# Patient Record
Sex: Female | Born: 1960
Health system: Southern US, Community
[De-identification: ages and names within clinical notes are randomized; demographics above are authoritative.]

## PROBLEM LIST (undated history)

## (undated) DIAGNOSIS — J45909 Unspecified asthma, uncomplicated: Secondary | ICD-10-CM

## (undated) DIAGNOSIS — I10 Essential (primary) hypertension: Secondary | ICD-10-CM

## (undated) DIAGNOSIS — K602 Anal fissure, unspecified: Secondary | ICD-10-CM

## (undated) DIAGNOSIS — E079 Disorder of thyroid, unspecified: Secondary | ICD-10-CM

## (undated) DIAGNOSIS — K649 Unspecified hemorrhoids: Secondary | ICD-10-CM

## (undated) DIAGNOSIS — E039 Hypothyroidism, unspecified: Secondary | ICD-10-CM

## (undated) DIAGNOSIS — Z87442 Personal history of urinary calculi: Secondary | ICD-10-CM

## (undated) DIAGNOSIS — J189 Pneumonia, unspecified organism: Secondary | ICD-10-CM

## (undated) DIAGNOSIS — Z789 Other specified health status: Secondary | ICD-10-CM

## (undated) DIAGNOSIS — R112 Nausea with vomiting, unspecified: Secondary | ICD-10-CM

## (undated) DIAGNOSIS — Z9889 Other specified postprocedural states: Secondary | ICD-10-CM

## (undated) DIAGNOSIS — M1990 Unspecified osteoarthritis, unspecified site: Secondary | ICD-10-CM

## (undated) HISTORY — DX: Unspecified osteoarthritis, unspecified site: M19.90

## (undated) HISTORY — DX: Essential (primary) hypertension: I10

## (undated) HISTORY — PX: TUBAL LIGATION: SHX77

## (undated) HISTORY — DX: Unspecified hemorrhoids: K64.9

## (undated) HISTORY — DX: Disorder of thyroid, unspecified: E07.9

## (undated) HISTORY — PX: COLONOSCOPY: SHX174

## (undated) HISTORY — DX: Anal fissure, unspecified: K60.2

---

## 1998-02-21 ENCOUNTER — Emergency Department (HOSPITAL_COMMUNITY): Admission: EM | Admit: 1998-02-21 | Discharge: 1998-02-21 | Payer: Self-pay | Admitting: Emergency Medicine

## 2001-09-10 ENCOUNTER — Other Ambulatory Visit: Admission: RE | Admit: 2001-09-10 | Discharge: 2001-09-10 | Payer: Self-pay | Admitting: Family Medicine

## 2002-06-28 ENCOUNTER — Encounter: Admission: RE | Admit: 2002-06-28 | Discharge: 2002-06-28 | Payer: Self-pay | Admitting: Family Medicine

## 2002-06-28 ENCOUNTER — Encounter: Payer: Self-pay | Admitting: Family Medicine

## 2002-08-30 ENCOUNTER — Encounter (INDEPENDENT_AMBULATORY_CARE_PROVIDER_SITE_OTHER): Payer: Self-pay

## 2002-08-30 ENCOUNTER — Inpatient Hospital Stay (HOSPITAL_COMMUNITY): Admission: RE | Admit: 2002-08-30 | Discharge: 2002-09-01 | Payer: Self-pay | Admitting: Obstetrics and Gynecology

## 2002-08-30 HISTORY — PX: TOTAL ABDOMINAL HYSTERECTOMY: SHX209

## 2002-08-30 HISTORY — PX: ABDOMINAL HYSTERECTOMY: SHX81

## 2003-04-23 ENCOUNTER — Other Ambulatory Visit: Admission: RE | Admit: 2003-04-23 | Discharge: 2003-04-23 | Payer: Self-pay | Admitting: Family Medicine

## 2004-11-04 ENCOUNTER — Other Ambulatory Visit: Admission: RE | Admit: 2004-11-04 | Discharge: 2004-11-04 | Payer: Self-pay | Admitting: Obstetrics & Gynecology

## 2006-09-13 ENCOUNTER — Encounter: Admission: RE | Admit: 2006-09-13 | Discharge: 2006-09-13 | Payer: Self-pay | Admitting: Physician Assistant

## 2007-06-11 ENCOUNTER — Ambulatory Visit: Payer: Self-pay | Admitting: Family Medicine

## 2007-06-11 DIAGNOSIS — J45901 Unspecified asthma with (acute) exacerbation: Secondary | ICD-10-CM | POA: Insufficient documentation

## 2007-06-11 DIAGNOSIS — E669 Obesity, unspecified: Secondary | ICD-10-CM | POA: Insufficient documentation

## 2007-06-11 DIAGNOSIS — J45909 Unspecified asthma, uncomplicated: Secondary | ICD-10-CM | POA: Insufficient documentation

## 2007-06-11 DIAGNOSIS — I10 Essential (primary) hypertension: Secondary | ICD-10-CM | POA: Insufficient documentation

## 2007-06-11 DIAGNOSIS — Z87448 Personal history of other diseases of urinary system: Secondary | ICD-10-CM | POA: Insufficient documentation

## 2007-06-12 ENCOUNTER — Encounter: Payer: Self-pay | Admitting: Family Medicine

## 2007-09-12 ENCOUNTER — Telehealth: Payer: Self-pay | Admitting: Family Medicine

## 2007-12-03 ENCOUNTER — Encounter: Payer: Self-pay | Admitting: Family Medicine

## 2007-12-07 ENCOUNTER — Ambulatory Visit: Payer: Self-pay | Admitting: Family Medicine

## 2007-12-07 DIAGNOSIS — E063 Autoimmune thyroiditis: Secondary | ICD-10-CM | POA: Insufficient documentation

## 2008-01-31 ENCOUNTER — Encounter: Payer: Self-pay | Admitting: Family Medicine

## 2008-03-13 ENCOUNTER — Encounter: Payer: Self-pay | Admitting: Family Medicine

## 2008-05-29 ENCOUNTER — Telehealth: Payer: Self-pay | Admitting: Family Medicine

## 2008-07-24 ENCOUNTER — Encounter: Payer: Self-pay | Admitting: Family Medicine

## 2008-10-23 ENCOUNTER — Encounter: Payer: Self-pay | Admitting: Family Medicine

## 2009-05-15 ENCOUNTER — Encounter: Payer: Self-pay | Admitting: Family Medicine

## 2009-05-19 LAB — CONVERTED CEMR LAB
HDL: 44 mg/dL
HDL: 44 mg/dL
HDL: 44 mg/dL
HDL: 44 mg/dL
LDL Cholesterol: 120 mg/dL
LDL Cholesterol: 120 mg/dL
LDL Cholesterol: 120 mg/dL
LDL Cholesterol: 120 mg/dL

## 2009-05-20 ENCOUNTER — Ambulatory Visit: Payer: Self-pay | Admitting: Family Medicine

## 2009-05-20 DIAGNOSIS — R030 Elevated blood-pressure reading, without diagnosis of hypertension: Secondary | ICD-10-CM | POA: Insufficient documentation

## 2009-05-20 DIAGNOSIS — E559 Vitamin D deficiency, unspecified: Secondary | ICD-10-CM | POA: Insufficient documentation

## 2009-05-20 DIAGNOSIS — J309 Allergic rhinitis, unspecified: Secondary | ICD-10-CM | POA: Insufficient documentation

## 2009-05-20 LAB — CONVERTED CEMR LAB

## 2009-05-22 ENCOUNTER — Encounter: Payer: Self-pay | Admitting: Family Medicine

## 2010-05-26 ENCOUNTER — Encounter (INDEPENDENT_AMBULATORY_CARE_PROVIDER_SITE_OTHER): Payer: Self-pay | Admitting: *Deleted

## 2010-05-26 ENCOUNTER — Encounter: Payer: Self-pay | Admitting: Family Medicine

## 2010-08-17 NOTE — Letter (Signed)
Summary: Results Follow up Letter  [place] [facility]  [address] [place], [place] [address]   Phone: [phone]  Fax: [phone]    05/26/2010 MRN: [id number]      Kaitlyn Kelly [address] [place], [place]  [address]     Dear [name],  The following are the results of your recent test(s):  Test         Result    Pap Smear:        Normal _____  Not Normal _____ Comments: ______________________________________________________ Cholesterol: LDL(Bad cholesterol):         Your goal is less than:         HDL (Good cholesterol):       Your goal is more than: Comments:  ______________________________________________________ Mammogram:        Normal ___x__  Not Normal _____ Comments:Repeat in 1 year  ___________________________________________________________________ Hemoccult:        Normal _____  Not normal _______ Comments:    _____________________________________________________________________ Other Tests:    We routinely do not discuss normal results over the telephone.  If you desire a copy of the results, or you have any questions about this information we can discuss them at your next office visit.   Sincerely,  Kerby Nora MD

## 2010-12-03 NOTE — Op Note (Signed)
NAME:  Kaitlyn Kelly, Kaitlyn Kelly                        ACCOUNT NO.:  [id number]   MEDICAL RECORD NO.:  [id number]                   PATIENT TYPE:  INP   LOCATION:  9136                                 FACILITY:  [facility]   PHYSICIAN:  Michelle L. Vincente Poli, M.D.            DATE OF BIRTH:  Dec 30, 1960   DATE OF PROCEDURE:  08/30/2002  DATE OF DISCHARGE:  09/01/2002                                 OPERATIVE REPORT   PREOPERATIVE DIAGNOSES:  1. Pelvic pain.  2. Menometrorrhagia.  3. History of right ovarian cyst.   POSTOPERATIVE DIAGNOSIS:  1. Pelvic pain.  2. Menometrorrhagia.  3. History of right ovarian cyst.   PROCEDURE:  Exploratory laparotomy, total abdominal hysterectomy, right  salpingo-oophorectomy, and removal of left ovarian cyst.   SURGEON:  Michelle L. Vincente Poli, M.D.   ASSISTANT:  Guy Sandifer. Henderson Cloud, M.D.   ANESTHESIA:  General.   ESTIMATED BLOOD LOSS:  200 mL.   DESCRIPTION OF PROCEDURE:  After informed consent was obtained, the patient  was taken to the operating room.  She was then intubated without difficulty.  The abdomen and vagina were prepped and draped in the usual sterile fashion.  A Foley catheter was inserted into the bladder.  A low transverse incision  was made, carried down to the fascia; the fascia scored in the midline and  extended laterally.  A Pfannenstiel incision was then developed.  The rectus  muscles were separated and the peritoneum was entered sharply.  The  peritoneal incision was then extended and a self-retaining retractor was  placed in the abdominal cavity.  The large and small bowel were placed in  the upper abdomen and packed appropriately.  Exam of the pelvis revealed a  slightly enlarged uterus.  There were no adhesions, no endometriosis seen.  There was a small corpus luteum cyst on the left ovary which was then  removed using electrocautery.  We then proceeded with the TAH/RSO in the  following fashion:  The Kelly clamps were used to elevate  the triple  pedicles and the round ligament was then suture ligated using a Vicryl  suture.  It was transected using electrocautery and the anterior and  posterior leaves of the broad ligament were then incised down to the level  of the internal os and cervix.  An avascular window was created beneath the  IP ligament on the right and a curve Heaney clamp was placed across the IP  ligament.  Careful attention was performed so that the ureter was well below  our clamps.  The pedicle was cut and suture ligated using a Vicryl suture  and then further secured using a free tie of 0 Vicryl.  We then skeletonized  the uterine artery, dissected the bladder free from the anterior surface of  the cervix, and the uterine artery was clamped on the right side at the  level of the internal os using a curved Heaney clamp.  Attention was then  turned to the left side of the pelvis where in a likewise fashion the round  ligament was then suture ligated using a Vicryl suture.  It was transected  using electrocautery and the anterior/posterior leaves of the broad ligament  were then incised.  The avascular window was created beneath the triple  pedicle.  A curved Heaney clamp was placed across this and the pedicle was  cut and suture ligated using 0 Vicryl suture.  The uterine artery was  likewise skeletonized and the curved Heaney clamp was placed across the  uterine artery at the level of the internal os.  The pedicle was cut and  suture ligated using 0 Vicryl suture.  The uterine artery clamped on the  right side; pedicle was cut and suture ligated using 0 Vicryl suture.  The  remainder of the hysterectomy was completed using straight clamps that were  placed beside the cervix, and clamping the cardinal ligament on either side  each pedicle was cut using a scalpel and then suture ligated using 0 Vicryl  suture.  Careful attention was performed to ensure that the bladder was well  below our clamps.  Once we  reached the level of the external os, curved  Heaney clamps were placed just below the cervix and the pedicles were cut,  and the remainder of the specimen was removed using scissors.  The angle  stitches were then placed using 0 Vicryl suture, and the remainder of the  cuff was closed in a running stitch using 0 Vicryl in a continuous running  locked stitch.  Irrigation was performed; hemostasis was noted.  All  pedicles were inspected and noted to be hemostatic.  The appendix was  inspected and noted to be normal.  The left ovary was transfixed to the  round ligament using a stitch of 0 Vicryl suture because it had a natural  tendency to lie on the cuff.  All instruments and laparotomy pads were  removed from the abdomen, and after all instruments were removed from the  abdominal cavity the peritoneum was closed using 0 Vicryl in continuous  running stitch, and the rectus muscles were reapproximated using the same  0 Vicryl.  The fascia was closed using 0 Vicryl in continuous running  stitch, starting at each corner and meeting in the midline.  After  irrigation of the subcutaneous layer, the subcutaneous layer was closed  using 0 plain gut suture interrupted, and the skin was closed with staples.  All sponge, lap, and instrument counts were correct x2.  The patient  tolerated the procedure well and went to the recovery room in stable  condition.                                               Michelle L. Vincente Poli, M.D.    Florestine Avers  D:  09/02/2002  T:  09/02/2002  Job:  [id number]

## 2010-12-03 NOTE — Discharge Summary (Signed)
   NAME:  VENBA, ZENNER                        ACCOUNT NO.:  [id number]   MEDICAL RECORD NO.:  [id number]                   PATIENT TYPE:  INP   LOCATION:  9136                                 [facility]ILITY:  [facility]   PHYSICIAN:  Michelle L. Vincente Poli, M.D.            DATE OF BIRTH:  02-02-1961   DATE OF ADMISSION:  08/30/2002  DATE OF DISCHARGE:  09/01/2002                                 DISCHARGE SUMMARY   HOSPITAL COURSE:  The patient is a 50 year old female who presents  preoperatively for TAH/RSO.  On the day of admission she undergoes a TAH/RSO  without incident.  She does very well postoperatively.  The patient is  discharged home on postoperative day #2.  Her postoperative day #1  hemoglobin was 10.  Throughout her postoperative stay she had good pain  control, she was voiding, and by postoperative day #2 was ambulating without  difficulty, had flatus, and was tolerating a regular diet.  She remained  afebrile with stable vital signs throughout her postoperative stay.  The  patient was discharged home on postoperative day #2.  Her staples were kept  in because there was a small amount of redness around her incision.   DISCHARGE MEDICATIONS:  She was given ciprofloxacin 250 mg b.i.d. for seven  days, Tylox and ibuprofen to take as needed for pain.   DISCHARGE INSTRUCTIONS:  1. She will follow up in the office in two days time to removal her staples     and for an incision exam.  2. She was advised no driving for one week.  3. She was advised to call if she has any nausea, vomiting, severe abdominal     pain, temperature greater than 100.5, redness or drainage or separation     from her incision site.                                               Michelle L. Vincente Poli, M.D.    Florestine Avers  D:  09/02/2002  T:  09/02/2002  Job:  [id number]

## 2011-02-17 ENCOUNTER — Ambulatory Visit: Payer: Self-pay | Admitting: Pediatrics

## 2011-03-31 ENCOUNTER — Emergency Department (HOSPITAL_COMMUNITY): Payer: 59

## 2011-03-31 ENCOUNTER — Emergency Department (HOSPITAL_COMMUNITY)
Admission: EM | Admit: 2011-03-31 | Discharge: 2011-03-31 | Disposition: A | Discharge status: Home / Self Care | Payer: 59 | Attending: Emergency Medicine | Admitting: Emergency Medicine

## 2011-03-31 DIAGNOSIS — R109 Unspecified abdominal pain: Secondary | ICD-10-CM | POA: Insufficient documentation

## 2011-03-31 DIAGNOSIS — I10 Essential (primary) hypertension: Secondary | ICD-10-CM | POA: Insufficient documentation

## 2011-03-31 DIAGNOSIS — K8020 Calculus of gallbladder without cholecystitis without obstruction: Secondary | ICD-10-CM | POA: Insufficient documentation

## 2011-03-31 DIAGNOSIS — N201 Calculus of ureter: Secondary | ICD-10-CM | POA: Insufficient documentation

## 2011-03-31 LAB — POCT I-STAT, CHEM 8
BUN: 13 mg/dL (ref 6–23)
Calcium, Ion: 1.12 mmol/L (ref 1.12–1.32)
Chloride: 107 mEq/L (ref 96–112)
HCT: 46 % (ref 36.0–46.0)
Potassium: 4.2 mEq/L (ref 3.5–5.1)
Sodium: 142 mEq/L (ref 135–145)

## 2011-03-31 LAB — URINALYSIS, ROUTINE W REFLEX MICROSCOPIC
Bilirubin Urine: NEGATIVE
Glucose, UA: NEGATIVE mg/dL
Ketones, ur: NEGATIVE mg/dL
Leukocytes, UA: NEGATIVE
Nitrite: NEGATIVE
Protein, ur: 30 mg/dL — AB
Specific Gravity, Urine: 1.022 (ref 1.005–1.030)
Urobilinogen, UA: 0.2 mg/dL (ref 0.0–1.0)
pH: 7 (ref 5.0–8.0)

## 2011-03-31 LAB — POCT PREGNANCY, URINE: Preg Test, Ur: NEGATIVE

## 2011-03-31 LAB — URINE MICROSCOPIC-ADD ON

## 2011-06-02 ENCOUNTER — Other Ambulatory Visit: Payer: Self-pay | Admitting: *Deleted

## 2011-06-02 DIAGNOSIS — E049 Nontoxic goiter, unspecified: Secondary | ICD-10-CM

## 2011-07-20 ENCOUNTER — Encounter: Payer: Self-pay | Admitting: *Deleted

## 2011-08-19 ENCOUNTER — Other Ambulatory Visit (HOSPITAL_COMMUNITY): Payer: Self-pay | Admitting: Internal Medicine

## 2011-08-19 ENCOUNTER — Other Ambulatory Visit: Payer: Self-pay | Admitting: Internal Medicine

## 2011-08-19 DIAGNOSIS — E039 Hypothyroidism, unspecified: Secondary | ICD-10-CM

## 2011-08-22 ENCOUNTER — Ambulatory Visit (HOSPITAL_COMMUNITY)
Admission: RE | Admit: 2011-08-22 | Discharge: 2011-08-22 | Disposition: A | Discharge status: Home / Self Care | Payer: 59 | Source: Ambulatory Visit | Attending: Internal Medicine | Admitting: Internal Medicine

## 2011-08-22 DIAGNOSIS — E042 Nontoxic multinodular goiter: Secondary | ICD-10-CM | POA: Insufficient documentation

## 2011-08-22 DIAGNOSIS — E039 Hypothyroidism, unspecified: Secondary | ICD-10-CM

## 2011-08-25 ENCOUNTER — Other Ambulatory Visit: Payer: Self-pay | Admitting: Internal Medicine

## 2011-08-25 DIAGNOSIS — E041 Nontoxic single thyroid nodule: Secondary | ICD-10-CM

## 2011-08-30 ENCOUNTER — Ambulatory Visit
Admission: RE | Admit: 2011-08-30 | Discharge: 2011-08-30 | Disposition: A | Discharge status: Home / Self Care | Payer: 59 | Source: Ambulatory Visit | Attending: Internal Medicine | Admitting: Internal Medicine

## 2011-08-30 ENCOUNTER — Other Ambulatory Visit (HOSPITAL_COMMUNITY)
Admission: RE | Admit: 2011-08-30 | Discharge: 2011-08-30 | Disposition: A | Discharge status: Home / Self Care | Payer: 59 | Source: Ambulatory Visit | Attending: Interventional Radiology | Admitting: Interventional Radiology

## 2011-08-30 DIAGNOSIS — E041 Nontoxic single thyroid nodule: Secondary | ICD-10-CM

## 2011-08-30 DIAGNOSIS — E049 Nontoxic goiter, unspecified: Secondary | ICD-10-CM | POA: Insufficient documentation

## 2011-09-07 ENCOUNTER — Telehealth: Payer: Self-pay | Admitting: *Deleted

## 2011-09-07 NOTE — Telephone Encounter (Signed)
This was a test

## 2012-01-06 ENCOUNTER — Encounter: Payer: Self-pay | Admitting: *Deleted

## 2012-04-27 ENCOUNTER — Other Ambulatory Visit: Payer: Self-pay | Admitting: Dermatology

## 2012-05-16 ENCOUNTER — Telehealth: Payer: Self-pay | Admitting: *Deleted

## 2012-05-16 NOTE — Telephone Encounter (Signed)
Opened as a test.

## 2012-09-01 ENCOUNTER — Other Ambulatory Visit: Payer: Self-pay

## 2012-10-11 ENCOUNTER — Other Ambulatory Visit: Payer: Self-pay | Admitting: Nurse Practitioner

## 2012-10-11 NOTE — Telephone Encounter (Signed)
test

## 2012-10-17 ENCOUNTER — Other Ambulatory Visit: Payer: Self-pay | Admitting: *Deleted

## 2012-10-23 ENCOUNTER — Other Ambulatory Visit: Payer: Self-pay | Admitting: *Deleted

## 2012-10-25 ENCOUNTER — Other Ambulatory Visit: Payer: Self-pay | Admitting: *Deleted

## 2012-10-25 DIAGNOSIS — E038 Other specified hypothyroidism: Secondary | ICD-10-CM

## 2012-10-29 ENCOUNTER — Encounter: Payer: Self-pay | Admitting: *Deleted

## 2012-10-30 ENCOUNTER — Telehealth: Payer: Self-pay | Admitting: *Deleted

## 2012-10-30 NOTE — Telephone Encounter (Signed)
Test

## 2012-10-30 NOTE — Telephone Encounter (Signed)
[email or web address]

## 2012-10-31 ENCOUNTER — Other Ambulatory Visit: Payer: Self-pay | Admitting: *Deleted

## 2012-10-31 ENCOUNTER — Other Ambulatory Visit: Payer: Self-pay | Admitting: "Endocrinology

## 2012-10-31 DIAGNOSIS — E2740 Unspecified adrenocortical insufficiency: Secondary | ICD-10-CM

## 2012-11-07 ENCOUNTER — Other Ambulatory Visit: Payer: Self-pay | Admitting: *Deleted

## 2012-11-16 ENCOUNTER — Other Ambulatory Visit: Payer: Self-pay | Admitting: *Deleted

## 2013-03-29 ENCOUNTER — Encounter: Payer: Self-pay | Admitting: Gastroenterology

## 2013-05-08 ENCOUNTER — Encounter: Payer: 59 | Admitting: Gastroenterology

## 2013-05-23 ENCOUNTER — Other Ambulatory Visit: Payer: Self-pay

## 2013-10-31 ENCOUNTER — Encounter: Payer: Self-pay | Admitting: Gastroenterology

## 2013-12-27 ENCOUNTER — Ambulatory Visit (AMBULATORY_SURGERY_CENTER): Payer: Self-pay

## 2013-12-27 VITALS — Ht 65.5 in | Wt 213.6 lb

## 2013-12-27 DIAGNOSIS — Z1211 Encounter for screening for malignant neoplasm of colon: Secondary | ICD-10-CM

## 2013-12-27 MED ORDER — NA SULFATE-K SULFATE-MG SULF 17.5-3.13-1.6 GM/177ML PO SOLN: ORAL | Status: DC

## 2013-12-27 NOTE — Progress Notes (Signed)
Per pt, no allergies to soy or egg products.Pt not taking any weight loss meds or using  O2 at home. 

## 2014-01-10 ENCOUNTER — Encounter: Payer: Self-pay | Admitting: Gastroenterology

## 2014-01-10 ENCOUNTER — Ambulatory Visit (AMBULATORY_SURGERY_CENTER): Payer: 59 | Admitting: Gastroenterology

## 2014-01-10 VITALS — BP 139/88 | HR 85 | Temp 97.2°F | Resp 17 | Ht 65.5 in | Wt 213.0 lb

## 2014-01-10 DIAGNOSIS — Z1211 Encounter for screening for malignant neoplasm of colon: Secondary | ICD-10-CM

## 2014-01-10 DIAGNOSIS — D126 Benign neoplasm of colon, unspecified: Secondary | ICD-10-CM

## 2014-01-10 MED ORDER — SODIUM CHLORIDE 0.9 % IV SOLN: 500.0000 mL | INTRAVENOUS | Status: DC

## 2014-01-10 NOTE — Op Note (Signed)
[place]  [address] [place], [address]   COLONOSCOPY PROCEDURE REPORT  PATIENT: Kaitlyn, Kelly  MR#: [id number] BIRTHDATE: 01/28/1961 , 52  yrs. old GENDER: Female ENDOSCOPIST: Inda Castle, MD REFERRED BY: PROCEDURE DATE:  01/10/2014 PROCEDURE:   Colonoscopy with biopsy First Screening Colonoscopy - Avg.  risk and is 50 yrs.  old or older Yes.  Prior Negative Screening - Now for repeat screening. N/A  History of Adenoma - Now for follow-up colonoscopy & has been > or = to 3 yrs.  N/A  Polyps Removed Today? Yes. ASA CLASS:   Class II INDICATIONS:average risk screening. MEDICATIONS: MAC sedation, administered by CRNA and Propofol (Diprivan) 280 mg IV  DESCRIPTION OF PROCEDURE:   After the risks benefits and alternatives of the procedure were thoroughly explained, informed consent was obtained.  A digital rectal exam revealed no abnormalities of the rectum.   The LB [id number]  endoscope was introduced through the anus and advanced to the terminal ileum which was intubated for a short distance. No adverse events experienced.   The quality of the prep was excellent using Suprep The instrument was then slowly withdrawn as the colon was fully examined.      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  Biopsies were taken to rule out adenoma.   Daily cecal valve was prominent with some polyploid changes.  Biopsies were taken to rule out adenoma.   The colon mucosa was otherwise normal. The mucosa appeared normal in the terminal ileum.  Retroflexed views revealed no abnormalities. The time to cecum=2 minutes 09 seconds.  Withdrawal time=9 minutes 32 seconds.  The scope was withdrawn and the procedure completed. COMPLICATIONS: There were no complications.  ENDOSCOPIC IMPRESSION: 1.   prominent ileocecal valve-rule out adenomatous changes  RECOMMENDATIONS: Await biopsy results   eSigned:  Inda Castle,  MD 01/10/2014 9:32 AM   cc: I Nena Jordan, MD   PATIENT NAME:  Kaitlyn, Kelly MR#: [id number]

## 2014-01-10 NOTE — Progress Notes (Signed)
Called to room to assist during endoscopic procedure.  Patient ID and intended procedure confirmed with present staff. Received instructions for my participation in the procedure from the performing physician.  

## 2014-01-10 NOTE — Patient Instructions (Signed)
Discharge instructions given with verbal understanding. Handout on polyps. Resume previous medications. YOU HAD AN ENDOSCOPIC PROCEDURE TODAY AT THE [facility]: Refer to the procedure report that was given to you for any specific questions about what was found during the examination.  If the procedure report does not answer your questions, please call your gastroenterologist to clarify.  If you requested that your care partner not be given the details of your procedure findings, then the procedure report has been included in a sealed envelope for you to review at your convenience later.  YOU SHOULD EXPECT: Some feelings of bloating in the abdomen. Passage of more gas than usual.  Walking can help get rid of the air that was put into your GI tract during the procedure and reduce the bloating. If you had a lower endoscopy (such as a colonoscopy or flexible sigmoidoscopy) you may notice spotting of blood in your stool or on the toilet paper. If you underwent a bowel prep for your procedure, then you may not have a normal bowel movement for a few days.  DIET: Your first meal following the procedure should be a light meal and then it is ok to progress to your normal diet.  A half-sandwich or bowl of soup is an example of a good first meal.  Heavy or fried foods are harder to digest and may make you feel nauseous or bloated.  Likewise meals heavy in dairy and vegetables can cause extra gas to form and this can also increase the bloating.  Drink plenty of fluids but you should avoid alcoholic beverages for 24 hours.  ACTIVITY: Your care partner should take you home directly after the procedure.  You should plan to take it easy, moving slowly for the rest of the day.  You can resume normal activity the day after the procedure however you should NOT DRIVE or use heavy machinery for 24 hours (because of the sedation medicines used during the test).    SYMPTOMS TO REPORT IMMEDIATELY: A  gastroenterologist can be reached at any hour.  During normal business hours, 8:30 AM to 5:00 PM Monday through Friday, call [phone].  After hours and on weekends, please call the GI answering service at [phone] who will take a message and have the physician on call contact you.   Following lower endoscopy (colonoscopy or flexible sigmoidoscopy):  Excessive amounts of blood in the stool  Significant tenderness or worsening of abdominal pains  Swelling of the abdomen that is new, acute  Fever of 100F or higher  FOLLOW UP: If any biopsies were taken you will be contacted by phone or by letter within the next 1-3 weeks.  Call your gastroenterologist if you have not heard about the biopsies in 3 weeks.  Our staff will call the home number listed on your records the next business day following your procedure to check on you and address any questions or concerns that you may have at that time regarding the information given to you following your procedure. This is a courtesy call and so if there is no answer at the home number and we have not heard from you through the emergency physician on call, we will assume that you have returned to your regular daily activities without incident.  SIGNATURES/CONFIDENTIALITY: You and/or your care partner have signed paperwork which will be entered into your electronic medical record.  These signatures attest to the fact that that the information above on your After Visit Summary has been   reviewed and is understood.  Full responsibility of the confidentiality of this discharge information lies with you and/or your care-partner. 

## 2014-01-10 NOTE — Progress Notes (Signed)
Procedure ends, to recovery, report given and VSS. 

## 2014-01-13 ENCOUNTER — Telehealth: Payer: Self-pay

## 2014-01-13 NOTE — Telephone Encounter (Signed)
Unable to leave message no voice mail set up. 

## 2014-01-20 ENCOUNTER — Encounter: Payer: Self-pay | Admitting: Gastroenterology

## 2015-07-24 MED FILL — MELOXICAM 7.5 MG TABLET: 7.5 | 90 days supply | Qty: 90 | Fill #0

## 2015-08-17 MED FILL — LEVOTHYROXINE 88 MCG TABLET: 88 | 90 days supply | Qty: 90 | Fill #3

## 2015-08-24 DIAGNOSIS — D2261 Melanocytic nevi of right upper limb, including shoulder: Secondary | ICD-10-CM | POA: Diagnosis not present

## 2015-08-24 DIAGNOSIS — D1801 Hemangioma of skin and subcutaneous tissue: Secondary | ICD-10-CM | POA: Diagnosis not present

## 2015-08-24 DIAGNOSIS — D2262 Melanocytic nevi of left upper limb, including shoulder: Secondary | ICD-10-CM | POA: Diagnosis not present

## 2015-08-24 DIAGNOSIS — D225 Melanocytic nevi of trunk: Secondary | ICD-10-CM | POA: Diagnosis not present

## 2015-09-02 DIAGNOSIS — K625 Hemorrhage of anus and rectum: Secondary | ICD-10-CM | POA: Diagnosis not present

## 2015-09-09 ENCOUNTER — Ambulatory Visit (INDEPENDENT_AMBULATORY_CARE_PROVIDER_SITE_OTHER): Payer: 59 | Admitting: Physician Assistant

## 2015-09-09 ENCOUNTER — Encounter: Payer: Self-pay | Admitting: Physician Assistant

## 2015-09-09 VITALS — BP 120/92 | HR 84 | Ht 65.25 in | Wt 215.2 lb

## 2015-09-09 DIAGNOSIS — K625 Hemorrhage of anus and rectum: Secondary | ICD-10-CM | POA: Diagnosis not present

## 2015-09-09 NOTE — Patient Instructions (Signed)

## 2015-09-09 NOTE — Progress Notes (Signed)
Thank you for sending this case to me. I have reviewed the entire note, and the outlined plan seems appropriate.  

## 2015-09-09 NOTE — Progress Notes (Signed)
Patient ID: Kaitlyn Kelly, female   DOB: 1960-12-03, 55 y.o.   MRN: [id number]   Subjective:    Patient ID: Kaitlyn Kelly, female    DOB: 23-Aug-1960, 55 y.o.   MRN: [id number]  HPI  Kaitlyn Kelly is a pleasant 55 year old white female known previously to Dr. Deatra Ina who had undergone colonoscopy in June 2015 for routine screening. She was found to have a somewhat prominent ileocecal valve with some polypoid changes. This area was biopsied and biopsies showed only reactive lymphoid aggregate no adenomatous changes. Exam was otherwise negative and no internal hemorrhoids noted. Comes in today after recent onset of rectal bleeding. She says she had an episode on 09/01/2015 noting bright red blood on the tissue after a bowel movement and blood in the commode. The following day she again had a bowel movement and saw some bright red blood mixed in with the stool and blood on the tissue. She had no associated rectal pain or discomfort and no abdominal pain. That day she had another episode of a regular-appearing bowel movement with a small amounts of bright red blood. She was seen by her PCP and rectal exam reported negative though she was Hemoccult positive. Patient says that she did see some blood mixed in with her bowel movement on a couple of occasions after that and none over the past few days. She says she has had some vague urgency feeling recently but no abdominal discomfort. Patient brought a picture with her that showed a normal-appearing bowel movement with some obvious clumps of red blood.  Hgb 14 last week per PCP  Review of Systems Pertinent positive and negative review of systems were noted in the above HPI section.  All other review of systems was otherwise negative.  Outpatient Encounter Prescriptions as of 09/09/2015  Medication Sig  . levothyroxine (SYNTHROID, LEVOTHROID) 88 MCG tablet Take 1 tablet by mouth daily.  Marland Kitchen lisinopril-hydrochlorothiazide (PRINZIDE,ZESTORETIC) 10-12.5 MG per  tablet Take 1 tablet by mouth daily. Pt is taking Lisinopril/HCTZ 10/25 daily  . [DISCONTINUED] levothyroxine (SYNTHROID, LEVOTHROID) 75 MCG tablet Take 75 mcg by mouth daily before breakfast.   No facility-administered encounter medications on file as of 09/09/2015.   Allergies  Allergen Reactions  . Sulfonamide Derivatives     rash   Patient Active Problem List   Diagnosis Date Noted  . UNSPECIFIED VITAMIN D DEFICIENCY 05/20/2009  . ALLERGIC RHINITIS 05/20/2009  . ELEVATED BLOOD PRESSURE WITHOUT DIAGNOSIS OF HYPERTENSION 05/20/2009  . HASHIMOTO'S THYROIDITIS 12/07/2007  . OBESITY, UNSPECIFIED 06/11/2007  . HYPERTENSION 06/11/2007  . ASTHMA 06/11/2007  . HEMATURIA, HX OF 06/11/2007   Social History   Social History  . Marital Status: Married    Spouse Name: N/A  . Number of Children: N/A  . Years of Education: N/A   Occupational History  . Not on file.   Social History Main Topics  . Smoking status: Never Smoker   . Smokeless tobacco: Never Used  . Alcohol Use: 0.6 - 1.2 oz/week    1-2 Glasses of wine per week  . Drug Use: No  . Sexual Activity: Not on file   Other Topics Concern  . Not on file   Social History Narrative    Kaitlyn Kelly's family history includes Diabetes in her sister; Heart disease in her mother. There is no history of Colon cancer, Esophageal cancer, Pancreatic cancer, Prostate cancer, or Rectal cancer.      Objective:    Filed Vitals:   09/09/15 0828  BP: 120/92  Pulse: 84    Physical Exam  well-developed white female in no acute distress, pleasant blood pressure 120/92 pulse 84 height 5 foot 5 weight 215, HEENT; nontraumatic, cephalic EOMI PERRLA sclera anicteric conjunctiva pink, Cardiovascular ;regular rate and rhythm with S1-S2 no murmur or gallop, Pulmonary; clear bilaterally, Abdomen; soft nontender nondistended bowel sounds are active there is no palpable mass or hepatosplenomegaly, Rectal; exam not done, Extremities ;no clubbing  cyanosis or edema skin warm dry, Neuropsych; mood and affect appropriate     Assessment & Plan:   #1 55 yo female with recent BRB mixed with BM- etiology not clear- r/o occult lesion or proctitis- pt had colonoscopy 12/2013 which was negative except for lymphoid tissue noted at IC valve #2 HTN #3 Hx thyroiditis  Plan;  PT will be scheduled for Colonoscopy with Dr Loletha Carrow. Procedure discussed in detail  with pt and she is agreeable to proceed.   Kaitlyn Abdelrahman Genia Harold PA-C 09/09/2015   Cc: Kaitlyn Kelly*

## 2015-09-17 ENCOUNTER — Ambulatory Visit (AMBULATORY_SURGERY_CENTER): Payer: 59 | Admitting: Gastroenterology

## 2015-09-17 ENCOUNTER — Encounter: Payer: Self-pay | Admitting: Gastroenterology

## 2015-09-17 VITALS — BP 122/82 | HR 73 | Temp 97.7°F | Resp 11 | Ht 65.25 in | Wt 215.0 lb

## 2015-09-17 DIAGNOSIS — K625 Hemorrhage of anus and rectum: Secondary | ICD-10-CM | POA: Diagnosis present

## 2015-09-17 DIAGNOSIS — K649 Unspecified hemorrhoids: Secondary | ICD-10-CM

## 2015-09-17 DIAGNOSIS — K648 Other hemorrhoids: Secondary | ICD-10-CM | POA: Diagnosis not present

## 2015-09-17 DIAGNOSIS — J45909 Unspecified asthma, uncomplicated: Secondary | ICD-10-CM | POA: Diagnosis not present

## 2015-09-17 DIAGNOSIS — E669 Obesity, unspecified: Secondary | ICD-10-CM | POA: Diagnosis not present

## 2015-09-17 DIAGNOSIS — I10 Essential (primary) hypertension: Secondary | ICD-10-CM | POA: Diagnosis not present

## 2015-09-17 MED ORDER — SODIUM CHLORIDE 0.9 % IV SOLN: 500.0000 mL | INTRAVENOUS | Status: DC

## 2015-09-17 NOTE — Op Note (Signed)
[place]  [address]. [place], [address]   COLONOSCOPY PROCEDURE REPORT  PATIENT: Kaitlyn Kelly, Kaitlyn Kelly  MR#: [id number] BIRTHDATE: [birth year] , 37  yrs. old GENDER: female ENDOSCOPIST: Wilfrid Lund, MD REFERRED [id number] Shamleffer, MD PROCEDURE DATE:  09/17/2015 PROCEDURE:   Colonoscopy, diagnostic  ASA CLASS:   Class I INDICATIONS:rectal bleeding. MEDICATIONS: Monitored anesthesia care, Propofol mg IV, and Lidocaine 40 mg IV  DESCRIPTION OF PROCEDURE:   After the risks benefits and alternatives of the procedure were thoroughly explained, informed consent was obtained.  The digital rectal exam revealed no abnormalities of the rectum.   The LB [id number]  endoscope was introduced through the anus and advanced to the cecum, which was identified by both the appendix and ileocecal valve. No adverse events experienced.   The quality of the prep was excellent. (Suprep was used)  The instrument was then slowly withdrawn as the colon was fully examined. Estimated blood loss is zero unless otherwise noted in this procedure report.      COLON FINDINGS: Small internal Grade I hemorrhoids were found.(appeared to be right posterior)  Retroflexed views revealed internal Grade I hemorrhoids. The time to cecum = 2.3 Withdrawal time = 6.6   The scope was withdrawn and the procedure completed. COMPLICATIONS: There were no immediate complications.  ENDOSCOPIC IMPRESSION: Small internal Grade I hemorrhoids  RECOMMENDATIONS: Trial of Preparation H suppository of recurrent bleeding. If bleeding is refractory to suppositories, hemorrhoidal banding could be performed. Repeat colonoscopy for screening in 10 years.  eSigned:  Wilfrid Lund, MD 09/17/2015 8:46 AM   cc:

## 2015-09-17 NOTE — Patient Instructions (Addendum)
YOU HAD AN ENDOSCOPIC PROCEDURE TODAY AT [facility]:   Refer to the procedure report that was given to you for any specific questions about what was found during the examination.  If the procedure report does not answer your questions, please call your gastroenterologist to clarify.  If you requested that your care partner not be given the details of your procedure findings, then the procedure report has been included in a sealed envelope for you to review at your convenience later.  YOU SHOULD EXPECT: Some feelings of bloating in the abdomen. Passage of more gas than usual.  Walking can help get rid of the air that was put into your GI tract during the procedure and reduce the bloating. If you had a lower endoscopy (such as a colonoscopy or flexible sigmoidoscopy) you may notice spotting of blood in your stool or on the toilet paper. If you underwent a bowel prep for your procedure, you may not have a normal bowel movement for a few days.  Please Note:  You might notice some irritation and congestion in your nose or some drainage.  This is from the oxygen used during your procedure.  There is no need for concern and it should clear up in a day or so.  SYMPTOMS TO REPORT IMMEDIATELY:   Following lower endoscopy (colonoscopy or flexible sigmoidoscopy):  Excessive amounts of blood in the stool  Significant tenderness or worsening of abdominal pains  Swelling of the abdomen that is new, acute  Fever of 100F or higher   For urgent or emergent issues, a gastroenterologist can be reached at any hour by calling [phone].   DIET: Your first meal following the procedure should be a small meal and then it is ok to progress to your normal diet. Heavy or fried foods are harder to digest and may make you feel nauseous or bloated.  Likewise, meals heavy in dairy and vegetables can increase bloating.  Drink plenty of fluids but you should avoid alcoholic beverages for 24 hours. Increase  the fiber in your diet. Drink plenty of water.  ACTIVITY:  You should plan to take it easy for the rest of today and you should NOT DRIVE or use heavy machinery until tomorrow (because of the sedation medicines used during the test).    FOLLOW UP: Our staff will call the number listed on your records the next business day following your procedure to check on you and address any questions or concerns that you may have regarding the information given to you following your procedure. If we do not reach you, we will leave a message.  However, if you are feeling well and you are not experiencing any problems, there is no need to return our call.  We will assume that you have returned to your regular daily activities without incident.  If any biopsies were taken you will be contacted by phone or by letter within the next 1-3 weeks.  Please call us at [phone] if you have not heard about the biopsies in 3 weeks.    SIGNATURES/CONFIDENTIALITY: You and/or your care partner have signed paperwork which will be entered into your electronic medical record.  These signatures attest to the fact that that the information above on your After Visit Summary has been reviewed and is understood.  Full responsibility of the confidentiality of this discharge information lies with you and/or your care-partner.  Thank-you for choosing [place] for your healthcare needs today.

## 2015-09-17 NOTE — Progress Notes (Signed)
Stable to RR 

## 2015-09-18 ENCOUNTER — Telehealth: Payer: Self-pay

## 2015-09-18 NOTE — Telephone Encounter (Signed)
  Follow up Call-  Call back number 09/17/2015 01/10/2014  Post procedure Call Back phone  # [phone] cell  Permission to leave phone message Yes Yes     Patient questions:  Do you have a fever, pain , or abdominal swelling? No. Pain Score  0 *  Have you tolerated food without any problems? Yes.    Have you been able to return to your normal activities? Yes.    Do you have any questions about your discharge instructions: Diet   No. Medications  No. Follow up visit  No.  Do you have questions or concerns about your Care? No.  Actions: * If pain score is 4 or above: No action needed, pain <4.

## 2015-10-15 MED FILL — LISINOPRIL-HCTZ 20-25 MG TA: 20-25 | 90 days supply | Qty: 90 | Fill #2

## 2015-11-09 MED FILL — LEVOTHYROXINE 88 MCG TABLET: 88 | 90 days supply | Qty: 90 | Fill #4

## 2015-11-09 MED FILL — MELOXICAM 7.5 MG TABLET: 7.5 | 90 days supply | Qty: 90 | Fill #1

## 2016-01-27 MED FILL — MELOXICAM 7.5 MG TABLET: 7.5 | 90 days supply | Qty: 90 | Fill #2

## 2016-01-27 MED FILL — LISINOPRIL-HCTZ 20-25 MG TA: 20-25 | 90 days supply | Qty: 90 | Fill #3

## 2016-02-03 DIAGNOSIS — J9801 Acute bronchospasm: Secondary | ICD-10-CM | POA: Diagnosis not present

## 2016-02-03 DIAGNOSIS — B349 Viral infection, unspecified: Secondary | ICD-10-CM | POA: Diagnosis not present

## 2016-02-03 DIAGNOSIS — R05 Cough: Secondary | ICD-10-CM | POA: Diagnosis not present

## 2016-02-23 ENCOUNTER — Telehealth: Payer: Self-pay

## 2016-02-23 NOTE — Telephone Encounter (Signed)
Error

## 2016-03-07 MED FILL — LEVOTHYROXINE 88 MCG TABLET: 88 | 60 days supply | Qty: 60 | Fill #5

## 2016-03-25 DIAGNOSIS — J45909 Unspecified asthma, uncomplicated: Secondary | ICD-10-CM | POA: Diagnosis not present

## 2016-03-25 DIAGNOSIS — E039 Hypothyroidism, unspecified: Secondary | ICD-10-CM | POA: Diagnosis not present

## 2016-03-25 DIAGNOSIS — Z780 Asymptomatic menopausal state: Secondary | ICD-10-CM | POA: Diagnosis not present

## 2016-03-25 DIAGNOSIS — Z0000 Encounter for general adult medical examination without abnormal findings: Secondary | ICD-10-CM | POA: Diagnosis not present

## 2016-03-25 DIAGNOSIS — J309 Allergic rhinitis, unspecified: Secondary | ICD-10-CM | POA: Diagnosis not present

## 2016-03-25 DIAGNOSIS — K219 Gastro-esophageal reflux disease without esophagitis: Secondary | ICD-10-CM | POA: Diagnosis not present

## 2016-03-25 DIAGNOSIS — I10 Essential (primary) hypertension: Secondary | ICD-10-CM | POA: Diagnosis not present

## 2016-03-25 DIAGNOSIS — M2550 Pain in unspecified joint: Secondary | ICD-10-CM | POA: Diagnosis not present

## 2016-03-25 DIAGNOSIS — E669 Obesity, unspecified: Secondary | ICD-10-CM | POA: Diagnosis not present

## 2016-03-29 DIAGNOSIS — Z0000 Encounter for general adult medical examination without abnormal findings: Secondary | ICD-10-CM | POA: Diagnosis not present

## 2016-03-29 DIAGNOSIS — E559 Vitamin D deficiency, unspecified: Secondary | ICD-10-CM | POA: Diagnosis not present

## 2016-03-29 DIAGNOSIS — M2550 Pain in unspecified joint: Secondary | ICD-10-CM | POA: Diagnosis not present

## 2016-04-08 DIAGNOSIS — J45909 Unspecified asthma, uncomplicated: Secondary | ICD-10-CM | POA: Diagnosis not present

## 2016-04-13 DIAGNOSIS — R21 Rash and other nonspecific skin eruption: Secondary | ICD-10-CM | POA: Diagnosis not present

## 2016-04-13 DIAGNOSIS — T7840XA Allergy, unspecified, initial encounter: Secondary | ICD-10-CM | POA: Diagnosis not present

## 2016-04-13 MED FILL — FAMOTIDINE 40 MG TABLET: 40 | 7 days supply | Qty: 7 | Fill #0

## 2016-04-13 MED FILL — predniSONE 10 MG TABS: 10 | 6 days supply | Qty: 21 | Fill #0

## 2016-05-09 MED FILL — LISINOPRIL-HCTZ 20-25 MG TA: 20-25 | 90 days supply | Qty: 90 | Fill #0

## 2016-05-09 MED FILL — MELOXICAM 7.5 MG TABLET: 7.5 | 90 days supply | Qty: 90 | Fill #3

## 2016-05-24 MED FILL — LEVOTHYROXINE 88 MCG TABLET: 88 | 30 days supply | Qty: 30 | Fill #0

## 2016-06-24 DIAGNOSIS — I10 Essential (primary) hypertension: Secondary | ICD-10-CM | POA: Diagnosis not present

## 2016-06-24 DIAGNOSIS — J45909 Unspecified asthma, uncomplicated: Secondary | ICD-10-CM | POA: Diagnosis not present

## 2016-06-24 DIAGNOSIS — J069 Acute upper respiratory infection, unspecified: Secondary | ICD-10-CM | POA: Diagnosis not present

## 2016-06-24 DIAGNOSIS — E039 Hypothyroidism, unspecified: Secondary | ICD-10-CM | POA: Diagnosis not present

## 2016-06-24 DIAGNOSIS — E559 Vitamin D deficiency, unspecified: Secondary | ICD-10-CM | POA: Diagnosis not present

## 2016-06-24 MED FILL — VIT D2 1.25 MG (50,000 UNIT: 1.25 MG | 28 days supply | Qty: 4 | Fill #0

## 2016-06-24 MED FILL — ADVAIR 100/50 DISKUS: 100-50 | 30 days supply | Qty: 60 | Fill #0

## 2016-07-08 DIAGNOSIS — Z1231 Encounter for screening mammogram for malignant neoplasm of breast: Secondary | ICD-10-CM | POA: Diagnosis not present

## 2016-07-14 MED FILL — VIT D2 1.25 MG (50,000 UNIT: 1.25 MG | 83 days supply | Qty: 12 | Fill #1

## 2016-07-14 MED FILL — LEVOTHYROXINE 88 MCG TABLET: 88 | 90 days supply | Qty: 90 | Fill #1

## 2016-07-25 ENCOUNTER — Telehealth: Payer: 59 | Admitting: Family

## 2016-07-25 DIAGNOSIS — J111 Influenza due to unidentified influenza virus with other respiratory manifestations: Secondary | ICD-10-CM | POA: Diagnosis not present

## 2016-07-25 MED ORDER — BENZONATATE 100 MG PO CAPS: 100.0000 mg | ORAL_CAPSULE | Freq: Three times a day (TID) | ORAL | 0 refills | Status: DC | PRN

## 2016-07-25 MED ORDER — OSELTAMIVIR PHOSPHATE 75 MG PO CAPS: 75.0000 mg | ORAL_CAPSULE | Freq: Two times a day (BID) | ORAL | 0 refills | Status: DC

## 2016-07-25 NOTE — Progress Notes (Signed)
E visit for Flu like symptoms   We are sorry that you are not feeling well.  Here is how we plan to help! Based on what you have shared with me it looks like you may have a respiratory virus that may be influenza.  Influenza or "the flu" is   an infection caused by a respiratory virus. The flu virus is highly contagious and persons who did not receive their yearly flu vaccination may "catch" the flu from close contact.  We have anti-viral medications to treat the viruses that cause this infection. They are not a "cure" and only shorten the course of the infection. These prescriptions are most effective when they are given within the first 2 days of "flu" symptoms. Antiviral medication are indicated if you have a high risk of complications from the flu. You should  also consider an antiviral medication if you are in close contact with someone who is at risk. These medications can help patients avoid complications from the flu  but have side effects that you should know. Possible side effects from Tamiflu or oseltamivir include nausea, vomiting, diarrhea, dizziness, headaches, eye redness, sleep problems or other respiratory symptoms. You should not take Tamiflu if you have an allergy to oseltamivir or any to the ingredients in Tamiflu.  Based upon your symptoms and potential risk factors I have prescribed Oseltamivir (Tamiflu).  It has been sent to your designated pharmacy.  You will take one 75 mg capsule orally twice a day for the next 5 days.   I have refilled your Tessalon Perles, take 1-2 capsules every 8hrs as needed for cough.   ANYONE WHO HAS FLU SYMPTOMS SHOULD: . Stay home. The flu is highly contagious and going out or to work exposes others! . Be sure to drink plenty of fluids. Water is fine as well as fruit juices, sodas and electrolyte beverages. You may want to stay away from caffeine or alcohol. If you are nauseated, try taking small sips of liquids. How do you know if you are getting  enough fluid? Your urine should be a pale yellow or almost colorless. . Get rest. . Taking a steamy shower or using a humidifier may help nasal congestion and ease sore throat pain. Using a saline nasal spray works much the same way. . Cough drops, hard candies and sore throat lozenges may ease your cough. . Line up a caregiver. Have someone check on you regularly.   GET HELP RIGHT AWAY IF: . You cannot keep down liquids or your medications. . You become short of breath . Your fell like you are going to pass out or loose consciousness. . Your symptoms persist after you have completed your treatment plan MAKE SURE YOU   Understand these instructions.  Will watch your condition.  Will get help right away if you are not doing well or get worse.  Your e-visit answers were reviewed by a board certified advanced clinical practitioner to complete your personal care plan.  Depending on the condition, your plan could have included both over the counter or prescription medications.  If there is a problem please reply  once you have received a response from your provider.  Your safety is important to [place].  If you have drug allergies check your prescription carefully.    You can use MyChart to ask questions about today's visit, request a non-urgent call back, or ask for a work or school excuse for 24 hours related to this e-Visit. If it has been  greater than 24 hours you will need to follow up with your provider, or enter a new e-Visit to address those concerns.  You will get an e-mail in the next two days asking about your experience.  I hope that your e-visit has been valuable and will speed your recovery. Thank you for using e-visits.

## 2016-07-29 DIAGNOSIS — J069 Acute upper respiratory infection, unspecified: Secondary | ICD-10-CM | POA: Diagnosis not present

## 2016-07-29 DIAGNOSIS — J45909 Unspecified asthma, uncomplicated: Secondary | ICD-10-CM | POA: Diagnosis not present

## 2016-08-08 MED FILL — ADVAIR 100/50 DISKUS: 100-50 | 30 days supply | Qty: 60 | Fill #1

## 2016-08-15 MED FILL — LISINOPRIL-HCTZ 20-25 MG TA: 20-25 | 90 days supply | Qty: 90 | Fill #1

## 2016-09-30 DIAGNOSIS — E559 Vitamin D deficiency, unspecified: Secondary | ICD-10-CM | POA: Diagnosis not present

## 2016-09-30 DIAGNOSIS — E039 Hypothyroidism, unspecified: Secondary | ICD-10-CM | POA: Diagnosis not present

## 2016-09-30 DIAGNOSIS — M1990 Unspecified osteoarthritis, unspecified site: Secondary | ICD-10-CM | POA: Diagnosis not present

## 2016-09-30 DIAGNOSIS — N951 Menopausal and female climacteric states: Secondary | ICD-10-CM | POA: Diagnosis not present

## 2016-09-30 MED FILL — GABAPENTIN 100 MG CAPSULE: 100 | 30 days supply | Qty: 90 | Fill #0

## 2016-11-07 MED FILL — ADVAIR 100/50 DISKUS: 100-50 | 30 days supply | Qty: 60 | Fill #2

## 2016-11-07 MED FILL — LISINOPRIL-HCTZ 20-25 MG TA: 20-25 | 90 days supply | Qty: 90 | Fill #2

## 2016-11-07 MED FILL — LEVOTHYROXINE 88 MCG TABLET: 88 | 90 days supply | Qty: 90 | Fill #2

## 2017-02-06 MED FILL — LISINOPRIL-HCTZ 20-25 MG TA: 20-25 | 90 days supply | Qty: 90 | Fill #3

## 2017-02-06 MED FILL — LEVOTHYROXINE 88 MCG TABLET: 88 | 90 days supply | Qty: 90 | Fill #3

## 2017-03-22 ENCOUNTER — Telehealth: Payer: Self-pay | Admitting: Gastroenterology

## 2017-03-23 NOTE — Telephone Encounter (Signed)
Left message for pt to call back  °

## 2017-03-29 NOTE — Telephone Encounter (Signed)
Called patient again, left message that I was returning her call and if she has questions or concerns about hemorrhoid banding procedure to please call back.

## 2017-04-04 ENCOUNTER — Telehealth: Payer: Self-pay | Admitting: Gastroenterology

## 2017-04-04 NOTE — Telephone Encounter (Signed)
Spoke to patient, she is tired of dealing with her hemorrhoids and wishes to proceed with banding. Scheduled for her first banding on 10/5.

## 2017-04-13 DIAGNOSIS — K219 Gastro-esophageal reflux disease without esophagitis: Secondary | ICD-10-CM | POA: Diagnosis not present

## 2017-04-13 DIAGNOSIS — I10 Essential (primary) hypertension: Secondary | ICD-10-CM | POA: Diagnosis not present

## 2017-04-13 DIAGNOSIS — J309 Allergic rhinitis, unspecified: Secondary | ICD-10-CM | POA: Diagnosis not present

## 2017-04-13 DIAGNOSIS — Z0000 Encounter for general adult medical examination without abnormal findings: Secondary | ICD-10-CM | POA: Diagnosis not present

## 2017-04-13 DIAGNOSIS — J45909 Unspecified asthma, uncomplicated: Secondary | ICD-10-CM | POA: Diagnosis not present

## 2017-04-13 DIAGNOSIS — N951 Menopausal and female climacteric states: Secondary | ICD-10-CM | POA: Diagnosis not present

## 2017-04-13 DIAGNOSIS — E039 Hypothyroidism, unspecified: Secondary | ICD-10-CM | POA: Diagnosis not present

## 2017-04-13 DIAGNOSIS — M1990 Unspecified osteoarthritis, unspecified site: Secondary | ICD-10-CM | POA: Diagnosis not present

## 2017-04-13 DIAGNOSIS — E559 Vitamin D deficiency, unspecified: Secondary | ICD-10-CM | POA: Diagnosis not present

## 2017-04-21 ENCOUNTER — Ambulatory Visit (INDEPENDENT_AMBULATORY_CARE_PROVIDER_SITE_OTHER): Payer: 59 | Admitting: Gastroenterology

## 2017-04-21 ENCOUNTER — Encounter: Payer: Self-pay | Admitting: Gastroenterology

## 2017-04-21 VITALS — BP 116/78 | HR 96 | Ht 65.25 in | Wt 196.2 lb

## 2017-04-21 DIAGNOSIS — K641 Second degree hemorrhoids: Secondary | ICD-10-CM

## 2017-04-21 DIAGNOSIS — K640 First degree hemorrhoids: Secondary | ICD-10-CM

## 2017-04-21 DIAGNOSIS — Z0000 Encounter for general adult medical examination without abnormal findings: Secondary | ICD-10-CM | POA: Diagnosis not present

## 2017-04-21 DIAGNOSIS — E038 Other specified hypothyroidism: Secondary | ICD-10-CM | POA: Diagnosis not present

## 2017-04-21 DIAGNOSIS — E559 Vitamin D deficiency, unspecified: Secondary | ICD-10-CM | POA: Diagnosis not present

## 2017-04-21 DIAGNOSIS — Z136 Encounter for screening for cardiovascular disorders: Secondary | ICD-10-CM | POA: Diagnosis not present

## 2017-04-21 DIAGNOSIS — K625 Hemorrhage of anus and rectum: Secondary | ICD-10-CM

## 2017-04-21 DIAGNOSIS — I10 Essential (primary) hypertension: Secondary | ICD-10-CM | POA: Diagnosis not present

## 2017-04-21 NOTE — Progress Notes (Signed)
PROCEDURE NOTE: The patient presents with symptomatic grade 2 hemorrhoids, requesting rubber band ligation of his/her hemorrhoidal disease. All risks, benefits and alternative forms of therapy were described and informed consent was obtained.  DRE revealed: normal  Anoscopy - 3 columns int HR   The anorectum was pre-medicated with 0.125% NTG and lubricant. The decision was made to band the RP internal hemorrhoids, and the [place] was used to perform band ligation without complication. Digital anorectal examination was then performed to assure proper positioning of the band, and to adjust the banded tissue as required. The patient was discharged home without pain or other issues. Dietary and behavioral recommendations were given and along with follow-up instructions.   The following adjunctive treatments were recommended:  none  The patient will return 2-3  for follow-up and possible additional banding as required. No complications were encountered and the patient tolerated the procedure well.

## 2017-04-21 NOTE — Patient Instructions (Signed)
If you are age 55 or older, your body mass index should be between 23-30. Your Body mass index is 32.41 kg/m. If this is out of the aforementioned range listed, please consider follow up with your Primary Care Provider.  If you are age 12 or younger, your body mass index should be between 19-25. Your Body mass index is 32.41 kg/m. If this is out of the aformentioned range listed, please consider follow up with your Primary Care Provider.   HEMORRHOID BANDING PROCEDURE    FOLLOW-UP CARE   1. The procedure you have had should have been relatively painless since the banding of the area involved does not have nerve endings and there is no pain sensation.  The rubber band cuts off the blood supply to the hemorrhoid and the band may fall off as soon as 48 hours after the banding (the band may occasionally be seen in the toilet bowl following a bowel movement). You may notice a temporary feeling of fullness in the rectum which should respond adequately to plain Tylenol or Motrin.  2. Following the banding, avoid strenuous exercise that evening and resume full activity the next day.  A sitz bath (soaking in a warm tub) or bidet is soothing, and can be useful for cleansing the area after bowel movements.     3. To avoid constipation, take two tablespoons of natural wheat bran, natural oat bran, flax, Benefiber or any over the counter fiber supplement and increase your water intake to 7-8 glasses daily.    4. Unless you have been prescribed anorectal medication, do not put anything inside your rectum for two weeks: No suppositories, enemas, fingers, etc.  5. Occasionally, you may have more bleeding than usual after the banding procedure.  This is often from the untreated hemorrhoids rather than the treated one.  Don't be concerned if there is a tablespoon or so of blood.  If there is more blood than this, lie flat with your bottom higher than your head and apply an ice pack to the area. If the bleeding  does not stop within a half an hour or if you feel faint, call our office at [phone] or go to the emergency room.  6. Problems are not common; however, if there is a substantial amount of bleeding, severe pain, chills, fever or difficulty passing urine (very rare) or other problems, you should call us at [phone] or report to the nearest emergency room.  7. Do not stay seated continuously for more than 2-3 hours for a day or two after the procedure.  Tighten your buttock muscles 10-15 times every two hours and take 10-15 deep breaths every 1-2 hours.  Do not spend more than a few minutes on the toilet if you cannot empty your bowel; instead re-visit the toilet at a later time.   Thank you for choosing [facility]  Dr Wilfrid Lund III

## 2017-05-11 MED FILL — LEVOTHYROXINE 88 MCG TABLET: 88 | 90 days supply | Qty: 90 | Fill #0

## 2017-05-11 MED FILL — LISINOPRIL-HCTZ 20-25 MG TA: 20-25 | 90 days supply | Qty: 90 | Fill #0

## 2017-05-31 MED FILL — VIT D2 1.25 MG (50,000 UNIT: 1.25 MG | 56 days supply | Qty: 8 | Fill #2

## 2017-05-31 MED FILL — ADVAIR 100/50 DISKUS: 100-50 | 30 days supply | Qty: 60 | Fill #3

## 2017-06-16 ENCOUNTER — Encounter: Payer: 59 | Admitting: Gastroenterology

## 2017-06-30 DIAGNOSIS — E039 Hypothyroidism, unspecified: Secondary | ICD-10-CM | POA: Diagnosis not present

## 2017-07-03 MED FILL — LEVOTHYROXINE 100 MCG TABLE: 100 | 90 days supply | Qty: 90 | Fill #0

## 2017-07-13 DIAGNOSIS — Z1231 Encounter for screening mammogram for malignant neoplasm of breast: Secondary | ICD-10-CM | POA: Diagnosis not present

## 2017-10-05 MED FILL — LEVOTHYROXINE 100 MCG TABLE: 100 | 90 days supply | Qty: 90 | Fill #1

## 2017-10-05 MED FILL — LISINOPRIL-HCTZ 20-25 MG TA: 20-25 | 90 days supply | Qty: 90 | Fill #0

## 2018-01-08 ENCOUNTER — Telehealth: Payer: Self-pay | Admitting: Gastroenterology

## 2018-01-08 NOTE — Telephone Encounter (Signed)
Spoke to patient she is having hemorrhoidal pain, some bleeding. She is currently taking a stool softener, Sitz baths. She has an appointment to see her PCP tomorrow, I have also put her in to see one of the APP at our next available appointment (6/27).

## 2018-01-09 MED FILL — PROCTOFOAM-HC 1%-1% FOAM: 1-1 | 10 days supply | Qty: 10 | Fill #0

## 2018-01-11 ENCOUNTER — Ambulatory Visit: Payer: No Typology Code available for payment source | Admitting: Nurse Practitioner

## 2018-01-11 ENCOUNTER — Encounter: Payer: Self-pay | Admitting: Nurse Practitioner

## 2018-01-11 VITALS — BP 116/76 | HR 102 | Ht 66.0 in | Wt 207.4 lb

## 2018-01-11 DIAGNOSIS — K6289 Other specified diseases of anus and rectum: Secondary | ICD-10-CM

## 2018-01-11 MED ORDER — DILTIAZEM GEL 2 %: 1.0000 "application " | Freq: Three times a day (TID) | CUTANEOUS | 1 refills | Status: DC

## 2018-01-11 NOTE — Patient Instructions (Addendum)
If you are age 56 or older, your body mass index should be between 23-30. Your Body mass index is 33.48 kg/m. If this is out of the aforementioned range listed, please consider follow up with your Primary Care Provider.  If you are age 52 or younger, your body mass index should be between 19-25. Your Body mass index is 33.48 kg/m. If this is out of the aformentioned range listed, please consider follow up with your Primary Care Provider.   We have sent the following medications to [facility] for you to pick up at your convenience: Diltiazem Gel  Use Recticare as needed.  (Over-the-counter)  Call if getting any worse.  Follow up with me on February 02, 2018 at 3 pm.  Thank you for choosing me and [facility].   Tye Savoy, NP

## 2018-01-11 NOTE — Progress Notes (Signed)
      IMPRESSION and PLAN:    #1. 57 yo female with severe rectal pain, worse with defecation. Anterior midline erythematous, edematous and markedly tender. Suspect fissure. Couldn't adequate evaluate do to severe pain.  -Diltiazem gel 2% TID x 6-8 weeks -recticare TID as needed for pain -sitz baths TID -avoid straining / keeps stools soft (says they are).  -follow up with me in 3-4 weeks, or sooner if not improving     HPI:    Chief Complaint: rectal pain   Patient is a 57 yo female known to Dr. Loletha Carrow. She underwent internal banding Oct 2018 and developed horrible pain after getting home. Pain last 3 days. She decided not to come back for additional banding. She has continued to have intermittent rectal bleeding. A week and a half ago she developed severe rectal pain with defecation. Started stool softeners and stools soft but still in severe pain. Even sitting hurts. Intercourse uncomfortable as well. No vaginal sx  Review of systems:     No chest pain, no SOB, no fevers, no urinary sx.   Past Medical History:  Diagnosis Date  . Arthritis   . Hemorrhoids   . Hypertension   . Thyroid disease     Patient's surgical history, family medical history, social history, medications and allergies were all reviewed in Epic   Creatinine clearance cannot be calculated (Patient's most recent lab result is older than the maximum 21 days allowed.)   Physical Exam:     BP 116/76   Pulse (!) 102   Ht 5\' 6"  (1.676 m)   Wt 207 lb 6.4 oz (94.1 kg)   BMI 33.48 kg/m   GENERAL:  Pleasant female in NAD ABDOMEN:  Nondistended, soft, nontender. No obvious masses, no hepatomegaly,  normal bowel sounds SKIN:  turgor, no lesions seen Abdomen: soft, ND, non-tender. Active bowel sounds Rectal : fleshy hemorrhoid tag anterior midline. Anterior midline slightly edematous, erythematous and significant tender.  NEURO: Alert and oriented x 3, no focal neurologic deficits   Tye Savoy , NP  01/11/2018, 3:39 PM

## 2018-01-13 ENCOUNTER — Encounter: Payer: Self-pay | Admitting: Nurse Practitioner

## 2018-01-15 NOTE — Progress Notes (Signed)
Thank you for sending this case to me. I have reviewed the entire note, and the outlined plan seems appropriate.  It does sound like an anal fissure. Since pain the prominent symptom, recticare most important.  If not improving as expected, change to NTG ointment. Please be sure she sees one of [place] in a few weeks to make sure improving.  Wilfrid Lund, MD

## 2018-01-22 MED FILL — LISINOPRIL-HCTZ 20-25 MG TA: 20-25 | 90 days supply | Qty: 90 | Fill #0

## 2018-01-22 MED FILL — LEVOTHYROXINE 100 MCG TABLE: 100 | 90 days supply | Qty: 90 | Fill #0

## 2018-02-02 ENCOUNTER — Ambulatory Visit: Payer: No Typology Code available for payment source | Admitting: Nurse Practitioner

## 2018-02-02 ENCOUNTER — Encounter: Payer: Self-pay | Admitting: Nurse Practitioner

## 2018-02-02 VITALS — BP 104/64 | HR 79 | Ht 65.25 in | Wt 206.0 lb

## 2018-02-02 DIAGNOSIS — K602 Anal fissure, unspecified: Secondary | ICD-10-CM

## 2018-02-02 NOTE — Progress Notes (Signed)
      IMPRESSION and PLAN:     Anterior midline anal fissure, improving with stool softeners, Diltiazem Gel. Still some bleeding and discomfort but overall better -Continue Diltiazem Gel for another 4-5 weeks -continue stool softener -continue recticare as needed  -call if symptoms don't totally resolve within 6 weeks.Marland Kitchen      HPI:    Chief Complaint:  Follow up anal fissure   Patient is a back for brief follow up of what is presumed to be an anal fissure. I saw her a few weeks ago with anorectal pain and bleeding. Story and exam most c/w anal fissure but there was swelling at anterior midline so definite diagnosis couldn't be made. She was started on stool softeners, recticare and Diltiazem gel. Here for brief follow up.   She is still have some bleeding with BM. Pain associated with defecation has gotten better and she can sit without being in pain.  Still some discomfort with intercourse  Review of systems:     No chest pain, no SOB, no fevers, no urinary sx   Past Medical History:  Diagnosis Date  . Anal fissure   . Arthritis   . Hemorrhoids   . Hypertension   . Thyroid disease     Patient's surgical history, family medical history, social history, medications and allergies were all reviewed in Epic   Creatinine clearance cannot be calculated (Patient's most recent lab result is older than the maximum 21 days allowed.)   Physical Exam:     BP 104/64   Pulse 79   Ht 5' 5.25" (1.657 m)   Wt 206 lb (93.4 kg)   BMI 34.02 kg/m   GENERAL:  Pleasant female in NAD PSYCH: : Cooperative, normal affect NEURO: Alert and oriented x 3, no focal neurologic deficits Rectal : still some swelling / heaped up tissue at anterior midline where a fissure with scant bleeding is clearly seen  Tye Savoy , NP 02/02/2018, 3:49 PM

## 2018-02-02 NOTE — Patient Instructions (Signed)
If you are age 56 or older, your body mass index should be between 23-30. Your Body mass index is 34.02 kg/m. If this is out of the aforementioned range listed, please consider follow up with your Primary Care Provider.  If you are age 38 or younger, your body mass index should be between 19-25. Your Body mass index is 34.02 kg/m. If this is out of the aformentioned range listed, please consider follow up with your Primary Care Provider.   Follow up as needed.  Thank you for choosing me and [facility].   Tye Savoy, NP

## 2018-02-04 NOTE — Progress Notes (Signed)
Thank you for sending this case to me. I have reviewed the entire note, and the outlined plan seems appropriate.   Henry Danis, MD  

## 2018-04-19 MED FILL — LEVOTHYROXINE 100 MCG TABLE: 100 | 90 days supply | Qty: 90 | Fill #1

## 2018-04-19 MED FILL — LISINOPRIL-HCTZ 20-25 MG TA: 20-25 | 90 days supply | Qty: 90 | Fill #0

## 2018-04-25 MED FILL — VIT D2 1.25 MG (50,000 UNIT: 1.25 MG | 84 days supply | Qty: 12 | Fill #0

## 2018-11-08 MED FILL — LISINOPRIL-HCTZ 20-25 MG TA: 20-25 | 90 days supply | Qty: 90 | Fill #0

## 2018-11-08 MED FILL — LEVOTHYROXINE 100 MCG TAB: 100 | 90 days supply | Qty: 90 | Fill #0

## 2019-01-02 MED FILL — METOPROLOL SUCCINATE ER 50: 50 | 30 days supply | Qty: 30 | Fill #0

## 2019-01-02 MED FILL — HYDROCHLOROTHIAZIDE 12.5 MG: 12.5 | 30 days supply | Qty: 30 | Fill #0

## 2019-01-08 MED FILL — VIT D2 1.25 MG (50,000 UNIT: 1.25 MG | 84 days supply | Qty: 12 | Fill #0

## 2019-01-24 ENCOUNTER — Other Ambulatory Visit: Payer: Self-pay

## 2019-01-24 ENCOUNTER — Ambulatory Visit (INDEPENDENT_AMBULATORY_CARE_PROVIDER_SITE_OTHER): Payer: No Typology Code available for payment source | Admitting: Neurology

## 2019-01-24 DIAGNOSIS — G5602 Carpal tunnel syndrome, left upper limb: Secondary | ICD-10-CM

## 2019-01-24 DIAGNOSIS — M79642 Pain in left hand: Secondary | ICD-10-CM

## 2019-01-24 NOTE — Procedures (Signed)
[facility]  [address], [address]  [place], [place] [address] Tel: [phone] Fax:  [phone] Test Date:  01/24/2019  Patient: Kaitlyn Kelly DOB: 09/01/1960 Physician: Narda Amber, DO  Sex: Female Height: 5\' 5"  Ref Phys: Leanora Cover, MD  ID#: [id number] Temp: 32.4C Technician:    Patient Complaints: This is a 58 year old female referred for evaluation of left hand numbness, tingling, and pain.  NCV & EMG Findings: Extensive electrodiagnostic testing of the left upper extremity and additional studies of the left shows:  1. Bilateral median, ulnar, and mixed palmar sensory responses are within normal limits. 2. Bilateral median and ulnar motor responses are within normal limits. 3. There is no evidence of active or chronic motor axonal loss changes affecting any of the tested muscles.  Motor unit configuration and recruitment pattern is within normal limits.  Impression: This is a normal study of the upper extremities.  In particular, there is no evidence of carpal tunnel syndrome or a left cervical radiculopathy.   ___________________________ Narda Amber, DO    Nerve Conduction Studies Anti Sensory Summary Table   Site NR Peak (ms) Norm Peak (ms) P-T Amp (V) Norm P-T Amp  Left Median Anti Sensory (2nd Digit)  32.4C  Wrist    3.1 <3.6 58.1 >15  Right Median Anti Sensory (2nd Digit)  32.4C  Wrist    2.9 <3.6 43.5 >15  Left Ulnar Anti Sensory (5th Digit)  32.4C  Wrist    2.8 <3.1 42.8 >10  Right Ulnar Anti Sensory (5th Digit)  32.4C  Wrist    2.4 <3.1 40.7 >10   Motor Summary Table   Site NR Onset (ms) Norm Onset (ms) O-P Amp (mV) Norm O-P Amp Site1 Site2 Delta-0 (ms) Dist (cm) Vel (m/s) Norm Vel (m/s)  Left Median Motor (Abd Poll Brev)  32.4C  Wrist    3.2 <4.0 9.8 >6 Elbow Wrist 4.4 29.0 66 >50  Elbow    7.6  9.2         Right Median Motor (Abd Poll Brev)  32.4C  Wrist    2.9 <4.0 10.0 >6 Elbow Wrist 4.2 26.0 62 >50  Elbow    7.1  9.9          Left Ulnar Motor (Abd Dig Minimi)  32.4C  Wrist    2.5 <3.1 10.2 >7 B Elbow Wrist 3.4 22.0 65 >50  B Elbow    5.9  9.9  A Elbow B Elbow 1.6 10.0 63 >50  A Elbow    7.5  9.7         Right Ulnar Motor (Abd Dig Minimi)  32.4C  Wrist    2.3 <3.1 12.2 >7 B Elbow Wrist 3.3 22.0 67 >50  B Elbow    5.6  11.3  A Elbow B Elbow 1.5 10.0 67 >50  A Elbow    7.1  11.1          Comparison Summary Table   Site NR Peak (ms) Norm Peak (ms) P-T Amp (V) Site1 Site2 Delta-P (ms) Norm Delta (ms)  Left Median/Ulnar Palm Comparison (Wrist - 8cm)  32.4C  Median Palm    1.7 <2.2 76.0 Median Palm Ulnar Palm 0.1   Ulnar Palm    1.6 <2.2 28.9      Right Median/Ulnar Palm Comparison (Wrist - 8cm)  32.4C  Median Palm    1.7 <2.2 53.6 Median Palm Ulnar Palm 0.3   Ulnar Palm    1.4 <2.2 30.1  EMG   Side Muscle Ins Act Fibs Psw Fasc Number Recrt Dur Dur. Amp Amp. Poly Poly. Comment  Left 1stDorInt Nml Nml Nml Nml Nml Nml Nml Nml Nml Nml Nml Nml N/A  Left PronatorTeres Nml Nml Nml Nml Nml Nml Nml Nml Nml Nml Nml Nml N/A  Left Biceps Nml Nml Nml Nml Nml Nml Nml Nml Nml Nml Nml Nml N/A  Left Triceps Nml Nml Nml Nml Nml Nml Nml Nml Nml Nml Nml Nml N/A  Left Deltoid Nml Nml Nml Nml Nml Nml Nml Nml Nml Nml Nml Nml N/A      Waveforms:

## 2019-11-18 ENCOUNTER — Other Ambulatory Visit (HOSPITAL_COMMUNITY): Payer: Self-pay | Admitting: Family Medicine

## 2020-01-07 ENCOUNTER — Other Ambulatory Visit (HOSPITAL_COMMUNITY): Payer: Self-pay | Admitting: Family Medicine

## 2020-01-17 ENCOUNTER — Ambulatory Visit (INDEPENDENT_AMBULATORY_CARE_PROVIDER_SITE_OTHER): Payer: No Typology Code available for payment source | Admitting: Orthopaedic Surgery

## 2020-01-17 ENCOUNTER — Ambulatory Visit: Payer: Self-pay

## 2020-01-17 ENCOUNTER — Other Ambulatory Visit: Payer: Self-pay

## 2020-01-17 ENCOUNTER — Encounter: Payer: Self-pay | Admitting: Orthopaedic Surgery

## 2020-01-17 VITALS — Ht 65.25 in | Wt 206.0 lb

## 2020-01-17 DIAGNOSIS — M79642 Pain in left hand: Secondary | ICD-10-CM | POA: Diagnosis not present

## 2020-01-17 DIAGNOSIS — M79641 Pain in right hand: Secondary | ICD-10-CM

## 2020-01-17 MED ORDER — MELOXICAM 7.5 MG PO TABS: 7.5000 mg | ORAL_TABLET | Freq: Two times a day (BID) | ORAL | 2 refills | Status: DC | PRN

## 2020-01-17 NOTE — Progress Notes (Signed)
Office Visit Note   Patient: Kaitlyn Kelly           Date of Birth: [birth year]           MRN: [id number] Visit Date: 01/17/2020              Requested by: Kelton Pillar, MD [address] [place],  [place] [address] PCP: Kelton Pillar, MD   Assessment & Plan: Visit Diagnoses:  1. Pain in both hands     Plan: Impression is bilateral hand osteoarthritis.  I have sent in a prescription for meloxicam in case she is interested.  She will continue use Voltaren gel as needed.  She declined braces for the basal joint arthritis.  I reviewed her recent lab work which was all negative for inflammatory arthritis follow-up as needed.  Follow-Up Instructions: Return if symptoms worsen or fail to improve.   Orders:  Orders Placed This Encounter  Procedures  . XR Hand Complete Left  . XR Hand Complete Right   Meds ordered this encounter  Medications  . meloxicam (MOBIC) 7.5 MG tablet    Sig: Take 1 tablet (7.5 mg total) by mouth 2 (two) times daily as needed for pain.    Dispense:  30 tablet    Refill:  2  . meloxicam (MOBIC) 7.5 MG tablet    Sig: Take 1 tablet (7.5 mg total) by mouth 2 (two) times daily as needed for pain.    Dispense:  30 tablet    Refill:  2      Procedures: No procedures performed   Clinical Data: No additional findings.   Subjective: Chief Complaint  Patient presents with  . Right Hand - Pain, New Patient (Initial Visit)  . 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  She is a referral from PCP mainly to evaluate for inflammatory arthritis.  She has noticed a worsening hand pain in the right.  She is in chronic pain that has gotten worse.  She feels like some of the fingers are starting to deform.  Denies any injuries.  Denies any numbness and tingling.  She has some pain in the base of the thumb as well.  Currently using Voltaren gel  without significant relief.  She has tried braces for the basal joint arthritis.   Review of Systems  Constitutional: Negative.   HENT: Negative.   Eyes: Negative.   Respiratory: Negative.   Cardiovascular: Negative.   Endocrine: Negative.   Musculoskeletal: Negative.   Neurological: Negative.   Hematological: Negative.   Psychiatric/Behavioral: Negative.   All other systems reviewed and are negative.    Objective: Vital Signs: Ht 5' 5.25" (1.657 m)   Wt 206 lb (93.4 kg)   BMI 34.02 kg/m   Physical Exam Vitals and nursing note reviewed.  Constitutional:      Appearance: She is well-developed.  HENT:     Head: Normocephalic and atraumatic.  Pulmonary:     Effort: Pulmonary effort is normal.  Abdominal:     Palpations: Abdomen is soft.  Musculoskeletal:     Cervical back: Neck supple.  Skin:    General: Skin is warm.     Capillary Refill: Capillary refill takes less than 2 seconds.  Neurological:     Mental Status: She is alert and oriented to person, place, and time.  Psychiatric:  Behavior: Behavior normal.        Thought Content: Thought content normal.        Judgment: Judgment normal.     Ortho Exam bilateral hands show Heberden and Bouchard nodes.  MP joints are nontender.  There is no soft tissue swelling.  Positive grind test bilaterally. Specialty Comments:  No specialty comments available.  Imaging: XR Hand Complete Left  Result Date: 01/17/2020 Diffuse osteoarthritis of the DIP and PIP joints.  Basal joint arthritis as well.  XR Hand Complete Right  Result Date: 01/17/2020 Diffuse osteoarthritis of DIP and PIP joints.  Basal joint arthritis as well    PMFS History: Patient Active Problem List   Diagnosis Date Noted  . Pain in both hands 01/17/2020  . UNSPECIFIED VITAMIN D DEFICIENCY 05/20/2009  . ALLERGIC RHINITIS 05/20/2009  . ELEVATED BLOOD PRESSURE WITHOUT DIAGNOSIS OF HYPERTENSION 05/20/2009  . HASHIMOTO'S THYROIDITIS 12/07/2007    . OBESITY, UNSPECIFIED 06/11/2007  . HYPERTENSION 06/11/2007  . ASTHMA 06/11/2007  . HEMATURIA, HX OF 06/11/2007   Past Medical History:  Diagnosis Date  . Anal fissure   . Arthritis   . Hemorrhoids   . Hypertension   . Thyroid disease     Family History  Problem Relation Age of Onset  . Heart disease Mother   . Diabetes Sister   . Colon cancer Neg Hx   . Esophageal cancer Neg Hx   . Pancreatic cancer Neg Hx   . Prostate cancer Neg Hx   . Rectal cancer Neg Hx     Past Surgical History:  Procedure Laterality Date  . ABDOMINAL HYSTERECTOMY Left 08/30/2002   normal  . COLONOSCOPY     had a 2014, 2017   Social History   Occupational History  . Not on file  Tobacco Use  . Smoking status: Never Smoker  . Smokeless tobacco: Never Used  Vaping Use  . Vaping Use: Never used  Substance and Sexual Activity  . Alcohol use: Yes    Alcohol/week: 1.0 - 2.0 standard drink    Types: 1 - 2 Glasses of wine per week  . Drug use: No  . Sexual activity: Not on file

## 2020-03-02 ENCOUNTER — Encounter: Payer: Self-pay | Admitting: Nurse Practitioner

## 2020-03-02 ENCOUNTER — Other Ambulatory Visit: Payer: Self-pay

## 2020-03-02 ENCOUNTER — Ambulatory Visit (INDEPENDENT_AMBULATORY_CARE_PROVIDER_SITE_OTHER): Payer: No Typology Code available for payment source | Admitting: Nurse Practitioner

## 2020-03-02 ENCOUNTER — Other Ambulatory Visit: Payer: Self-pay | Admitting: Nurse Practitioner

## 2020-03-02 VITALS — BP 140/92 | HR 78 | Temp 97.9°F | Resp 18 | Ht 65.25 in | Wt 220.8 lb

## 2020-03-02 DIAGNOSIS — I10 Essential (primary) hypertension: Secondary | ICD-10-CM

## 2020-03-02 DIAGNOSIS — Z7689 Persons encountering health services in other specified circumstances: Secondary | ICD-10-CM

## 2020-03-02 DIAGNOSIS — E063 Autoimmune thyroiditis: Secondary | ICD-10-CM | POA: Diagnosis not present

## 2020-03-02 DIAGNOSIS — Z0000 Encounter for general adult medical examination without abnormal findings: Secondary | ICD-10-CM

## 2020-03-02 DIAGNOSIS — L439 Lichen planus, unspecified: Secondary | ICD-10-CM

## 2020-03-02 MED ORDER — LISINOPRIL-HYDROCHLOROTHIAZIDE 20-25 MG PO TABS: 1.0000 | ORAL_TABLET | Freq: Every day | ORAL | 6 refills | Status: DC

## 2020-03-02 NOTE — Patient Instructions (Signed)
Drink plenty of water  Get 20 minute daily of exercise at least 4 times a week  Follow a 2 gram or less sodium per day, low fat/cholesterol, carbohydrate/sugar diet, lower calorie intake  Get at least 6-8 hours of sleep per day/night  Blood pressure medication start taking, take in AM and keep blood pressure log 3 hours after taking. Blood pressure goal 140/90 or less. HR <100, seek medical attention for out of this range  Follow up every 6 months for labs and f/u apt.   No labs today as you completed in June per your report, please have record txd.

## 2020-03-02 NOTE — Progress Notes (Addendum)
New Patient Office Visit  Subjective:  Patient ID: Kaitlyn Kelly, female    DOB: [birth year]  Age: 59 y.o. MRN: [id number]  CC:  Chief Complaint  Patient presents with  . Establish Care    NP    HPI Kaitlyn Kelly is a 59 year old female presenting to the clinic to establish care with new provider/clinic. Her previous PCP was with [facility]. She will have records transferred. She has a h/o Raynauds, Hashimoto's, HTN, and  Lichen planus. The  Lichen planus has been managed by her GYN not well but has appt in 2 day with GYN specialist, reporting not had sexual intercourse with spouse in 2 months r/t discomfort.   No need for refills. She does report she had changed from lisinipril/hctz to metoprolol/hctz r//t h/o raynaud's and pcp thought it could help however has not and pt would like to change back. Will send 6 month refill. Time spent discussing Blood pressure goal 140/90 or less at 3 hours after taking controlling medications. She reports a bit higher out of this range since taking metoprolol but never before switching which is the other reason she would like to switch back.  COVID completed in 07/10/2019, 07/31/2019 Shingrex 1 completed felt very ill, did not complete #2 and made  Lichen planus worse PAP has GYN upt per pt (pending records) Mammo: 07/2019 Tdap utd per pt Cscope: age 35 y.o internal hem only.   No cp, ct, gu/gi sxs, pain, sob, edema, or falls, injury.   Past Medical History:  Diagnosis Date  . Anal fissure   . Arthritis   . Hemorrhoids   . Hypertension   . Thyroid disease     Past Surgical History:  Procedure Laterality Date  . ABDOMINAL HYSTERECTOMY Left 08/30/2002   normal  . COLONOSCOPY     had a 2014, 2017    Family History  Problem Relation Age of Onset  . Heart disease Mother   . Diabetes Sister   . Colon cancer Neg Hx   . Esophageal cancer Neg Hx   . Pancreatic cancer Neg Hx   . Prostate cancer Neg Hx   . Rectal cancer Neg Hx      Social History   Socioeconomic History  . Marital status: Married    Spouse name: Not on file  . Number of children: Not on file  . Years of education: Not on file  . Highest education level: Not on file  Occupational History  . Not on file  Tobacco Use  . Smoking status: Never Smoker  . Smokeless tobacco: Never Used  Vaping Use  . Vaping Use: Never used  Substance and Sexual Activity  . Alcohol use: Yes    Alcohol/week: 1.0 - 2.0 standard drink    Types: 1 - 2 Glasses of wine per week  . Drug use: No  . Sexual activity: Yes  Other Topics Concern  . Not on file  Social History Narrative  . Not on file   Social Determinants of Health   Financial Resource Strain:   . Difficulty of Paying Living Expenses:   Food Insecurity:   . Worried About Charity fundraiser in the Last Year:   . Arboriculturist in the Last Year:   Transportation Needs:   . Film/video editor (Medical):   Marland Kitchen Lack of Transportation (Non-Medical):   Physical Activity:   . Days of Exercise per Week:   . Minutes of Exercise per Session:  Stress:   . Feeling of Stress :   Social Connections:   . Frequency of Communication with Friends and Family:   . Frequency of Social Gatherings with Friends and Family:   . Attends Religious Services:   . Active Member of Clubs or Organizations:   . Attends Archivist Meetings:   Marland Kitchen Marital Status:   Intimate Partner Violence:   . Fear of Current or Ex-Partner:   . Emotionally Abused:   Marland Kitchen Physically Abused:   . Sexually Abused:     ROS Review of Systems  All other systems reviewed and are negative.   Objective:   Today's Vitals: BP (!) 140/92 (BP Location: Left Arm, Patient Position: Sitting, Cuff Size: Normal)   Pulse 78   Temp 97.9 F (36.6 C) (Temporal)   Resp 18   Ht 5' 5.25" (1.657 m)   Wt 220 lb 12.8 oz (100.2 kg)   SpO2 98%   BMI 36.46 kg/m   Physical Exam Vitals and nursing note reviewed.  Constitutional:       Appearance: Normal appearance. She is well-developed and well-groomed.  HENT:     Head: Normocephalic and atraumatic.     Right Ear: Hearing and external ear normal.     Left Ear: Hearing and external ear normal.     Nose: Nose normal. No congestion or rhinorrhea.     Mouth/Throat:     Mouth: Mucous membranes are moist.     Dentition: Normal dentition. No dental tenderness.     Tongue: No lesions. Tongue does not deviate from midline.     Pharynx: Oropharynx is clear. Uvula midline. No oropharyngeal exudate or posterior oropharyngeal erythema.  Eyes:     General: Lids are normal. Lids are everted, no foreign bodies appreciated.     Extraocular Movements: Extraocular movements intact.     Conjunctiva/sclera: Conjunctivae normal.     Pupils: Pupils are equal, round, and reactive to light.  Neck:     Thyroid: No thyroid tenderness.     Vascular: No carotid bruit or JVD.  Cardiovascular:     Rate and Rhythm: Normal rate and regular rhythm.     Pulses: Normal pulses.     Heart sounds: Normal heart sounds, S1 normal and S2 normal.  Pulmonary:     Effort: Pulmonary effort is normal.     Breath sounds: Normal breath sounds.  Abdominal:     General: There is no abdominal bruit.     Palpations: There is no hepatomegaly or splenomegaly.     Tenderness: There is no abdominal tenderness. There is no guarding or rebound.     Hernia: No hernia is present.  Musculoskeletal:        General: Normal range of motion.     Right shoulder: Normal.     Left shoulder: Normal.     Cervical back: Normal, full passive range of motion without pain, normal range of motion and neck supple.     Thoracic back: Normal.     Lumbar back: Normal.     Right lower leg: Normal. No edema.     Left lower leg: Normal. No edema.     Right ankle: Normal.     Right foot: Normal.     Left foot: Normal.  Lymphadenopathy:     Head:     Right side of head: No submental, submandibular or tonsillar adenopathy.     Left  side of head: No submental, submandibular or tonsillar adenopathy.  Cervical: No cervical adenopathy.     Upper Body:     Right upper body: No supraclavicular adenopathy.     Left upper body: No supraclavicular adenopathy.  Skin:    General: Skin is warm.     Capillary Refill: Capillary refill takes less than 2 seconds.     Coloration: Skin is not jaundiced or pale.  Neurological:     General: No focal deficit present.     Mental Status: She is alert and oriented to person, place, and time. Mental status is at baseline.     Gait: Gait normal.  Psychiatric:        Mood and Affect: Mood normal.        Behavior: Behavior is cooperative.        Thought Content: Thought content normal.        Judgment: Judgment normal.     Assessment & Plan:   Problem List Items Addressed This Visit      Cardiovascular and Mediastinum   Essential hypertension - Primary   Relevant Medications   lisinopril-hydrochlorothiazide (ZESTORETIC) 20-25 MG tablet   Other Relevant Orders   Lipid panel   COMPLETE METABOLIC PANEL WITH GFR   CBC with Differential/Platelet     Endocrine   HASHIMOTO'S THYROIDITIS   Relevant Medications   levothyroxine (SYNTHROID) 100 MCG tablet   Other Relevant Orders   Lipid panel   COMPLETE METABOLIC PANEL WITH GFR   CBC with Differential/Platelet     Musculoskeletal and Integument   Lichen planus    Other Visit Diagnoses    Encounter to establish care       Relevant Orders   Lipid panel   COMPLETE METABOLIC PANEL WITH GFR   CBC with Differential/Platelet   Adult general medical exam       Relevant Orders   Lipid panel   COMPLETE METABOLIC PANEL WITH GFR   CBC with Differential/Platelet    Drink plenty of water  Get 20 minute daily of exercise at least 4 times a week  Follow a 2 gram or less sodium per day, low fat/cholesterol, carbohydrate/sugar diet, lower calorie intake  Get at least 6-8 hours of sleep per day/night  Blood pressure medication  start taking, take in AM and keep blood pressure log 3 hours after taking. Blood pressure goal 140/90 or less. HR <100, seek medical attention for out of this range  Follow up every 6 months for labs and f/u apt.   No labs today as you completed in June per your report, please have record txd.  Outpatient Encounter Medications as of 03/02/2020  Medication Sig  . docusate sodium (COLACE) 100 MG capsule Take 100 mg by mouth 2 (two) times daily as needed for mild constipation.  . ergocalciferol (VITAMIN D2) 1.25 MG (50000 UT) capsule Take 50,000 Units by mouth once a week.  . fluticasone-salmeterol (ADVAIR HFA) 45-21 MCG/ACT inhaler Inhale 2 puffs into the lungs 2 (two) times daily.  . halobetasol (ULTRAVATE) 0.05 % cream Apply topically 2 (two) times daily.  Marland Kitchen levothyroxine (SYNTHROID) 100 MCG tablet Take 100 mcg by mouth daily before breakfast.  . lisinopril-hydrochlorothiazide (ZESTORETIC) 20-25 MG tablet Take 1 tablet by mouth daily.  . [DISCONTINUED] lisinopril-hydrochlorothiazide (ZESTORETIC) 20-25 MG tablet Take 1 tablet by mouth daily.  . [DISCONTINUED] metoprolol tartrate (LOPRESSOR) 50 MG tablet Take 50 mg by mouth 2 (two) times daily.  . [DISCONTINUED] betamethasone dipropionate (DIPROLENE) 0.05 % ointment Apply topically 2 (two) times daily. (Patient not taking: Reported on  03/02/2020)  . [DISCONTINUED] hydrochlorothiazide (HYDRODIURIL) 25 MG tablet Take 25 mg by mouth daily.  . [DISCONTINUED] levothyroxine (SYNTHROID, LEVOTHROID) 88 MCG tablet Take 1 tablet by mouth daily.  . [DISCONTINUED] lisinopril-hydrochlorothiazide (PRINZIDE,ZESTORETIC) 10-12.5 MG per tablet Take 1 tablet by mouth daily. Pt is taking Lisinopril/HCTZ 10/25 daily  . [DISCONTINUED] meloxicam (MOBIC) 7.5 MG tablet Take 1 tablet (7.5 mg total) by mouth 2 (two) times daily as needed for pain.   No facility-administered encounter medications on file as of 03/02/2020.    Follow-up: Return in about 3 months (around  06/16/2020) for lab and f/u.   Annie Main, FNP

## 2020-03-04 ENCOUNTER — Other Ambulatory Visit (HOSPITAL_COMMUNITY)
Admission: RE | Admit: 2020-03-04 | Discharge: 2020-03-04 | Disposition: A | Discharge status: Home / Self Care | Payer: No Typology Code available for payment source | Source: Ambulatory Visit | Attending: Obstetrics and Gynecology | Admitting: Obstetrics and Gynecology

## 2020-03-04 ENCOUNTER — Encounter: Payer: Self-pay | Admitting: Nurse Practitioner

## 2020-03-04 ENCOUNTER — Other Ambulatory Visit: Payer: Self-pay

## 2020-03-04 ENCOUNTER — Encounter: Payer: Self-pay | Admitting: General Practice

## 2020-03-04 ENCOUNTER — Encounter: Payer: Self-pay | Admitting: Obstetrics and Gynecology

## 2020-03-04 ENCOUNTER — Ambulatory Visit (INDEPENDENT_AMBULATORY_CARE_PROVIDER_SITE_OTHER): Payer: No Typology Code available for payment source | Admitting: Obstetrics and Gynecology

## 2020-03-04 VITALS — BP 122/88 | HR 77 | Ht 65.0 in | Wt 221.0 lb

## 2020-03-04 DIAGNOSIS — N9089 Other specified noninflammatory disorders of vulva and perineum: Secondary | ICD-10-CM | POA: Diagnosis not present

## 2020-03-04 DIAGNOSIS — L900 Lichen sclerosus et atrophicus: Secondary | ICD-10-CM | POA: Insufficient documentation

## 2020-03-04 NOTE — Progress Notes (Signed)
Obstetrics and Gynecology New Patient Evaluation  Appointment Date: 03/04/2020  OBGYN Clinic: [facility]  Primary Care Provider: Ishmael Holter A  Referring Provider: Kelton Pillar, MD  Chief Complaint:  Chief Complaint  Patient presents with   Gynecologic Exam    lichn sclerosus     History of Present Illness: Kaitlyn Kelly is a 59 y.o. Caucasian G1P1 (No LMP recorded. Patient has had a hysterectomy.), seen for the above chief complaint.   Patient states that approximately two years ago she started having vaginal itching and irritation and was diagnosed clinically by her PCP with LS and put on betamethasone for two weeks and then PRN. She states that two months ago she got the shingles shot and then had a flare in her s/s that was unresponsive to halobetasol that her PCP prescribed so she stopped. She states that it feels as if it is better on it's own but she still has s/s.   H/o pre-exisiting anal fissure and feels worse. +dyspareunia.    Review of Systems: Pertinent items noted in HPI and remainder of comprehensive ROS otherwise negative.    Patient Active Problem List   Diagnosis Date Noted   Vulvar irritation 03/04/2020   Vulvar lesion 24/58/0998   Lichen planus 33/82/5053   Pain in both hands 01/17/2020   UNSPECIFIED VITAMIN D DEFICIENCY 05/20/2009   ALLERGIC RHINITIS 05/20/2009   ELEVATED BLOOD PRESSURE WITHOUT DIAGNOSIS OF HYPERTENSION 05/20/2009   HASHIMOTO'S THYROIDITIS 12/07/2007   OBESITY, UNSPECIFIED 06/11/2007   Essential hypertension 06/11/2007   ASTHMA 06/11/2007   HEMATURIA, HX OF 06/11/2007     Past Medical History:  Past Medical History:  Diagnosis Date   Anal fissure    Anal fissure    Arthritis    Hemorrhoids    Hypertension    Thyroid disease     Past Surgical History:  Past Surgical History:  Procedure Laterality Date   COLONOSCOPY     had a 2014, 2017   TOTAL  ABDOMINAL HYSTERECTOMY Left 08/30/2002   TAH/RSO/LO cystectomy for bleeding, bilateral cysts    Past Obstetrical History:  OB History  Gravida Para Term Preterm AB Living  1 1       1   SAB TAB Ectopic Multiple Live Births          1    # Outcome Date GA Lbr Len/2nd Weight Sex Delivery Anes PTL Lv  1 Para             Obstetric Comments  SVD x 1    Past Gynecological History: As per HPI. History of Pap Smear(s): No.  History of HRT use: No.   Social History:  Social History   Socioeconomic History   Marital status: Married    Spouse name: Not on file   Number of children: Not on file   Years of education: Not on file   Highest education level: Not on file  Occupational History   Not on file  Tobacco Use   Smoking status: Never Smoker   Smokeless tobacco: Never Used  Vaping Use   Vaping Use: Never used  Substance and Sexual Activity   Alcohol use: Yes    Alcohol/week: 1.0 - 2.0 standard drink    Types: 1 - 2 Glasses of wine per week   Drug use: No   Sexual activity: Yes  Other Topics Concern   Not on file  Social History Narrative   Not on file   Social Determinants of  Health   Financial Resource Strain:    Difficulty of Paying Living Expenses: Not on file  Food Insecurity: No Food Insecurity   Worried About Running Out of Food in the Last Year: Never true   Ran Out of Food in the Last Year: Never true  Transportation Needs: No Transportation Needs   Lack of Transportation (Medical): No   Lack of Transportation (Non-Medical): No  Physical Activity:    Days of Exercise per Week: Not on file   Minutes of Exercise per Session: Not on file  Stress:    Feeling of Stress : Not on file  Social Connections:    Frequency of Communication with Friends and Family: Not on file   Frequency of Social Gatherings with Friends and Family: Not on file   Attends Religious Services: Not on file   Active Member of Clubs or Organizations: Not on  file   Attends Archivist Meetings: Not on file   Marital Status: Not on file  Intimate Partner Violence:    Fear of Current or Ex-Partner: Not on file   Emotionally Abused: Not on file   Physically Abused: Not on file   Sexually Abused: Not on file    Family History:  Family History  Problem Relation Age of Onset   Heart disease Mother    Diabetes Sister    Colon cancer Neg Hx    Esophageal cancer Neg Hx    Pancreatic cancer Neg Hx    Prostate cancer Neg Hx    Rectal cancer Neg Hx     Medications Jalexus G. Baskin had no medications administered during this visit. Current Outpatient Medications  Medication Sig Dispense Refill   docusate sodium (COLACE) 100 MG capsule Take 100 mg by mouth 2 (two) times daily as needed for mild constipation.     ergocalciferol (VITAMIN D2) 1.25 MG (50000 UT) capsule Take 50,000 Units by mouth once a week.     fluticasone-salmeterol (ADVAIR HFA) 45-21 MCG/ACT inhaler Inhale 2 puffs into the lungs 2 (two) times daily.     levothyroxine (SYNTHROID) 100 MCG tablet Take 100 mcg by mouth daily before breakfast.     lisinopril-hydrochlorothiazide (ZESTORETIC) 20-25 MG tablet Take 1 tablet by mouth daily. 30 tablet 6   halobetasol (ULTRAVATE) 0.05 % cream Apply topically 2 (two) times daily. (Patient not taking: Reported on 03/04/2020)     No current facility-administered medications for this visit.    Allergies Sulfonamide derivatives   Physical Exam:  BP 122/88    Pulse 77    Ht 5\' 5"  (1.651 m)    Wt 221 lb (100.2 kg)    BMI 36.78 kg/m  Body mass index is 36.78 kg/m.  General appearance: Well nourished, well developed female in no acute distress.  Cardiovascular: normal s1 and s2.  No murmurs, rubs or gallops. Respiratory:  Clear to auscultation bilateral. Normal respiratory effort Abdomen: positive bowel sounds and no masses, hernias; diffusely non tender to palpation, non distended Neuro/Psych:  Normal mood and  affect.  Skin:  Warm and dry.  Lymphatic:  No inguinal lymphadenopathy.   Pelvic exam: is not limited by body habitus EGBUS: External exam done: from the clitoral hood down approximately 3/4 of the way to the posterior fourchette, there is some slight white discoloration of the skin bilaterally. Below this, the discoloration worsens and increases to irritation with pink, red and then purple plaques there are noted a 2-3 cm above the rectum on both sides at 10 and and  2 o'clock in relation to the rectum; these areas are the most tender areas. The right plaque was cleaned with betadine and alcohol and injected with 75mL of lidocaine with epi. Next, a 66mm punch biopsy was done at the plaque and try to get a slight edge of normal skin adjacent to it. The area was then treated with silver nitrate sticks for excellent hemostasis.  Vagina: within normal limits and with no blood or discharge in the vault. Normal cuff Adnexa:  normal adnexa and no mass, fullness, tenderness Rectovaginal: deferred  Laboratory: none  Radiology: none  Assessment: pt stable  Plan: pt stable 1. Vulvar irritation Recommend follow up on biopsy before moving forward with treatment.  - Surgical pathology( [place]/ [facility])  2. Vulvar lesion RTC after biopsy results are back  Durene Romans MD Attending [facility])

## 2020-03-04 NOTE — Procedures (Signed)
Vulvar Biopsy Procedure Note  Pre-operative Diagnosis: History vulvar irritation unresponsive to steroids  Post-operative Diagnosis: same  Procedure Details  The risks (including infection, bleeding, pain, and uterine perforation) and benefits of the procedure were explained to the patient and Written informed consent was obtained.  The patient was placed in the dorsal lithotomy position. Pederson speculum placed and normal vaginal vault and cuff noted, with no bleeding or abnormal discharge with just mild atrophy noted.   External exam done: from the clitoral hood down approximately 3/4 of the way to the posterior fourchette, there is some slight white discoloration of the skin bilaterally. Below this, the discoloration worsens and increases to irritation with pink, red and then purple plaques there are noted a 2-3 cm above the rectum on both sides at 10 and and 2 o'clock in relation to the rectum; these areas are the most tender areas.  The right plaque was cleaned with betadine and alcohol and injected with 36mL of lidocaine with epi. Next, a 83mm punch biopsy was done at the plaque and try to get a slight edge of normal skin adjacent to it. The area was then treated with silver nitrate sticks for excellent hemostasis.   The sample was sent for pathologic examination.  Condition: Stable  Complications: None  Plan: The patient was advised to call for any fever or for prolonged or severe pain or bleeding. She was advised to cover the area with copious amounts of bactricain ointment to help prevent infection and as a barrier type cream.  Durene Romans MD Attending [facility])

## 2020-03-08 ENCOUNTER — Encounter: Payer: Self-pay | Admitting: Obstetrics and Gynecology

## 2020-03-09 LAB — SURGICAL PATHOLOGY

## 2020-03-11 ENCOUNTER — Other Ambulatory Visit: Payer: Self-pay | Admitting: Obstetrics and Gynecology

## 2020-03-11 ENCOUNTER — Encounter: Payer: Self-pay | Admitting: Obstetrics and Gynecology

## 2020-03-11 MED ORDER — CLOBETASOL PROPIONATE 0.05 % EX OINT: TOPICAL_OINTMENT | CUTANEOUS | 2 refills | Status: DC

## 2020-03-11 NOTE — Addendum Note (Signed)
Addended by: Aletha Halim on: 03/11/2020 12:31 PM   Modules accepted: Orders

## 2020-04-20 ENCOUNTER — Encounter: Payer: Self-pay | Admitting: Family Medicine

## 2020-04-20 ENCOUNTER — Other Ambulatory Visit: Payer: Self-pay | Admitting: Family Medicine

## 2020-04-20 ENCOUNTER — Other Ambulatory Visit (HOSPITAL_COMMUNITY): Payer: Self-pay | Admitting: Family Medicine

## 2020-06-10 ENCOUNTER — Ambulatory Visit (INDEPENDENT_AMBULATORY_CARE_PROVIDER_SITE_OTHER): Payer: No Typology Code available for payment source | Admitting: Obstetrics and Gynecology

## 2020-06-10 ENCOUNTER — Other Ambulatory Visit: Payer: Self-pay

## 2020-06-10 ENCOUNTER — Other Ambulatory Visit: Payer: Self-pay | Admitting: Obstetrics and Gynecology

## 2020-06-10 VITALS — BP 129/81 | HR 80 | Wt 218.1 lb

## 2020-06-10 DIAGNOSIS — L900 Lichen sclerosus et atrophicus: Secondary | ICD-10-CM

## 2020-06-10 MED ORDER — CLOBETASOL PROPIONATE 0.05 % EX OINT
TOPICAL_OINTMENT | CUTANEOUS | 2 refills | Status: DC
Start: 2020-06-10 — End: 2020-06-10

## 2020-06-10 NOTE — Progress Notes (Signed)
Obstetrics and Gynecology Visit Return Patient Evaluation  Appointment Date: 06/10/2020  Primary Care Provider: Bates, Howards Kelly for Kaitlyn Kelly  Chief Complaint: follow up LS  History of Present Illness:  Kaitlyn Kelly is a 59 y.o. here for 3 month follow up. 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 She was started on clobetasol 0.05% qhs x 74m, qod x 31m and then 2x/week.   Interval History: Since that time, she states that her s/s are very much improved and she started feeling better after only a few days. She is on the 2x/week regimen. Her dyspareunia is also much improved with just slight s/s that are at the introitus, helped with lubrication.   Review of Systems: as noted in the History of Present Illness.  Patient Active Problem List   Diagnosis Date Noted  . Vulvar irritation 03/04/2020  . Lichen sclerosus 76/16/0737  . Pain in both hands 01/17/2020  . UNSPECIFIED VITAMIN D DEFICIENCY 05/20/2009  . ALLERGIC RHINITIS 05/20/2009  . ELEVATED BLOOD PRESSURE WITHOUT DIAGNOSIS OF HYPERTENSION 05/20/2009  . HASHIMOTO'S THYROIDITIS 12/07/2007  . OBESITY, UNSPECIFIED 06/11/2007  . Essential hypertension 06/11/2007  . ASTHMA 06/11/2007  . HEMATURIA, HX OF 06/11/2007   Medications:  Kaitlyn Kelly. Conde had no medications administered during this visit. Current Outpatient Medications  Medication Sig Dispense Refill  . clobetasol ointment (TEMOVATE) 0.05 % Apply generously to vulva (both sides), perineum, affected areas. Month 1: qhs. Month 2: every other night. Month 3: 2x/week. 60 g 2  . docusate sodium (COLACE) 100 MG capsule Take 100 mg by mouth 2 (two) times daily as needed for mild constipation.    . ergocalciferol (VITAMIN D2) 1.25 MG (50000 UT) capsule Take 50,000 Units by mouth once a week.    . fluticasone-salmeterol (ADVAIR HFA) 45-21 MCG/ACT inhaler Inhale 2  puffs into the lungs 2 (two) times daily.    Marland Kitchen levothyroxine (SYNTHROID) 100 MCG tablet TAKE 1 TABLET BY MOUTH ON AN EMPTY STOMACH IN THE MORNING ONCE A DAY 90 tablet 3  . lisinopril-hydrochlorothiazide (ZESTORETIC) 20-25 MG tablet Take 1 tablet by mouth daily. 30 tablet 6   No current facility-administered medications for this visit.    Allergies: is allergic to sulfonamide derivatives.  Physical Exam:  BP 129/81   Pulse 80   Wt 218 lb 1.6 oz (98.9 kg)   BMI 36.29 kg/m  Body mass index is 36.29 kg/m. General appearance: Well nourished, well developed female in no acute distress.  Neuro/Psych:  Normal mood and affect.    Pelvic exam:  EGBUS: the external area is almost completely back to normal. There is normal mild to moderate atrophy but the there is only slight b/l white hue to the labia and near the rectal area. Non ttp   Assessment: pt doing great  Plan: Area is so much better. I recommend doing every other day until jan 1 and then 2x/week to see if it can clear up completely.   RTC: late jan/early feb repeat exam and consideration for vaginal estrogen.   Durene Romans MD Attending Kelly for Kaitlyn Kelly [employer])

## 2020-06-15 ENCOUNTER — Other Ambulatory Visit: Payer: Self-pay

## 2020-06-15 ENCOUNTER — Other Ambulatory Visit: Payer: No Typology Code available for payment source

## 2020-06-15 DIAGNOSIS — E063 Autoimmune thyroiditis: Secondary | ICD-10-CM

## 2020-06-15 LAB — COMPLETE METABOLIC PANEL WITH GFR
AG Ratio: 1.6 (calc) (ref 1.0–2.5)
ALT: 18 U/L (ref 6–29)
AST: 18 U/L (ref 10–35)
Albumin: 4.2 g/dL (ref 3.6–5.1)
Alkaline phosphatase (APISO): 57 U/L (ref 37–153)
BUN: 14 mg/dL (ref 7–25)
CO2: 30 mmol/L (ref 20–32)
Calcium: 9.7 mg/dL (ref 8.6–10.4)
Chloride: 105 mmol/L (ref 98–110)
Creat: 1.04 mg/dL (ref 0.50–1.05)
GFR, Est African American: 69 mL/min/{1.73_m2} (ref >=60)
GFR, Est Non African American: 60 mL/min/{1.73_m2} (ref >=60)
Globulin: 2.6 g/dL (calc) (ref 1.9–3.7)
Glucose, Bld: 94 mg/dL (ref 65–99)
Potassium: 4 mmol/L (ref 3.5–5.3)
Sodium: 143 mmol/L (ref 135–146)
Total Bilirubin: 0.4 mg/dL (ref 0.2–1.2)
Total Protein: 6.8 g/dL (ref 6.1–8.1)

## 2020-06-15 LAB — CBC WITH DIFFERENTIAL/PLATELET
Absolute Monocytes: 454 cells/uL (ref 200–950)
Basophils Absolute: 43 cells/uL (ref 0–200)
Basophils Relative: 0.6 %
Eosinophils Absolute: 163 cells/uL (ref 15–500)
Eosinophils Relative: 2.3 %
HCT: 44 % (ref 35.0–45.0)
Hemoglobin: 14.8 g/dL (ref 11.7–15.5)
Lymphs Abs: 2293 cells/uL (ref 850–3900)
MCH: 28.4 pg (ref 27.0–33.0)
MCHC: 33.6 g/dL (ref 32.0–36.0)
MCV: 84.5 fL (ref 80.0–100.0)
MPV: 11.1 fL (ref 7.5–12.5)
Monocytes Relative: 6.4 %
Neutro Abs: 4146 cells/uL (ref 1500–7800)
Neutrophils Relative %: 58.4 %
Platelets: 271 10*3/uL (ref 140–400)
RBC: 5.21 10*6/uL — ABNORMAL HIGH (ref 3.80–5.10)
RDW: 16.2 % — ABNORMAL HIGH (ref 11.0–15.0)
Total Lymphocyte: 32.3 %
WBC: 7.1 10*3/uL (ref 3.8–10.8)

## 2020-06-15 LAB — LIPID PANEL
Cholesterol: 184 mg/dL (ref <200)
HDL: 55 mg/dL (ref >=50)
LDL Cholesterol (Calc): 102 mg/dL (calc) — ABNORMAL HIGH
Non-HDL Cholesterol (Calc): 129 mg/dL (calc) (ref <130)
Total CHOL/HDL Ratio: 3.3 (calc) (ref <5.0)
Triglycerides: 173 mg/dL — ABNORMAL HIGH (ref <150)

## 2020-06-16 LAB — TSH: TSH: 1.91 mIU/L (ref 0.40–4.50)

## 2020-06-19 ENCOUNTER — Other Ambulatory Visit: Payer: Self-pay

## 2020-06-19 ENCOUNTER — Other Ambulatory Visit: Payer: Self-pay | Admitting: Family Medicine

## 2020-06-19 ENCOUNTER — Encounter: Payer: Self-pay | Admitting: Family Medicine

## 2020-06-19 ENCOUNTER — Ambulatory Visit (INDEPENDENT_AMBULATORY_CARE_PROVIDER_SITE_OTHER): Payer: No Typology Code available for payment source | Admitting: Family Medicine

## 2020-06-19 VITALS — BP 110/66 | HR 82 | Temp 98.1°F | Resp 16 | Ht 65.0 in | Wt 218.0 lb

## 2020-06-19 DIAGNOSIS — E559 Vitamin D deficiency, unspecified: Secondary | ICD-10-CM | POA: Diagnosis not present

## 2020-06-19 DIAGNOSIS — J45909 Unspecified asthma, uncomplicated: Secondary | ICD-10-CM | POA: Diagnosis not present

## 2020-06-19 DIAGNOSIS — E063 Autoimmune thyroiditis: Secondary | ICD-10-CM

## 2020-06-19 DIAGNOSIS — I10 Essential (primary) hypertension: Secondary | ICD-10-CM

## 2020-06-19 MED ORDER — LISINOPRIL-HYDROCHLOROTHIAZIDE 20-25 MG PO TABS: 1.0000 | ORAL_TABLET | Freq: Every day | ORAL | 2 refills | Status: DC

## 2020-06-19 NOTE — Patient Instructions (Addendum)
F/U August for Physical

## 2020-06-19 NOTE — Progress Notes (Signed)
   Subjective:    Patient ID: Kaitlyn Kelly, female    DOB: 1961/04/09, 59 y.o.   MRN: [id number]  Patient presents for Follow-up (is not fasting)  Pt here to f/u chronic medical problems  previous patient of our nurse practitioner who has left meds and history reviewed in detail   Kaitlyn Kelly is a 59 year old female presenting to the clinic to establish care with new provider/clinic. Her previous PCP was with [facility]. She will have records transferred. She has a h/o Raynauds, Hashimoto's, HTN, and  Lichen planus. The  Lichen planus has been managed by her GYN - Dr. Ilda Basset- she had biopsy and now on Clobetasol every other day now for a month and will then taper to twice a day   Hashimoto- on levothyroxine 124mcg one a day, se has had thyroid biopses done in the past  her TSH is typically between 1-2   Taking lisinopril HCTZ  20-25mg  once a day  She history of Raynauds   History of Anal fissure- colonoscopy   taking colace once a day     Asthma- she is on advair and has ventolin, has flares most winteres uses tussionex/ ventolin   COVID completed in 07/10/2019, 07/31/2019 Shingrex 1 completed felt very ill, did not complete #2 and made  Lichen planus worse   She sin high dose  ,IU weekly for vitamin D def    Fasting labs revieewed- her TG were elevated at 173 - she had labs done after coming in from a resort vacation    Mammo: 07/2019 Tdap utd per pt Cscope: age 74 y.o internal hem only.    NCIR ( TDAP) UTD    Review Of Systems:  GEN- denies fatigue, fever, weight loss,weakness, recent illness HEENT- denies eye drainage, change in vision, nasal discharge, CVS- denies chest pain, palpitations RESP- denies SOB, cough, wheeze ABD- denies N/V, change in stools, abd pain GU- denies dysuria, hematuria, dribbling, incontinence MSK- denies joint pain, muscle aches, injury Neuro- denies headache, dizziness, syncope, seizure activity       Objective:    BP  110/66   Pulse 82   Temp 98.1 F (36.7 C) (Temporal)   Resp 16   Ht 5\' 5"  (1.651 m)   Wt 218 lb (98.9 kg)   SpO2 95%   BMI 36.28 kg/m  GEN- NAD, alert and oriented x3 HEENT- PERRL, EOMI, non injected sclera, pink conjunctiva, MMM, oropharynx clear Neck- Supple, no thyromegaly CVS- RRR, no murmur RESP-CTAB ABD-NABS,soft,NT,ND EXT- No edema Pulses- Radial, DP- 2+        Assessment & Plan:     Repeat fasting labs at CPE next year   Problem List Items Addressed This Visit      Unprioritized   Asthma    Controlled, continue current regimen      Essential hypertension - Primary    Controlled no changes       Relevant Medications   lisinopril-hydrochlorothiazide (ZESTORETIC) 20-25 MG tablet   HASHIMOTO'S THYROIDITIS    Continue thyroid replacement goal between 1-2       Vitamin D deficiency    Continue supplement          Note: This dictation was prepared with Dragon dictation along with smaller phrase technology. Any transcriptional errors that result from this process are unintentional.

## 2020-06-21 ENCOUNTER — Encounter: Payer: Self-pay | Admitting: Family Medicine

## 2020-06-21 NOTE — Assessment & Plan Note (Signed)
Controlled no changes 

## 2020-06-21 NOTE — Assessment & Plan Note (Signed)
Continue thyroid replacement goal between 1-2

## 2020-06-21 NOTE — Assessment & Plan Note (Signed)
Continue supplement. 

## 2020-06-21 NOTE — Assessment & Plan Note (Signed)
Controlled, continue current regimen

## 2020-06-22 ENCOUNTER — Encounter: Payer: Self-pay | Admitting: *Deleted

## 2020-09-01 ENCOUNTER — Encounter: Payer: Self-pay | Admitting: Family Medicine

## 2020-09-24 ENCOUNTER — Other Ambulatory Visit: Payer: Self-pay

## 2020-09-24 ENCOUNTER — Encounter: Payer: Self-pay | Admitting: Nurse Practitioner

## 2020-09-24 ENCOUNTER — Other Ambulatory Visit: Payer: Self-pay | Admitting: Nurse Practitioner

## 2020-09-24 ENCOUNTER — Ambulatory Visit (INDEPENDENT_AMBULATORY_CARE_PROVIDER_SITE_OTHER): Payer: No Typology Code available for payment source | Admitting: Nurse Practitioner

## 2020-09-24 VITALS — BP 122/80 | HR 84 | Temp 97.2°F | Ht 65.0 in | Wt 217.0 lb

## 2020-09-24 DIAGNOSIS — L247 Irritant contact dermatitis due to plants, except food: Secondary | ICD-10-CM | POA: Diagnosis not present

## 2020-09-24 MED ORDER — PREDNISONE 10 MG PO TABS: ORAL_TABLET | ORAL | 0 refills | Status: DC

## 2020-09-24 MED ORDER — METHYLPREDNISOLONE ACETATE 80 MG/ML IJ SUSP
60.0000 mg | Freq: Once | INTRAMUSCULAR | Status: AC
  Administered 2020-09-24: 60 mg via INTRAMUSCULAR

## 2020-09-24 NOTE — Patient Instructions (Signed)
Poison Ivy Dermatitis Poison ivy dermatitis is inflammation of the skin that is caused by chemicals in the leaves of the poison ivy plant. The skin reaction often involves redness, swelling, blisters, and extreme itching. What are the causes? This condition is caused by a chemical (urushiol) found in the sap of the poison ivy plant. This chemical is sticky and can be easily spread to people, animals, and objects. You can get poison ivy dermatitis by:  Having direct contact with a poison ivy plant.  Touching animals, other people, or objects that have come in contact with poison ivy and have the chemical on them. What increases the risk? This condition is more likely to develop in people who:  Are outdoors often in wooded or [place] areas.  Go outdoors without wearing protective clothing, such as closed shoes, long pants, and a long-sleeved shirt. What are the signs or symptoms? Symptoms of this condition include:  Redness of the skin.  Extreme itching.  A rash that often includes bumps and blisters. The rash usually appears 48 hours after exposure, if you have been exposed before. If this is the first time you have been exposed, the rash may not appear until a week after exposure.  Swelling. This may occur if the reaction is more severe. Symptoms usually last for 1-2 weeks. However, the first time you develop this condition, symptoms may last 3-4 weeks.   How is this diagnosed? This condition may be diagnosed based on your symptoms and a physical exam. Your health care provider may also ask you about any recent outdoor activity. How is this treated? Treatment for this condition will vary depending on how severe it is. Treatment may include:  Hydrocortisone cream or calamine lotion to relieve itching.  Oatmeal baths to soothe the skin.  Medicines, such as over-the-counter antihistamine tablets.  Oral steroid medicine, for more severe reactions. Follow these instructions at  home: Medicines  Take or apply over-the-counter and prescription medicines only as told by your health care provider.  Use hydrocortisone cream or calamine lotion as needed to soothe the skin and relieve itching. General instructions  Do not scratch or rub your skin.  Apply a cold, wet cloth (cold compress) to the affected areas or take baths in cool water. This will help with itching. Avoid hot baths and showers.  Take oatmeal baths as needed. Use colloidal oatmeal. You can get this at your local pharmacy or grocery store. Follow the instructions on the packaging.  While you have the rash, wash clothes right after you wear them.  Keep all follow-up visits as told by your health care provider. This is important. How is this prevented?  Learn to identify the poison ivy plant and avoid contact with the plant. This plant can be recognized by the number of leaves. Generally, poison ivy has three leaves with flowering branches on a single stem. The leaves are typically glossy, and they have jagged edges that come to a point at the front.  If you have been exposed to poison ivy, thoroughly wash with soap and water right away. You have about 30 minutes to remove the plant resin before it will cause the rash. Be sure to wash under your fingernails, because any plant resin there will continue to spread the rash.  When hiking or camping, wear clothes that will help you to avoid exposure on the skin. This includes long pants, a long-sleeved shirt, tall socks, and hiking boots. You can also apply preventive lotion to your skin  to help limit exposure.  If you suspect that your clothes or outdoor gear came in contact with poison ivy, rinse them off outside with a garden hose before you bring them inside your house.  When doing yard work or gardening, wear gloves, long sleeves, long pants, and boots. Wash your garden tools and gloves if they come in contact with poison ivy.  If you suspect that your  pet has come into contact with poison ivy, wash him or her with pet shampoo and water. Make sure to wear gloves while washing your pet.   Contact a health care provider if you have:  Open sores in the rash area.  More redness, swelling, or pain in the affected area.  Redness that spreads beyond the rash area.  Fluid, blood, or pus coming from the affected area.  A fever.  A rash over a large area of your body.  A rash on your eyes, mouth, or genitals.  A rash that does not improve after a few weeks. Get help right away if:  Your face swells or your eyes swell shut.  You have trouble breathing.  You have trouble swallowing. These symptoms may represent a serious problem that is an emergency. Do not wait to see if the symptoms will go away. Get medical help right away. Call your local emergency services (911 in the [place] Do not drive yourself to the hospital. Summary  Poison ivy dermatitis is inflammation of the skin that is caused by chemicals in the leaves of the poison ivy plant.  Symptoms of this condition include redness, itching, a rash, and swelling.  Do not scratch or rub your skin.  Take or apply over-the-counter and prescription medicines only as told by your health care provider. This information is not intended to replace advice given to you by your health care provider. Make sure you discuss any questions you have with your health care provider. Document Revised: 10/26/2018 Document Reviewed: 06/29/2018 Elsevier Patient Education  2021 [employer].

## 2020-09-24 NOTE — Progress Notes (Signed)
Subjective:    Patient ID: Kaitlyn Kelly, female    DOB: 11-11-1960, 60 y.o.   MRN: [id number]  HPI: Kaitlyn Kelly is a 60 y.o. female presenting for  Chief Complaint  Patient presents with  . Poison Ivy    Exposed last wk   RASH Duration:  days  Location: generalized, trunk, back, legs, arms  Itching: yes Burning: no Redness: yes Oozing: no Scaling: no Blisters: yes Painful: no Fevers: no Change in detergents/soaps/personal care products: no Recent illness: no Recent travel:no History of same: yes Context: spreading Alleviating factors: nothing Treatments attempted: clobetasol, calamine, benadryl Shortness of breath: no  Throat/tongue swelling: no Myalgias/arthralgias: no   Allergies  Allergen Reactions  . Shingrix [Zoster Vac Recomb Adjuvanted]     LICHEN'S FLARE  . Sulfonamide Derivatives     rash    Outpatient Encounter Medications as of 09/24/2020  Medication Sig  . clobetasol ointment (TEMOVATE) 0.05 % Apply generously to vulva (both sides), perineum, affected areas. Until 07/18/20: every other night. After 07/19/20: 2x/week.  . docusate sodium (COLACE) 100 MG capsule Take 100 mg by mouth 2 (two) times daily as needed for mild constipation.  . fluticasone-salmeterol (ADVAIR HFA) 45-21 MCG/ACT inhaler Inhale 2 puffs into the lungs 2 (two) times daily.  Marland Kitchen levothyroxine (SYNTHROID) 100 MCG tablet TAKE 1 TABLET BY MOUTH ON AN EMPTY STOMACH IN THE MORNING ONCE A DAY  . lisinopril-hydrochlorothiazide (ZESTORETIC) 20-25 MG tablet Take 1 tablet by mouth daily.  . predniSONE (DELTASONE) 10 MG tablet Take 6 tablets (60 mg total) by mouth daily with breakfast for 1 day, THEN 5 tablets (50 mg total) daily with breakfast for 1 day, THEN 4 tablets (40 mg total) daily with breakfast for 1 day, THEN 3 tablets (30 mg total) daily with breakfast for 1 day, THEN 2 tablets (20 mg total) daily with breakfast for 1 day, THEN 1 tablet (10 mg total) daily with breakfast for 1  day.  . [DISCONTINUED] ergocalciferol (VITAMIN D2) 1.25 MG (50000 UT) capsule Take 50,000 Units by mouth once a week.   Facility-Administered Encounter Medications as of 09/24/2020  Medication  . methylPREDNISolone acetate (DEPO-MEDROL) injection 60 mg    Patient Active Problem List   Diagnosis Date Noted  . Vulvar irritation 03/04/2020  . Lichen sclerosus 35/59/7416  . Pain in both hands 01/17/2020  . Vitamin D deficiency 05/20/2009  . ALLERGIC RHINITIS 05/20/2009  . ELEVATED BLOOD PRESSURE WITHOUT DIAGNOSIS OF HYPERTENSION 05/20/2009  . HASHIMOTO'S THYROIDITIS 12/07/2007  . OBESITY, UNSPECIFIED 06/11/2007  . Essential hypertension 06/11/2007  . Asthma 06/11/2007  . HEMATURIA, HX OF 06/11/2007    Past Medical History:  Diagnosis Date  . Anal fissure   . Anal fissure   . Arthritis   . Hemorrhoids   . Hypertension   . Thyroid disease     Relevant past medical, surgical, family and social history reviewed and updated as indicated. Interim medical history since our last visit reviewed.  Review of Systems Per HPI unless specifically indicated above     Objective:    BP 122/80   Pulse 84   Temp (!) 97.2 F (36.2 C)   Ht 5\' 5"  (1.651 m)   Wt 217 lb (98.4 kg)   SpO2 99%   BMI 36.11 kg/m   Wt Readings from Last 3 Encounters:  09/24/20 217 lb (98.4 kg)  06/19/20 218 lb (98.9 kg)  06/10/20 218 lb 1.6 oz (98.9 kg)    Physical Exam Vitals and  nursing note reviewed.  Constitutional:      General: She is not in acute distress.    Appearance: Normal appearance. She is not toxic-appearing.  HENT:     Head: Normocephalic and atraumatic.     Mouth/Throat:     Mouth: Mucous membranes are moist.     Pharynx: Oropharynx is clear.  Eyes:     General: No scleral icterus.    Extraocular Movements: Extraocular movements intact.  Skin:    General: Skin is warm and dry.     Capillary Refill: Capillary refill takes less than 2 seconds.     Findings: Rash present. Rash is  papular and urticarial.     Comments: Erythematous, papular rash noted to multiple areas on body including forearms, trunk, and neck.  Some areas with small intact blisters.    Neurological:     Mental Status: She is alert and oriented to person, place, and time.  Psychiatric:        Mood and Affect: Mood normal.        Behavior: Behavior normal.        Thought Content: Thought content normal.        Judgment: Judgment normal.        Assessment & Plan:  1. Contact dermatitis and eczema due to plant Acute.  No red flags on examination today.  Will treat with IM corticosteroid today and start oral 7-day taper tomorrow.  Follow up if symptoms not improving near end of taper, may need to extend steroid.  Okay to continue Benadryl qhs to help prevent itch while sleeping.  - methylPREDNISolone acetate (DEPO-MEDROL) injection 60 mg - predniSONE (DELTASONE) 10 MG tablet; Take 6 tablets (60 mg total) by mouth daily with breakfast for 1 day, THEN 5 tablets (50 mg total) daily with breakfast for 1 day, THEN 4 tablets (40 mg total) daily with breakfast for 1 day, THEN 3 tablets (30 mg total) daily with breakfast for 1 day, THEN 2 tablets (20 mg total) daily with breakfast for 1 day, THEN 1 tablet (10 mg total) daily with breakfast for 1 day.  Dispense: 21 tablet; Refill: 0    Follow up plan: Return if symptoms worsen or fail to improve.

## 2020-09-29 ENCOUNTER — Encounter: Payer: Self-pay | Admitting: Nurse Practitioner

## 2020-09-29 DIAGNOSIS — L247 Irritant contact dermatitis due to plants, except food: Secondary | ICD-10-CM

## 2020-09-30 ENCOUNTER — Other Ambulatory Visit: Payer: Self-pay | Admitting: Nurse Practitioner

## 2020-09-30 MED ORDER — PREDNISONE 10 MG PO TABS: ORAL_TABLET | ORAL | 0 refills | Status: DC

## 2020-10-20 ENCOUNTER — Other Ambulatory Visit (HOSPITAL_COMMUNITY): Payer: Self-pay

## 2020-10-20 MED FILL — Lisinopril & Hydrochlorothiazide Tab 20-25 MG: ORAL | 90 days supply | Qty: 90 | Fill #0 | Status: AC

## 2020-10-20 MED FILL — Levothyroxine Sodium Tab 100 MCG: ORAL | 90 days supply | Qty: 90 | Fill #0 | Status: AC

## 2020-10-23 LAB — HM MAMMOGRAPHY

## 2020-12-07 ENCOUNTER — Other Ambulatory Visit (HOSPITAL_COMMUNITY): Payer: Self-pay

## 2020-12-07 ENCOUNTER — Other Ambulatory Visit: Payer: Self-pay

## 2020-12-07 MED FILL — Clobetasol Propionate Oint 0.05%: CUTANEOUS | 30 days supply | Qty: 60 | Fill #0 | Status: AC

## 2021-01-13 ENCOUNTER — Other Ambulatory Visit: Payer: Self-pay

## 2021-01-13 ENCOUNTER — Encounter: Payer: Self-pay | Admitting: Nurse Practitioner

## 2021-01-13 ENCOUNTER — Emergency Department (HOSPITAL_BASED_OUTPATIENT_CLINIC_OR_DEPARTMENT_OTHER)
Admission: EM | Admit: 2021-01-13 | Discharge: 2021-01-13 | Disposition: A | Discharge status: Home / Self Care | Payer: No Typology Code available for payment source | Attending: Emergency Medicine | Admitting: Emergency Medicine

## 2021-01-13 ENCOUNTER — Emergency Department (HOSPITAL_BASED_OUTPATIENT_CLINIC_OR_DEPARTMENT_OTHER): Payer: No Typology Code available for payment source | Admitting: Radiology

## 2021-01-13 ENCOUNTER — Encounter (HOSPITAL_BASED_OUTPATIENT_CLINIC_OR_DEPARTMENT_OTHER): Payer: Self-pay | Admitting: Emergency Medicine

## 2021-01-13 DIAGNOSIS — J45909 Unspecified asthma, uncomplicated: Secondary | ICD-10-CM | POA: Diagnosis not present

## 2021-01-13 DIAGNOSIS — Z7951 Long term (current) use of inhaled steroids: Secondary | ICD-10-CM | POA: Insufficient documentation

## 2021-01-13 DIAGNOSIS — Z79899 Other long term (current) drug therapy: Secondary | ICD-10-CM | POA: Diagnosis not present

## 2021-01-13 DIAGNOSIS — X501XXA Overexertion from prolonged static or awkward postures, initial encounter: Secondary | ICD-10-CM | POA: Insufficient documentation

## 2021-01-13 DIAGNOSIS — S86911A Strain of unspecified muscle(s) and tendon(s) at lower leg level, right leg, initial encounter: Secondary | ICD-10-CM | POA: Insufficient documentation

## 2021-01-13 DIAGNOSIS — I10 Essential (primary) hypertension: Secondary | ICD-10-CM | POA: Insufficient documentation

## 2021-01-13 DIAGNOSIS — S8991XA Unspecified injury of right lower leg, initial encounter: Secondary | ICD-10-CM | POA: Diagnosis present

## 2021-01-13 MED ORDER — OXYCODONE-ACETAMINOPHEN 5-325 MG PO TABS
1.0000 | ORAL_TABLET | Freq: Once | ORAL | Status: AC
  Administered 2021-01-13: 1 via ORAL
  Filled 2021-01-13: qty 1

## 2021-01-13 MED ORDER — OXYCODONE-ACETAMINOPHEN 5-325 MG PO TABS: 1.0000 | ORAL_TABLET | Freq: Four times a day (QID) | ORAL | 0 refills | Status: DC | PRN

## 2021-01-13 NOTE — Telephone Encounter (Signed)
Addressed in previous encounter.

## 2021-01-13 NOTE — ED Triage Notes (Signed)
Pt stepped in a hole on Saturday and her knee pain has gotten progressively worse.

## 2021-01-13 NOTE — Discharge Instructions (Addendum)
Please call your orthopedic doctor this morning to schedule follow-up appointment, ideally to be seen within the next couple days.  Recommend using knee immobilizer when up out of bed. Recommend bearing weight as tolerated. For the next day or two strongly recommend resting it as much as possible. Can take knee immobilizer off at night or to shower.  Use crutches as needed for mobility.  Recommend Tylenol and Motrin for pain control.  For breakthrough pain, take the prescribed Percocet as needed.  Note this does contain some Tylenol.  This can make you drowsy should not be taken while driving or operating heavy machinery.  If you feel your pain significantly worsens, swelling worsens or other new concerning symptom, come back to ER for reassessment.

## 2021-01-13 NOTE — ED Provider Notes (Addendum)
[facility] EMERGENCY DEPT Provider Note   CSN: [id number] Arrival date & time: 01/13/21  2993     History Chief Complaint  Patient presents with   Knee Injury    Kaitlyn Kelly is a 60 y.o. female.  Presents to ER with concern for knee injury.  Patient states that she stepped into unexpected hole on Saturday and had knee pain after twisting motion of her knee.  Denies any falls or direct trauma to the knee itself.  Initially pain was mild but has become more severe.  Was originally able to walk without significant difficulty with today having difficulty walking due to the pain.  Pain is improved with rest, worse with movement.  Has tried Motrin at home with minimal relief.  Tried taking dose of husband's leftover hydrocodone with some relief last night.  Has been seen by Dr. Erlinda Hong with ortho for separate issue.  HPI     Past Medical History:  Diagnosis Date   Anal fissure    Anal fissure    Arthritis    Hemorrhoids    Hypertension    Thyroid disease     Patient Active Problem List   Diagnosis Date Noted   Vulvar irritation 71/69/6789   Lichen sclerosus 38/04/1750   Pain in both hands 01/17/2020   Vitamin D deficiency 05/20/2009   ALLERGIC RHINITIS 05/20/2009   ELEVATED BLOOD PRESSURE WITHOUT DIAGNOSIS OF HYPERTENSION 05/20/2009   HASHIMOTO'S THYROIDITIS 12/07/2007   OBESITY, UNSPECIFIED 06/11/2007   Essential hypertension 06/11/2007   Asthma 06/11/2007   HEMATURIA, HX OF 06/11/2007    Past Surgical History:  Procedure Laterality Date   COLONOSCOPY     had a 2014, 2017   TOTAL ABDOMINAL HYSTERECTOMY Left 08/30/2002   TAH/RSO/LO cystectomy for bleeding, bilateral cysts   VULVA [name] BIOPSY  03/08/2020         OB History     Gravida  1   Para  1   Term      Preterm      AB      Living  1      SAB      IAB      Ectopic      Multiple      Live Births  1        Obstetric Comments  SVD x 1         Family History   Problem Relation Age of Onset   Heart disease Mother    Diabetes Sister    Colon cancer Neg Hx    Esophageal cancer Neg Hx    Pancreatic cancer Neg Hx    Prostate cancer Neg Hx    Rectal cancer Neg Hx     Social History   Tobacco Use   Smoking status: Never   Smokeless tobacco: Never  Vaping Use   Vaping Use: Never used  Substance Use Topics   Alcohol use: Yes    Alcohol/week: 1.0 - 2.0 standard drink    Types: 1 - 2 Glasses of wine per week   Drug use: No    Home Medications Prior to Admission medications   Medication Sig Start Date End Date Taking? Authorizing Provider  clobetasol ointment (TEMOVATE) 0.05 % APPLY GENEROUSLY TO VULVA (BOTH SIDES), PERINEUM, AFFECTED AREAS UNTIL 07/18/20: APPLY EVERY OTHER NIGHT AFTER 07/19/20: TWO TIMES WEEKLY. 06/10/20 06/10/21 Yes Aletha Halim, MD  docusate sodium (COLACE) 100 MG capsule Take 100 mg by mouth 2 (two) times daily as needed for  mild constipation.   Yes [provider]  fluticasone-salmeterol (ADVAIR HFA) 45-21 MCG/ACT inhaler Inhale 2 puffs into the lungs 2 (two) times daily.   Yes [provider]  levothyroxine (SYNTHROID) 100 MCG tablet TAKE 1 TABLET BY MOUTH ON AN EMPTY STOMACH IN THE MORNING ONCE A DAY 04/20/20  Yes [place], Modena Nunnery, MD  lisinopril-hydrochlorothiazide (ZESTORETIC) 20-25 MG tablet TAKE 1 TABLET BY MOUTH DAILY. 06/19/20 06/19/21 Yes [place], Modena Nunnery, MD  oxyCODONE-acetaminophen (PERCOCET/ROXICET) 5-325 MG tablet Take 1 tablet by mouth every 6 (six) hours as needed for up to 3 days for severe pain. 01/13/21 01/16/21 Yes Lucrezia Starch, MD  albuterol (VENTOLIN HFA) 108 (90 Base) MCG/ACT inhaler INHALE 2 PUFFS INTO THE LUNGS AS NEEDED EVERY 4 HOURS 11/18/19 11/17/20  Kelton Pillar, MD  levothyroxine (SYNTHROID) 100 MCG tablet TAKE 1 TABLET BY MOUTH ON AN EMPTY STOMACH IN THE MORNING ONCE A DAY 90 04/20/20 04/20/21  Kelton Pillar, MD  predniSONE (DELTASONE) 10 MG tablet TAKE 6 TABLETS BY MOUTH DAILY  AT BREAKFAST X 1 DAY, THEN 5 TABS DAILY X 1 DAY, 4 TABS DAILY X 1 DAY, 3 TABS DAILY X 1 DAY, 2 TABS DAILY X 1 DAY, 1 TAB DAILY 09/30/20 09/30/21  Eulogio Bear, NP    Allergies    Shingrix [zoster vac recomb adjuvanted] and Sulfonamide derivatives  Review of Systems   Review of Systems  Musculoskeletal:  Positive for arthralgias.   Physical Exam Updated Vital Signs BP (!) 154/82 (BP Location: Right Arm)   Pulse 74   Temp 98 F (36.7 C) (Oral)   Resp 17   Ht 5\' 5"  (1.651 m)   Wt 86.2 kg   SpO2 100%   BMI 31.62 kg/m   Physical Exam Vitals and nursing note reviewed.  Constitutional:      General: She is not in acute distress.    Appearance: She is well-developed.  HENT:     Head: Normocephalic and atraumatic.  Eyes:     Conjunctiva/sclera: Conjunctivae normal.  Cardiovascular:     Rate and Rhythm: Normal rate.     Pulses: Normal pulses.  Pulmonary:     Effort: Pulmonary effort is normal. No respiratory distress.  Musculoskeletal:     Cervical back: Neck supple.     Comments: RLE: No significant swelling noted to the knee, there is generalized tenderness, no focal point tenderness over the proximal, no obvious joint laxity; distal DP/PT pulses intact, distal sensation and motor intact  Skin:    General: Skin is warm and dry.  Neurological:     Mental Status: She is alert.     Comments: Right lower extremity: Distal sensation and motor intact  Psychiatric:        Mood and Affect: Mood normal.        Behavior: Behavior normal.    ED Results / Procedures / Treatments   Labs (all labs ordered are listed, but only abnormal results are displayed) Labs Reviewed - No data to display  EKG None  Radiology DG Knee Complete 4 Views Right  Result Date: 01/13/2021 CLINICAL DATA:  Right knee pain, twisting injury EXAM: RIGHT KNEE - COMPLETE 4+ VIEW COMPARISON:  None. FINDINGS: Normal alignment without acute osseous finding or fracture. No subluxation or dislocation.  Preserved joint spaces. Very minor femoral and tibial plateau bony spurring. IMPRESSION: No acute osseous finding.  Minimal degenerative changes. Electronically Signed   By: Jerilynn Mages.  Shick M.D.   On: 01/13/2021 07:48    Procedures  Procedures   Medications Ordered in ED Medications  oxyCODONE-acetaminophen (PERCOCET/ROXICET) 5-325 MG per tablet 1 tablet (1 tablet Oral Given 01/13/21 0301)    ED Course  I have reviewed the triage vital signs and the nursing notes.  Pertinent labs & imaging results that were available during my care of the patient were reviewed by me and considered in my medical decision making (see chart for details).    MDM Rules/Calculators/A&P                          60 year old lady presents to ER with concern for knee injury.  X-rays negative.  Based on description of twisting injury, suspect may have ligamentous injury or strain.  No direct trauma to the knee, was bearing weight, does not have point tenderness over the tibial plateau, low suspicion for occult fracture at present.  Recommend bearing weight as tolerated, using crutches as needed, knee immobilizer for now and close follow-up with Ortho in the outpatient setting.  After the discussed management above, the patient was determined to be safe for discharge.  The patient was in agreement with this plan and all questions regarding their care were answered.  ED return precautions were discussed and the patient will return to the ED with any significant worsening of condition.  Final Clinical Impression(s) / ED Diagnoses Final diagnoses:  Knee strain, right, initial encounter    Rx / DC Orders ED Discharge Orders          Ordered    oxyCODONE-acetaminophen (PERCOCET/ROXICET) 5-325 MG tablet  Every 6 hours PRN        01/13/21 0804             Lucrezia Starch, MD 01/13/21 3143    Lucrezia Starch, MD 01/13/21 [phone]

## 2021-01-15 ENCOUNTER — Encounter: Payer: Self-pay | Admitting: Family

## 2021-01-15 ENCOUNTER — Ambulatory Visit (INDEPENDENT_AMBULATORY_CARE_PROVIDER_SITE_OTHER): Payer: No Typology Code available for payment source | Admitting: Family

## 2021-01-15 DIAGNOSIS — M25561 Pain in right knee: Secondary | ICD-10-CM | POA: Diagnosis not present

## 2021-01-15 MED ORDER — OXYCODONE-ACETAMINOPHEN 5-325 MG PO TABS: 1.0000 | ORAL_TABLET | Freq: Three times a day (TID) | ORAL | 0 refills | Status: AC | PRN

## 2021-01-15 NOTE — Progress Notes (Signed)
Office Visit Note   Patient: Kaitlyn Kelly           Date of Birth: August 30, 1960           MRN: [id number] Visit Date: 01/15/2021              Requested by: Eulogio Bear, NP [address],  [place] [address] PCP: Eulogio Bear, NP  Chief Complaint  Patient presents with   Right Knee - Pain      HPI: The patient is a 60 year old woman who presents today for initial evaluation of right knee pain after an injury.  The patient was walking in her yard when she stepped in a hole and had a twisting injury of her right knee.  She had intense pain that has continued to worsen.  She did have an emergency department for the same injury on June 29.  At that time she was told she did not have a fracture was diagnosed with a knee strain.  She has continued to have issues with pain can hardly ambulate.  She was placed in a knee immobilizer.  She does have associated swelling.  No bruising no redness  Assessment & Plan: Visit Diagnoses: No diagnosis found.  Plan: suspect meniscal injury  Depo-Medrol injection of the right knee.  Patient tolerated well.  Advance weightbearing as she tolerates.  Work on gentle range of motion of the knee.  Follow-Up Instructions: Return in about 3 weeks (around 02/05/2021).   Right Knee Exam   Muscle Strength  The patient has normal right knee strength.  Tenderness  The patient is experiencing tenderness in the medial joint line.  Range of Motion  The patient has normal right knee ROM.  Tests  Varus: negative Valgus: negative  Other  Erythema: absent Sensation: normal Swelling: moderate     Patient is alert, oriented, no adenopathy, well-dressed, normal affect, normal respiratory effort.   Imaging: No results found. No images are attached to the encounter.  Labs: No results found for: HGBA1C, ESRSEDRATE, CRP, LABURIC, REPTSTATUS, GRAMSTAIN, CULT, LABORGA   No results found for: ALBUMIN, PREALBUMIN, CBC  No  results found for: MG No results found for: VD25OH  No results found for: PREALBUMIN CBC EXTENDED Latest Ref Rng & Units 06/15/2020 03/31/2011  WBC 3.8 - 10.8 Thousand/uL 7.1 -  RBC 3.80 - 5.10 Million/uL 5.21(H) -  HGB 11.7 - 15.5 g/dL 14.8 15.6(H)  HCT 35.0 - 45.0 % 44.0 46.0  PLT 140 - 400 Thousand/uL 271 -  NEUTROABS 1,500 - 7,800 cells/uL 4,146 -  LYMPHSABS 850 - 3,900 cells/uL 2,293 -     There is no height or weight on file to calculate BMI.  Orders:  No orders of the defined types were placed in this encounter.  No orders of the defined types were placed in this encounter.    Procedures: Large Joint Inj: R knee on 02/19/2021 9:12 AM Indications: pain Details: 18 G 1.5 in needle, anteromedial approach Medications: 5 mL lidocaine 1 %; 40 mg methylPREDNISolone acetate 40 MG/ML Consent was given by the patient.     Clinical Data: No additional findings.  ROS:  All other systems negative, except as noted in the HPI. Review of Systems  Objective: Vital Signs: There were no vitals taken for this visit.  Specialty Comments:  No specialty comments available.  PMFS History: Patient Active Problem List   Diagnosis Date Noted   Vulvar irritation 70/96/2836   Lichen sclerosus 62/94/7654  Pain in both hands 01/17/2020   Vitamin D deficiency 05/20/2009   ALLERGIC RHINITIS 05/20/2009   ELEVATED BLOOD PRESSURE WITHOUT DIAGNOSIS OF HYPERTENSION 05/20/2009   HASHIMOTO'S THYROIDITIS 12/07/2007   OBESITY, UNSPECIFIED 06/11/2007   Essential hypertension 06/11/2007   Asthma 06/11/2007   HEMATURIA, HX OF 06/11/2007   Past Medical History:  Diagnosis Date   Anal fissure    Anal fissure    Arthritis    Hemorrhoids    Hypertension    Thyroid disease     Family History  Problem Relation Age of Onset   Heart disease Mother    Diabetes Sister    Colon cancer Neg Hx    Esophageal cancer Neg Hx    Pancreatic cancer Neg Hx    Prostate cancer Neg Hx    Rectal cancer  Neg Hx     Past Surgical History:  Procedure Laterality Date   COLONOSCOPY     had a 2014, 2017   TOTAL ABDOMINAL HYSTERECTOMY Left 08/30/2002   TAH/RSO/LO cystectomy for bleeding, bilateral cysts   VULVA /PERINEUM BIOPSY  03/08/2020       Social History   Occupational History   Not on file  Tobacco Use   Smoking status: Never   Smokeless tobacco: Never  Vaping Use   Vaping Use: Never used  Substance and Sexual Activity   Alcohol use: Yes    Alcohol/week: 1.0 - 2.0 standard drink    Types: 1 - 2 Glasses of wine per week   Drug use: No   Sexual activity: Yes

## 2021-01-20 ENCOUNTER — Other Ambulatory Visit (HOSPITAL_COMMUNITY): Payer: Self-pay

## 2021-01-20 MED ORDER — ALBUTEROL SULFATE HFA 108 (90 BASE) MCG/ACT IN AERS
INHALATION_SPRAY | RESPIRATORY_TRACT | 1 refills | Status: DC
  Filled 2021-01-20: qty 18, 17d supply, fill #0
  Filled 2021-04-30: qty 18, 17d supply, fill #1

## 2021-01-29 ENCOUNTER — Other Ambulatory Visit (HOSPITAL_COMMUNITY): Payer: Self-pay

## 2021-01-29 ENCOUNTER — Ambulatory Visit (INDEPENDENT_AMBULATORY_CARE_PROVIDER_SITE_OTHER): Payer: No Typology Code available for payment source | Admitting: Family

## 2021-01-29 DIAGNOSIS — M25561 Pain in right knee: Secondary | ICD-10-CM

## 2021-01-29 NOTE — Progress Notes (Signed)
Office Visit Note   Patient: Kaitlyn Kelly           Date of Birth: 1960-08-18           MRN: [id number] Visit Date: 01/29/2021              Requested by: Eulogio Bear, NP [address],  [place] [address] PCP: Eulogio Bear, NP  Chief Complaint  Patient presents with   Right Knee - Follow-up, Pain      HPI: The patient is a 60 year old woman seen in follow-up for right knee injury.  She had a twist injury when she stepped in a hole its been a little over 2 weeks since her last visit she did have a Depo-Medrol injection at that time she states that she has had modest relief of her pain however she is now able to weight-bear and she is feels that she is much more mobile she is wearing a knee sleeve which she feels provides some support  She has been taking 400 mg of ibuprofen twice daily  Assessment & Plan: Visit Diagnoses: No diagnosis found.  Plan: At this point she would like to proceed with conservative measures will give her 4 more weeks.  If she fails to improve we will consider MRI in about 4 weeks.  Follow-Up Instructions: No follow-ups on file.   Right Knee Exam   Muscle Strength  The patient has normal right knee strength.  Tenderness  The patient is experiencing tenderness in the medial joint line.  Range of Motion  The patient has normal right knee ROM.  Tests  Varus: negative Valgus: negative Lachman:  Anterior - negative    Posterior - negative  Other  Erythema: absent Swelling: moderate Effusion: effusion present     Patient is alert, oriented, no adenopathy, well-dressed, normal affect, normal respiratory effort.   Imaging: No results found. No images are attached to the encounter.  Labs: No results found for: HGBA1C, ESRSEDRATE, CRP, LABURIC, REPTSTATUS, GRAMSTAIN, CULT, LABORGA   No results found for: ALBUMIN, PREALBUMIN, CBC  No results found for: MG No results found for: VD25OH  No results found for:  PREALBUMIN CBC EXTENDED Latest Ref Rng & Units 06/15/2020 03/31/2011  WBC 3.8 - 10.8 Thousand/uL 7.1 -  RBC 3.80 - 5.10 Million/uL 5.21(H) -  HGB 11.7 - 15.5 g/dL 14.8 15.6(H)  HCT 35.0 - 45.0 % 44.0 46.0  PLT 140 - 400 Thousand/uL 271 -  NEUTROABS 1,500 - 7,800 cells/uL 4,146 -  LYMPHSABS 850 - 3,900 cells/uL 2,293 -     There is no height or weight on file to calculate BMI.  Orders:  No orders of the defined types were placed in this encounter.  No orders of the defined types were placed in this encounter.    Procedures: No procedures performed  Clinical Data: No additional findings.  ROS:  All other systems negative, except as noted in the HPI. Review of Systems  Constitutional:  Negative for chills and fever.  Cardiovascular:  Negative for leg swelling.  Musculoskeletal:  Positive for arthralgias and joint swelling.   Objective: Vital Signs: There were no vitals taken for this visit.  Specialty Comments:  No specialty comments available.  PMFS History: Patient Active Problem List   Diagnosis Date Noted   Vulvar irritation 17/51/0258   Lichen sclerosus 52/77/8242   Pain in both hands 01/17/2020   Vitamin D deficiency 05/20/2009   ALLERGIC RHINITIS 05/20/2009   ELEVATED  BLOOD PRESSURE WITHOUT DIAGNOSIS OF HYPERTENSION 05/20/2009   HASHIMOTO'S THYROIDITIS 12/07/2007   OBESITY, UNSPECIFIED 06/11/2007   Essential hypertension 06/11/2007   Asthma 06/11/2007   HEMATURIA, HX OF 06/11/2007   Past Medical History:  Diagnosis Date   Anal fissure    Anal fissure    Arthritis    Hemorrhoids    Hypertension    Thyroid disease     Family History  Problem Relation Age of Onset   Heart disease Mother    Diabetes Sister    Colon cancer Neg Hx    Esophageal cancer Neg Hx    Pancreatic cancer Neg Hx    Prostate cancer Neg Hx    Rectal cancer Neg Hx     Past Surgical History:  Procedure Laterality Date   COLONOSCOPY     had a 2014, 2017   TOTAL ABDOMINAL  HYSTERECTOMY Left 08/30/2002   TAH/RSO/LO cystectomy for bleeding, bilateral cysts   VULVA /PERINEUM BIOPSY  03/08/2020       Social History   Occupational History   Not on file  Tobacco Use   Smoking status: Never   Smokeless tobacco: Never  Vaping Use   Vaping Use: Never used  Substance and Sexual Activity   Alcohol use: Yes    Alcohol/week: 1.0 - 2.0 standard drink    Types: 1 - 2 Glasses of wine per week   Drug use: No   Sexual activity: Yes

## 2021-02-03 ENCOUNTER — Other Ambulatory Visit: Payer: Self-pay | Admitting: Family

## 2021-02-03 DIAGNOSIS — M25561 Pain in right knee: Secondary | ICD-10-CM

## 2021-02-04 ENCOUNTER — Other Ambulatory Visit (HOSPITAL_COMMUNITY): Payer: Self-pay

## 2021-02-04 MED FILL — Levothyroxine Sodium Tab 100 MCG: ORAL | 90 days supply | Qty: 90 | Fill #1 | Status: AC

## 2021-02-04 MED FILL — Lisinopril & Hydrochlorothiazide Tab 20-25 MG: ORAL | 90 days supply | Qty: 90 | Fill #1 | Status: AC

## 2021-02-04 MED FILL — Levothyroxine Sodium Tab 100 MCG: ORAL | 90 days supply | Qty: 90 | Fill #1 | Status: CN

## 2021-02-04 MED FILL — Clobetasol Propionate Oint 0.05%: CUTANEOUS | 30 days supply | Qty: 60 | Fill #1 | Status: AC

## 2021-02-05 ENCOUNTER — Other Ambulatory Visit (HOSPITAL_COMMUNITY): Payer: Self-pay

## 2021-02-06 ENCOUNTER — Ambulatory Visit
Admission: RE | Admit: 2021-02-06 | Discharge: 2021-02-06 | Disposition: A | Discharge status: Home / Self Care | Payer: No Typology Code available for payment source | Source: Ambulatory Visit | Attending: Family | Admitting: Family

## 2021-02-06 ENCOUNTER — Other Ambulatory Visit: Payer: Self-pay

## 2021-02-06 DIAGNOSIS — M25561 Pain in right knee: Secondary | ICD-10-CM

## 2021-02-08 ENCOUNTER — Encounter: Payer: Self-pay | Admitting: Nurse Practitioner

## 2021-02-10 ENCOUNTER — Other Ambulatory Visit: Payer: No Typology Code available for payment source

## 2021-02-14 ENCOUNTER — Telehealth: Payer: No Typology Code available for payment source | Admitting: Nurse Practitioner

## 2021-02-14 ENCOUNTER — Encounter: Payer: Self-pay | Admitting: Nurse Practitioner

## 2021-02-14 DIAGNOSIS — U071 COVID-19: Secondary | ICD-10-CM

## 2021-02-14 MED ORDER — MOLNUPIRAVIR EUA 200MG CAPSULE
4.0000 | ORAL_CAPSULE | Freq: Two times a day (BID) | ORAL | 0 refills | Status: AC
Start: 2021-02-14 — End: 2021-02-19

## 2021-02-14 NOTE — Progress Notes (Signed)
Virtual Visit Consent   ANALYSE [name], you are scheduled for a virtual visit with a [facility] provider today.     Just as with appointments in the office, your consent must be obtained to participate.  Your consent will be active for this visit and any virtual visit you may have with one of our providers in the next 365 days.     If you have a MyChart account, a copy of this consent can be sent to you electronically.  All virtual visits are billed to your insurance company just like a traditional visit in the office.    As this is a virtual visit, video technology does not allow for your provider to perform a traditional examination.  This may limit your provider's ability to fully assess your condition.  If your provider identifies any concerns that need to be evaluated in person or the need to arrange testing (such as labs, EKG, etc.), we will make arrangements to do so.     Although advances in technology are sophisticated, we cannot ensure that it will always work on either your end or our end.  If the connection with a video visit is poor, the visit may have to be switched to a telephone visit.  With either a video or telephone visit, we are not always able to ensure that we have a secure connection.     I need to obtain your verbal consent now.   Are you willing to proceed with your visit today?    NAVEAH NEVEU has provided verbal consent on 02/14/2021 for a virtual visit (video or telephone).   Apolonio Schneiders, FNP   Date: 02/14/2021 4:14 PM   Virtual Visit via Video Note   I, Apolonio Schneiders, connected with  Kaitlyn Kelly  ([id number], 04/29/1961) on 02/14/21 at  4:15 PM EDT by a video-enabled telemedicine application and verified that I am speaking with the correct person using two identifiers.  Location: Patient: Virtual Visit Location Patient: Home Provider: Virtual Visit Location Provider: Office/Clinic   I discussed the limitations of evaluation and management by  telemedicine and the availability of in person appointments. The patient expressed understanding and agreed to proceed.    History of Present Illness: Kaitlyn Kelly is a 60 y.o. who identifies as a female who was assigned female at birth, and is being seen today after testing positive for COVID-19.  She started to have nasal congestion yesterday, today she has started to have more body aches and nasal congestion.   She has been vaccinated with a booster for COVID-19  Denies a history of infection with COVID  Has a history of asthma with inhalers at home Has been using OTC dayquil for relief.   Most recent lab work was 8 months ago    Problems:  Patient Active Problem List   Diagnosis Date Noted   Vulvar irritation [id number]   Lichen sclerosus XX123456   Pain in both hands 01/17/2020   Vitamin D deficiency 05/20/2009   ALLERGIC RHINITIS 05/20/2009   ELEVATED BLOOD PRESSURE WITHOUT DIAGNOSIS OF HYPERTENSION 05/20/2009   HASHIMOTO'S THYROIDITIS 12/07/2007   OBESITY, UNSPECIFIED 06/11/2007   Essential hypertension 06/11/2007   Asthma 06/11/2007   HEMATURIA, HX OF 06/11/2007    Allergies:  Allergies  Allergen Reactions   Shingrix [Zoster Vac Recomb Adjuvanted]     LICHEN'S FLARE   Sulfonamide Derivatives     rash   Current Outpatient Medications  Medication Instructions   albuterol (VENTOLIN HFA)  108 (90 Base) MCG/ACT inhaler INHALE 2 PUFFS INTO THE LUNGS AS NEEDED EVERY 4 HOURS   clobetasol ointment (TEMOVATE) 0.05 % APPLY GENEROUSLY TO VULVA (BOTH SIDES), PERINEUM, AFFECTED AREAS UNTIL 07/18/20: APPLY EVERY OTHER NIGHT AFTER 07/19/20: TWO TIMES WEEKLY.   docusate sodium (COLACE) 100 mg, Oral, 2 times daily PRN   fluticasone-salmeterol (ADVAIR HFA) 45-21 MCG/ACT inhaler 2 puffs, Inhalation, 2 times daily   levothyroxine (SYNTHROID) 100 MCG tablet TAKE 1 TABLET BY MOUTH ON AN EMPTY STOMACH IN THE MORNING ONCE A DAY   lisinopril-hydrochlorothiazide (ZESTORETIC) 20-25 MG  tablet TAKE 1 TABLET BY MOUTH DAILY.   molnupiravir EUA 800 mg, Oral, 2 times daily     Observations/Objective: Patient is well-developed, well-nourished in no acute distress.  Resting comfortably at home.  Head is normocephalic, atraumatic.  No labored breathing.  Speech is clear and coherent with logical content.  Patient is alert and oriented at baseline.    Assessment and Plan: 1. COVID-19  - molnupiravir EUA 200 mg CAPS; Take 4 capsules (800 mg total) by mouth 2 (two) times daily for 5 days.  Dispense: 40 capsule; Refill: 0    Use inhalers as directed- as discussed patient understands and has recent inhalers filled at home  Continue OTC support of symptoms  Rest and maintain hydration, caloric intake high in protein.  Follow up with PCP tomorrow   Follow Up Instructions: I discussed the assessment and treatment plan with the patient. The patient was provided an opportunity to ask questions and all were answered. The patient agreed with the plan and demonstrated an understanding of the instructions.  A copy of instructions were sent to the patient via MyChart.  The patient was advised to call back or seek an in-person evaluation if the symptoms worsen or if the condition fails to improve as anticipated.  Time:  I spent 20 minutes with the patient via telehealth technology discussing the above problems/concerns.    Apolonio Schneiders, FNP

## 2021-02-14 NOTE — Patient Instructions (Signed)
You are being prescribed MOLNUPIRAVIR for COVID-19 infection.  ° ° °Please call the pharmacy or go through the drive through vs going inside if you are picking up the mediation yourself to prevent further spread. If prescribed to a [facility] affiliated pharmacy, a pharmacist will bring the medication out to your car. ° ° °ADMINISTRATION INSTRUCTIONS: °Take with or without food. Swallow the tablets whole. Don't chew, crush, or break the medications because it might not work as well ° °For each dose of the medication, you should be taking FOUR tablets at one time, TWICE a day  ° °Finish your full five-day course of Molnupiravir even if you feel better before you're done. Stopping this medication too early can make it less effective to prevent severe illness related to COVID19.   ° °Molnupiravir is prescribed for YOU ONLY. Don't share it with others, even if they have similar symptoms as you. This medication might not be right for everyone.  ° °Make sure to take steps to protect yourself and others while you're taking this medication in order to get well soon and to prevent others from getting sick with COVID-19. ° ° °**If you are of childbearing potential (any gender) - it is advised to not get pregnant while taking this medication and recommended that condoms are used for female partners the next 3 months after taking the medication out of extreme caution  ° ° °COMMON SIDE EFFECTS: °Diarrhea °Nausea  °Dizziness ° ° ° °If your COVID-19 symptoms get worse, get medical help right away. Call 911 if you experience symptoms such as worsening cough, trouble breathing, chest pain that doesn't go away, confusion, a hard time staying awake, and pale or blue-colored skin. °This medication won't prevent all COVID-19 cases from getting worse.  ° ° °

## 2021-02-15 ENCOUNTER — Other Ambulatory Visit (HOSPITAL_COMMUNITY): Payer: Self-pay

## 2021-02-15 ENCOUNTER — Other Ambulatory Visit: Payer: Self-pay | Admitting: Family

## 2021-02-15 DIAGNOSIS — M25561 Pain in right knee: Secondary | ICD-10-CM

## 2021-02-15 MED ORDER — BENZONATATE 100 MG PO CAPS
100.0000 mg | ORAL_CAPSULE | Freq: Two times a day (BID) | ORAL | 0 refills | Status: DC | PRN
  Filled 2021-02-15: qty 20, 10d supply, fill #0

## 2021-02-19 DIAGNOSIS — M25561 Pain in right knee: Secondary | ICD-10-CM

## 2021-02-19 MED ORDER — METHYLPREDNISOLONE ACETATE 40 MG/ML IJ SUSP
40.0000 mg | INTRAMUSCULAR | Status: AC | PRN
  Administered 2021-02-19: 40 mg via INTRA_ARTICULAR

## 2021-02-19 MED ORDER — LIDOCAINE HCL 1 % IJ SOLN
5.0000 mL | INTRAMUSCULAR | Status: AC | PRN
  Administered 2021-02-19: 5 mL

## 2021-02-25 ENCOUNTER — Telehealth: Payer: Self-pay | Admitting: Orthopedic Surgery

## 2021-02-25 ENCOUNTER — Ambulatory Visit (INDEPENDENT_AMBULATORY_CARE_PROVIDER_SITE_OTHER): Payer: No Typology Code available for payment source | Admitting: Orthopedic Surgery

## 2021-02-25 ENCOUNTER — Other Ambulatory Visit: Payer: Self-pay

## 2021-02-25 ENCOUNTER — Encounter: Payer: Self-pay | Admitting: Orthopedic Surgery

## 2021-02-25 DIAGNOSIS — S83241D Other tear of medial meniscus, current injury, right knee, subsequent encounter: Secondary | ICD-10-CM

## 2021-02-25 DIAGNOSIS — M79641 Pain in right hand: Secondary | ICD-10-CM

## 2021-02-25 DIAGNOSIS — M25561 Pain in right knee: Secondary | ICD-10-CM

## 2021-02-25 DIAGNOSIS — M79642 Pain in left hand: Secondary | ICD-10-CM

## 2021-02-25 NOTE — Telephone Encounter (Signed)
[employer] forms received. To [employer]. 

## 2021-02-26 LAB — VITAMIN D 25 HYDROXY (VIT D DEFICIENCY, FRACTURES): Vit D, 25-Hydroxy: 37 ng/mL (ref 30–100)

## 2021-02-26 NOTE — Progress Notes (Signed)
Vit d ok pls calal thx

## 2021-02-27 ENCOUNTER — Encounter: Payer: Self-pay | Admitting: Orthopedic Surgery

## 2021-02-27 NOTE — Progress Notes (Signed)
Office Visit Note   Patient: Kaitlyn Kelly           Date of Birth: 1961/07/17           MRN: [id number] Visit Date: 02/25/2021 Requested by: Eulogio Bear, NP [facility] [address],  [place] [address] PCP: Eulogio Bear, NP  Subjective: Chief Complaint  Patient presents with   Right Knee - Pain, Follow-up    HPI: Patient is a 60 year old female with right knee pain.  She has had pain since June after stepping into a hole and feeling a pop in her knee.  She tried a cortisone injection 6 weeks ago.  Takes Aleve as needed for pain.  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  She likes to go to the gym.  [place] and she also is a Neurosurgeon.  Has a history of hypertension and thyroid disease.  Also has a history of low vitamin D which has not been checked recently.  Patient is getting over [employer].              ROS: All systems reviewed are negative as they relate to the chief complaint within the history of present illness.  Patient denies  fevers or chills.   Assessment & Plan: Visit Diagnoses:  1. Acute medial meniscus tear of right knee, subsequent encounter     Plan: Impression is meniscal root avulsion right knee with some extrusion of the meniscus and gapping of about 1 cm.  Actually closer to a large radial tear.  Not too much arthritis in the medial compartment and the rest of the knee looks reasonable.  Discussed operative and nonoperative treatment options for [place] for this problem.  Although it is not a true meniscal root avulsion it is a large radial tear which is meniscal root avulsion equivalent.  I would favor an attempt at meniscal repair by anchoring that meniscal flap into the posterior medial tibia.  Risk and benefits of the procedure are discussed including not limited to infection nerve vessel damage incomplete restoration of meniscal cushioning function as well as potential need for more  surgery in the future.  Patient understands the risk and benefits and wishes to proceed.  Extensive nature of the rehabilitative process including limitation of motion not beyond 90 degrees for the first 4 weeks as well as a period of nonweightbearing for 4 weeks is also discussed.  All questions answered.  No personal or family history of DVT or pulmonary embolism.  Does have a history of low vitamin D and she does have a fair amount of edema in the tibial plateau.  Therefore I think a recheck on vitamin D is indicated with possible supplementation if low.  Follow-Up Instructions: No follow-ups on file.   Orders:  No orders of the defined types were placed in this encounter.  No orders of the defined types were placed in this encounter.     Procedures: No procedures performed   Clinical Data: No additional findings.  Objective: Vital Signs: There were no vitals taken for this visit.  Physical Exam:   Constitutional: Patient appears well-developed HEENT:  Head: Normocephalic Eyes:EOM are normal Neck: Normal range of motion Cardiovascular: Normal rate Pulmonary/chest: Effort normal Neurologic: Patient is alert Skin: Skin is warm Psychiatric: Patient has normal mood and affect   Ortho Exam: Ortho exam demonstrates excellent range of motion of the right knee with  intact ACL PCL and collateral ligaments.  Pedal pulses palpable.  No masses lymphadenopathy or skin changes noted in that right knee region.  She does have posterior medial joint line tenderness plus mild effusion.  McMurray testing is equivocal.  Plain radiographs do not really show much arthritis in the medial compartment nor does the MRI scan.  Specialty Comments:  No specialty comments available.  Imaging: No results found.   PMFS History: Patient Active Problem List   Diagnosis Date Noted   Vulvar irritation XX123456   Lichen sclerosus XX123456   Pain in both hands 01/17/2020   Vitamin D deficiency  05/20/2009   ALLERGIC RHINITIS 05/20/2009   ELEVATED BLOOD PRESSURE WITHOUT DIAGNOSIS OF HYPERTENSION 05/20/2009   HASHIMOTO'S THYROIDITIS 12/07/2007   OBESITY, UNSPECIFIED 06/11/2007   Essential hypertension 06/11/2007   Asthma 06/11/2007   HEMATURIA, HX OF 06/11/2007   Past Medical History:  Diagnosis Date   Anal fissure    Anal fissure    Arthritis    Hemorrhoids    Hypertension    Thyroid disease     Family History  Problem Relation Age of Onset   Heart disease Mother    Diabetes Sister    Colon cancer Neg Hx    Esophageal cancer Neg Hx    Pancreatic cancer Neg Hx    Prostate cancer Neg Hx    Rectal cancer Neg Hx     Past Surgical History:  Procedure Laterality Date   COLONOSCOPY     had a 2014, 2017   TOTAL ABDOMINAL HYSTERECTOMY Left 08/30/2002   TAH/RSO/LO cystectomy for bleeding, bilateral cysts   VULVA /PERINEUM BIOPSY  03/08/2020       Social History   Occupational History   Not on file  Tobacco Use   Smoking status: Never   Smokeless tobacco: Never  Vaping Use   Vaping Use: Never used  Substance and Sexual Activity   Alcohol use: Yes    Alcohol/week: 1.0 - 2.0 standard drink    Types: 1 - 2 Glasses of wine per week   Drug use: No   Sexual activity: Yes

## 2021-03-01 ENCOUNTER — Encounter: Payer: Self-pay | Admitting: Orthopedic Surgery

## 2021-03-10 ENCOUNTER — Telehealth: Payer: Self-pay | Admitting: Orthopedic Surgery

## 2021-03-10 NOTE — Telephone Encounter (Signed)
Pt submitted medical release form, and $25.00 payment. Patient states Valinda Party has received FMLA paperwork thru fax. Accepted 03/10/21

## 2021-03-16 ENCOUNTER — Encounter: Payer: Self-pay | Admitting: Nurse Practitioner

## 2021-03-16 NOTE — Pre-Procedure Instructions (Signed)
Surgical Instructions   Your procedure is scheduled on Tuesday, September 13th. Report to [facility] Main Entrance "A" at 09:00 A.M., then check in with the Admitting office. Call this number if you have problems the morning of surgery: [phone]   If you have any questions prior to your surgery date call [phone]: Open Monday-Friday 8am-4pm   Remember: Do not eat after midnight the night before your surgery  You may drink clear liquids until 08:00 AM the morning of your surgery.   Clear liquids allowed are: Water, Non-Citrus Juices (without pulp), Carbonated Beverages, Clear Tea, Black Coffee ONLY (NO MILK, CREAM OR POWDERED CREAMER of any kind), and Gatorade   Patient Instructions  The night before surgery:  No food after midnight. ONLY clear liquids after midnight  The day of surgery (if you do NOT have diabetes):  Drink ONE (1) Pre-Surgery Clear Ensure by 08:00 AM the morning of surgery. Drink in one sitting. Do not sip.  This drink was given to you during your hospital  pre-op appointment visit.  Nothing else to drink after completing the  Pre-Surgery Clear Ensure.         If you have questions, please contact your surgeon's office.      Take these medicines the morning of surgery with A SIP OF WATER  levothyroxine (SYNTHROID)   If needed: albuterol (VENTOLIN HFA)- bring inhaler with you on day of surgery fluticasone-salmeterol (ADVAIR HFA)   As of today, STOP taking any Aspirin (unless otherwise instructed by your surgeon) Aleve, Naproxen, Ibuprofen, Motrin, Advil, Goody's, BC's, all herbal medications, fish oil, and all vitamins.           Do not wear jewelry or makeup Do not wear lotions, powders, perfumes, or deodorant. Do not shave 48 hours prior to surgery.   Do not bring valuables to the hospital. [facility] is not responsible for any belongings or valuables. Do not wear nail polish, gel polish, artificial nails, or any other type of covering on  natural nails including finger and toenails. If patients have artificial nails, gel coating, etc. that need to be removed by a nail salon please have this removed prior to surgery or surgery may need to be canceled/delayed if the surgeon/ anesthesia feels like the patient is unable to be adequately monitored.               Do NOT Smoke (Tobacco/Vaping) or drink Alcohol 24 hours prior to your procedure If you use a CPAP at night, you may bring all equipment for your overnight stay.   Contacts, glasses, dentures or bridgework may not be worn into surgery, please bring cases for these belongings   For patients admitted to the hospital, discharge time will be determined by your treatment team.   Patients discharged the day of surgery will not be allowed to drive home, and someone needs to stay with them for 24 hours.  ONLY 1 SUPPORT PERSON MAY BE PRESENT WHILE YOU ARE IN SURGERY. IF YOU ARE TO BE ADMITTED ONCE YOU ARE IN YOUR ROOM YOU WILL BE ALLOWED TWO (2) VISITORS.  Minor children may have two parents present. Special consideration for safety and communication needs will be reviewed on a case by case basis.  Special instructions:    Oral Hygiene is also important to reduce your risk of infection.  Remember - BRUSH YOUR TEETH THE MORNING OF SURGERY WITH YOUR REGULAR TOOTHPASTE   [facility]- Preparing For Surgery  Before surgery, you can play an  important role. Because skin is not sterile, your skin needs to be as free of germs as possible. You can reduce the number of germs on your skin by washing with CHG (chlorahexidine gluconate) Soap before surgery.  CHG is an antiseptic cleaner which kills germs and bonds with the skin to continue killing germs even after washing.     Please do not use if you have an allergy to CHG or antibacterial soaps. If your skin becomes reddened/irritated stop using the CHG.  Do not shave (including legs and underarms) for at least 48 hours prior to first CHG  shower. It is OK to shave your face.  Please follow these instructions carefully.     Shower the NIGHT BEFORE SURGERY and the MORNING OF SURGERY with CHG Soap.   If you chose to wash your hair, wash your hair first as usual with your normal shampoo. After you shampoo, rinse your hair and body thoroughly to remove the shampoo.  Then [employer] and genitals (private parts) with your normal soap and rinse thoroughly to remove soap.  After that Use CHG Soap as you would any other liquid soap. You can apply CHG directly to the skin and wash gently with a scrungie or a clean washcloth.   Apply the CHG Soap to your body ONLY FROM THE NECK DOWN.  Do not use on open wounds or open sores. Avoid contact with your eyes, ears, mouth and genitals (private parts). Wash Face and genitals (private parts)  with your normal soap.   Wash thoroughly, paying special attention to the area where your surgery will be performed.  Thoroughly rinse your body with warm water from the neck down.  DO NOT shower/wash with your normal soap after using and rinsing off the CHG Soap.  Pat yourself dry with a CLEAN TOWEL.  Wear CLEAN PAJAMAS to bed the night before surgery  Place CLEAN SHEETS on your bed the night before your surgery  DO NOT SLEEP WITH PETS.   Day of Surgery:  Take a shower with CHG soap. Wear Clean/Comfortable clothing the morning of surgery Do not apply any deodorants/lotions.   Remember to brush your teeth WITH YOUR REGULAR TOOTHPASTE.   Please read over the following fact sheets that you were given.

## 2021-03-17 ENCOUNTER — Encounter (HOSPITAL_COMMUNITY): Payer: Self-pay

## 2021-03-17 ENCOUNTER — Encounter (HOSPITAL_COMMUNITY)
Admission: RE | Admit: 2021-03-17 | Discharge: 2021-03-17 | Disposition: A | Discharge status: Home / Self Care | Payer: No Typology Code available for payment source | Source: Ambulatory Visit | Attending: Orthopedic Surgery | Admitting: Orthopedic Surgery

## 2021-03-17 ENCOUNTER — Other Ambulatory Visit: Payer: Self-pay | Admitting: *Deleted

## 2021-03-17 ENCOUNTER — Other Ambulatory Visit: Payer: Self-pay

## 2021-03-17 DIAGNOSIS — I10 Essential (primary) hypertension: Secondary | ICD-10-CM

## 2021-03-17 DIAGNOSIS — E559 Vitamin D deficiency, unspecified: Secondary | ICD-10-CM

## 2021-03-17 DIAGNOSIS — Z01818 Encounter for other preprocedural examination: Secondary | ICD-10-CM | POA: Insufficient documentation

## 2021-03-17 DIAGNOSIS — D649 Anemia, unspecified: Secondary | ICD-10-CM

## 2021-03-17 DIAGNOSIS — E063 Autoimmune thyroiditis: Secondary | ICD-10-CM

## 2021-03-17 DIAGNOSIS — Z13220 Encounter for screening for lipoid disorders: Secondary | ICD-10-CM

## 2021-03-17 HISTORY — DX: Unspecified asthma, uncomplicated: J45.909

## 2021-03-17 HISTORY — DX: Pneumonia, unspecified organism: J18.9

## 2021-03-17 HISTORY — DX: Hypothyroidism, unspecified: E03.9

## 2021-03-17 HISTORY — DX: Personal history of urinary calculi: Z87.442

## 2021-03-17 LAB — BASIC METABOLIC PANEL
Anion gap: 9 (ref 5–15)
BUN: 14 mg/dL (ref 6–20)
CO2: 28 mmol/L (ref 22–32)
Calcium: 9.1 mg/dL (ref 8.9–10.3)
Chloride: 101 mmol/L (ref 98–111)
Creatinine, Ser: 0.95 mg/dL (ref 0.44–1.00)
GFR, Estimated: >60 mL/min (ref >60)
Glucose, Bld: 100 mg/dL — ABNORMAL HIGH (ref 70–99)
Potassium: 3.3 mmol/L — ABNORMAL LOW (ref 3.5–5.1)
Sodium: 138 mmol/L (ref 135–145)

## 2021-03-17 LAB — CBC
HCT: 42.4 % (ref 36.0–46.0)
Hemoglobin: 13.9 g/dL (ref 12.0–15.0)
MCH: 28.4 pg (ref 26.0–34.0)
MCHC: 32.8 g/dL (ref 30.0–36.0)
MCV: 86.5 fL (ref 80.0–100.0)
Platelets: 297 10*3/uL (ref 150–400)
RBC: 4.9 MIL/uL (ref 3.87–5.11)
RDW: 13.8 % (ref 11.5–15.5)
WBC: 6.8 10*3/uL (ref 4.0–10.5)
nRBC: 0 % (ref 0.0–0.2)

## 2021-03-17 NOTE — Progress Notes (Signed)
PCP - Noemi Chapel Cardiologist - denies OB-GYN: Dr. Ilda Basset  PPM/ICD - denies   Chest x-ray - n/a EKG - 03/17/21 Stress Test - denies ECHO - denies Cardiac Cath - denies  Sleep Study - denies   No diabetes  As of today, STOP taking any Aspirin (unless otherwise instructed by your surgeon) Aleve, Naproxen, Ibuprofen, Motrin, Advil, Goody's, BC's, all herbal medications, fish oil, and all vitamins.  ERAS Protcol -yes PRE-SURGERY Ensure or G2- ensure given  COVID TEST- ambulatory surgery, not needed   Anesthesia review: no  Patient denies shortness of breath, fever, cough and chest pain at PAT appointment   All instructions explained to the patient, with a verbal understanding of the material. Patient agrees to go over the instructions while at home for a better understanding. Patient also instructed to self quarantine after being tested for COVID-19. The opportunity to ask questions was provided.

## 2021-03-18 ENCOUNTER — Other Ambulatory Visit (HOSPITAL_COMMUNITY): Payer: Self-pay

## 2021-03-18 ENCOUNTER — Other Ambulatory Visit: Payer: Self-pay

## 2021-03-18 ENCOUNTER — Other Ambulatory Visit: Payer: Self-pay | Admitting: Surgical

## 2021-03-18 MED ORDER — ONDANSETRON HCL 4 MG PO TABS
4.0000 mg | ORAL_TABLET | Freq: Three times a day (TID) | ORAL | 0 refills | Status: DC | PRN
  Filled 2021-03-18: qty 20, 7d supply, fill #0

## 2021-03-18 MED ORDER — OXYCODONE HCL 5 MG PO TABS
5.0000 mg | ORAL_TABLET | ORAL | 0 refills | Status: DC | PRN
  Filled 2021-03-18: qty 25, 5d supply, fill #0

## 2021-03-18 MED ORDER — ASPIRIN 81 MG PO CHEW
81.0000 mg | CHEWABLE_TABLET | Freq: Every day | ORAL | 0 refills | Status: AC
  Filled 2021-03-18: qty 30, 30d supply, fill #0

## 2021-03-18 MED ORDER — MELOXICAM 15 MG PO TABS
15.0000 mg | ORAL_TABLET | Freq: Every day | ORAL | 0 refills | Status: DC
  Filled 2021-03-18: qty 30, 30d supply, fill #0

## 2021-03-18 MED ORDER — METHOCARBAMOL 500 MG PO TABS
500.0000 mg | ORAL_TABLET | Freq: Three times a day (TID) | ORAL | 0 refills | Status: DC | PRN
  Filled 2021-03-18: qty 30, 10d supply, fill #0

## 2021-03-19 ENCOUNTER — Other Ambulatory Visit: Payer: No Typology Code available for payment source

## 2021-03-19 ENCOUNTER — Other Ambulatory Visit: Payer: Self-pay

## 2021-03-19 DIAGNOSIS — Z13220 Encounter for screening for lipoid disorders: Secondary | ICD-10-CM

## 2021-03-19 DIAGNOSIS — I10 Essential (primary) hypertension: Secondary | ICD-10-CM

## 2021-03-19 DIAGNOSIS — D649 Anemia, unspecified: Secondary | ICD-10-CM

## 2021-03-19 DIAGNOSIS — E063 Autoimmune thyroiditis: Secondary | ICD-10-CM

## 2021-03-19 DIAGNOSIS — Z136 Encounter for screening for cardiovascular disorders: Secondary | ICD-10-CM

## 2021-03-20 LAB — LIPID PANEL
Cholesterol: 178 mg/dL (ref <200)
HDL: 48 mg/dL — ABNORMAL LOW (ref >=50)
LDL Cholesterol (Calc): 100 mg/dL (calc) — ABNORMAL HIGH
Non-HDL Cholesterol (Calc): 130 mg/dL (calc) — ABNORMAL HIGH (ref <130)
Total CHOL/HDL Ratio: 3.7 (calc) (ref <5.0)
Triglycerides: 178 mg/dL — ABNORMAL HIGH (ref <150)

## 2021-03-20 LAB — B12 AND FOLATE PANEL
Folate: 11.6 ng/mL
Vitamin B-12: 364 pg/mL (ref 200–1100)

## 2021-03-20 LAB — COMPLETE METABOLIC PANEL WITH GFR
AG Ratio: 1.4 (calc) (ref 1.0–2.5)
ALT: 22 U/L (ref 6–29)
AST: 21 U/L (ref 10–35)
Albumin: 4.1 g/dL (ref 3.6–5.1)
Alkaline phosphatase (APISO): 57 U/L (ref 37–153)
BUN: 19 mg/dL (ref 7–25)
CO2: 29 mmol/L (ref 20–32)
Calcium: 9.8 mg/dL (ref 8.6–10.4)
Chloride: 104 mmol/L (ref 98–110)
Creat: 0.82 mg/dL (ref 0.50–1.03)
Globulin: 3 g/dL (calc) (ref 1.9–3.7)
Glucose, Bld: 94 mg/dL (ref 65–99)
Potassium: 4 mmol/L (ref 3.5–5.3)
Sodium: 142 mmol/L (ref 135–146)
Total Bilirubin: 0.3 mg/dL (ref 0.2–1.2)
Total Protein: 7.1 g/dL (ref 6.1–8.1)
eGFR: 83 mL/min/{1.73_m2} (ref >=60)

## 2021-03-20 LAB — IRON,TIBC AND FERRITIN PANEL
%SAT: 10 % (calc) — ABNORMAL LOW (ref 16–45)
Ferritin: 13 ng/mL — ABNORMAL LOW (ref 16–232)
Iron: 43 ug/dL — ABNORMAL LOW (ref 45–160)
TIBC: 420 mcg/dL (calc) (ref 250–450)

## 2021-03-20 LAB — TSH: TSH: 1.26 mIU/L (ref 0.40–4.50)

## 2021-03-20 LAB — EXTRA LAV TOP TUBE

## 2021-03-26 ENCOUNTER — Ambulatory Visit (INDEPENDENT_AMBULATORY_CARE_PROVIDER_SITE_OTHER): Payer: No Typology Code available for payment source | Admitting: Nurse Practitioner

## 2021-03-26 ENCOUNTER — Other Ambulatory Visit: Payer: Self-pay

## 2021-03-26 ENCOUNTER — Encounter: Payer: Self-pay | Admitting: Nurse Practitioner

## 2021-03-26 VITALS — BP 118/78 | HR 98 | Temp 98.3°F | Ht 65.25 in | Wt 212.6 lb

## 2021-03-26 DIAGNOSIS — I10 Essential (primary) hypertension: Secondary | ICD-10-CM | POA: Diagnosis not present

## 2021-03-26 DIAGNOSIS — R790 Abnormal level of blood mineral: Secondary | ICD-10-CM | POA: Diagnosis not present

## 2021-03-26 DIAGNOSIS — Z0001 Encounter for general adult medical examination with abnormal findings: Secondary | ICD-10-CM

## 2021-03-26 DIAGNOSIS — Z0000 Encounter for general adult medical examination without abnormal findings: Secondary | ICD-10-CM

## 2021-03-26 NOTE — Progress Notes (Signed)
BP 118/78   Pulse 98   Temp 98.3 F (36.8 C) (Oral)   Ht 5' 5.25" (1.657 m)   Wt 212 lb 9.6 oz (96.4 kg)   SpO2 97%   BMI 35.11 kg/m    Subjective:    Patient ID: Kaitlyn Kelly, female    DOB: 1961-01-20, 60 y.o.   MRN: [id number]  HPI: Kaitlyn Kelly is a 60 y.o. female presenting on 03/26/2021 for comprehensive medical examination. Current medical complaints include:  Recently had COVID-19.  Went about 1 month without eating or drinking much because her taste was off.  This has gotten better.  She recently suffered a meniscal tear and is having surgery next week.    Also recently tried to give blood and her hemoglobin reading was low at first but when they repeated it, was normal.  Requested to have anemia panel with blood work for physical.  She started taking oral iron but this caused severe upset stomach.  She denies any current bleeding.  HYPERTENSION Currently taking lisinopril-HCTZ 20-25 for blood pressure and tolerating well.   Hypertension status: controlled  Satisfied with current treatment? yes Duration of hypertension: chronic BP monitoring frequency:  rarely BP medication side effects:  no Medication compliance: excellent Aspirin: yes Recurrent headaches: no Visual changes: no Palpitations: no Dyspnea: no Chest pain: no Lower extremity edema: no Dizzy/lightheaded: no  Elevated cholesterol - discussed below The 10-year ASCVD risk score (Arnett DK, et al., 2019) is: 3.1%   Values used to calculate the score:     Age: 69 years     Sex: Female     Is Non-Hispanic African American: No     Diabetic: No     Tobacco smoker: No     Systolic Blood Pressure: 026 mmHg     Is BP treated: Yes     HDL Cholesterol: 48 mg/dL     Total Cholesterol: 178 mg/dL  She currently lives with: husband LMP: post menopausal   Depression Screen done today and results listed below:  Depression screen [facility] 2/9 03/26/2021 06/10/2020 03/04/2020 03/02/2020  Decreased Interest 0  0 0 0  Down, Depressed, Hopeless 0 0 0 0  PHQ - 2 Score 0 0 0 0  Altered sleeping - 0 0 -  Tired, decreased energy - 0 0 -  Change in appetite - 0 0 -  Feeling bad or failure about yourself  - 0 0 -  Trouble concentrating - 0 0 -  Moving slowly or fidgety/restless - 0 0 -  Suicidal thoughts - 0 0 -  PHQ-9 Score - 0 0 -    The patient does not have a history of falls. I did not complete a risk assessment for falls. A plan of care for falls was not documented.   Past Medical History:  Past Medical History:  Diagnosis Date   Anal fissure    Anal fissure    Arthritis    Asthma    Hemorrhoids    History of kidney stones    Hypertension    Hypothyroidism    Pneumonia    Thyroid disease     Surgical History:  Past Surgical History:  Procedure Laterality Date   COLONOSCOPY     had a 2014, 2017   TOTAL ABDOMINAL HYSTERECTOMY Left 08/30/2002   TAH/RSO/LO cystectomy for bleeding, bilateral cysts   TUBAL LIGATION     VULVA /PERINEUM BIOPSY  03/08/2020        Medications:  Current Outpatient  Medications on File Prior to Visit  Medication Sig   albuterol (VENTOLIN HFA) 108 (90 Base) MCG/ACT inhaler INHALE 2 PUFFS INTO THE LUNGS AS NEEDED EVERY 4 HOURS   aspirin (ASPIRIN CHILDRENS) 81 MG chewable tablet Chew 1 tablet (81 mg total) by mouth daily.   clobetasol ointment (TEMOVATE) 0.05 % APPLY GENEROUSLY TO VULVA (BOTH SIDES), PERINEUM, AFFECTED AREAS UNTIL 07/18/20: APPLY EVERY OTHER NIGHT AFTER 07/19/20: TWO TIMES WEEKLY. (Patient taking differently: Apply 1 application topically daily as needed (lichen flare).)   docusate sodium (COLACE) 100 MG capsule Take 100 mg by mouth daily.   fluticasone-salmeterol (ADVAIR HFA) 45-21 MCG/ACT inhaler Inhale 2 puffs into the lungs 2 (two) times daily as needed (shortness of breath).   levothyroxine (SYNTHROID) 100 MCG tablet TAKE 1 TABLET BY MOUTH ON AN EMPTY STOMACH IN THE MORNING ONCE A DAY   lisinopril-hydrochlorothiazide (ZESTORETIC) 20-25  MG tablet TAKE 1 TABLET BY MOUTH DAILY.   meloxicam (MOBIC) 15 MG tablet Take 1 tablet (15 mg total) by mouth daily.   methocarbamol (ROBAXIN) 500 MG tablet Take 1 tablet (500 mg total) by mouth every 8 (eight) hours as needed.   ondansetron (ZOFRAN) 4 MG tablet Take 1 tablet (4 mg total) by mouth every 8 (eight) hours as needed for nausea or vomiting.   oxyCODONE (ROXICODONE) 5 MG immediate release tablet Take 1 tablet (5 mg total) by mouth every 4 (four) hours as needed.   No current facility-administered medications on file prior to visit.    Allergies:  Allergies  Allergen Reactions   Shingrix [Zoster Vac Recomb Adjuvanted]     LICHEN'S FLARE   Sulfonamide Derivatives Rash    Social History:  Social History   Socioeconomic History   Marital status: Married    Spouse name: Not on file   Number of children: Not on file   Years of education: Not on file   Highest education level: Not on file  Occupational History   Not on file  Tobacco Use   Smoking status: Never   Smokeless tobacco: Never  Vaping Use   Vaping Use: Never used  Substance and Sexual Activity   Alcohol use: Yes    Alcohol/week: 1.0 - 2.0 standard drink    Types: 1 - 2 Glasses of wine per week   Drug use: No   Sexual activity: Yes  Other Topics Concern   Not on file  Social History Narrative   Not on file   Social Determinants of Health   Financial Resource Strain: Not on file  Food Insecurity: Not on file  Transportation Needs: Not on file  Physical Activity: Not on file  Stress: Not on file  Social Connections: Not on file  Intimate Partner Violence: Not on file   Social History   Tobacco Use  Smoking Status Never  Smokeless Tobacco Never   Social History   Substance and Sexual Activity  Alcohol Use Yes   Alcohol/week: 1.0 - 2.0 standard drink   Types: 1 - 2 Glasses of wine per week    Family History:  Family History  Problem Relation Age of Onset   Heart disease Mother     Diabetes Sister    Colon cancer Neg Hx    Esophageal cancer Neg Hx    Pancreatic cancer Neg Hx    Prostate cancer Neg Hx    Rectal cancer Neg Hx     Past medical history, surgical history, medications, allergies, family history and social history reviewed with patient today  and changes made to appropriate areas of the chart.   Review of Systems  Constitutional: Negative.   HENT: Negative.    Eyes: Negative.   Respiratory: Negative.    Cardiovascular: Negative.   Gastrointestinal: Negative.   Genitourinary: Negative.   Musculoskeletal:  Positive for joint pain (right knee pain - meniscal tear).  Skin: Negative.   Neurological: Negative.   Psychiatric/Behavioral: Negative.        Objective:    BP 118/78   Pulse 98   Temp 98.3 F (36.8 C) (Oral)   Ht 5' 5.25" (1.657 m)   Wt 212 lb 9.6 oz (96.4 kg)   SpO2 97%   BMI 35.11 kg/m   Wt Readings from Last 3 Encounters:  03/26/21 212 lb 9.6 oz (96.4 kg)  03/17/21 214 lb 12.8 oz (97.4 kg)  01/13/21 190 lb (86.2 kg)    Physical Exam Vitals and nursing note reviewed.  Constitutional:      General: She is not in acute distress.    Appearance: Normal appearance. She is obese. She is not ill-appearing or toxic-appearing.  HENT:     Head: Normocephalic and atraumatic.     Right Ear: Tympanic membrane, ear canal and external ear normal.     Left Ear: Tympanic membrane, ear canal and external ear normal.     Nose: Nose normal. No congestion or rhinorrhea.     Mouth/Throat:     Mouth: Mucous membranes are moist.     Pharynx: Oropharynx is clear. No oropharyngeal exudate.  Eyes:     General: No scleral icterus.    Extraocular Movements: Extraocular movements intact.     Pupils: Pupils are equal, round, and reactive to light.  Cardiovascular:     Rate and Rhythm: Normal rate and regular rhythm.     Pulses: Normal pulses.     Heart sounds: Normal heart sounds. No murmur heard. Pulmonary:     Effort: Pulmonary effort is normal.  No respiratory distress.     Breath sounds: No wheezing or rhonchi.  Chest:  Breasts:    Right: Normal. No inverted nipple, mass, nipple discharge or skin change.     Left: Normal. No inverted nipple, mass, nipple discharge or skin change.  Abdominal:     General: Abdomen is flat. Bowel sounds are normal. There is no distension.     Palpations: Abdomen is soft.     Tenderness: There is no abdominal tenderness.  Musculoskeletal:        General: No swelling or tenderness. Normal range of motion.     Cervical back: Normal range of motion and neck supple. No rigidity or tenderness.     Right knee: Swelling present.     Right lower leg: No edema.     Left lower leg: No edema.  Skin:    General: Skin is warm and dry.     Capillary Refill: Capillary refill takes less than 2 seconds.     Coloration: Skin is not jaundiced or pale.  Neurological:     General: No focal deficit present.     Mental Status: She is alert and oriented to person, place, and time.     Motor: No weakness.     Gait: Gait normal.  Psychiatric:        Mood and Affect: Mood normal.        Behavior: Behavior normal.        Thought Content: Thought content normal.        Judgment: Judgment  normal.    Results for orders placed or performed in visit on 03/19/21  B12 and Folate Panel  Result Value Ref Range   Vitamin B-12 364 200 - 1,100 pg/mL   Folate 11.6 ng/mL  COMPLETE METABOLIC PANEL WITH GFR  Result Value Ref Range   Glucose, Bld 94 65 - 99 mg/dL   BUN 19 7 - 25 mg/dL   Creat 0.82 0.50 - 1.03 mg/dL   eGFR 83 > OR = 60 mL/min/1.36m   BUN/Creatinine Ratio NOT APPLICABLE 6 - 22 (calc)   Sodium 142 135 - 146 mmol/L   Potassium 4.0 3.5 - 5.3 mmol/L   Chloride 104 98 - 110 mmol/L   CO2 29 20 - 32 mmol/L   Calcium 9.8 8.6 - 10.4 mg/dL   Total Protein 7.1 6.1 - 8.1 g/dL   Albumin 4.1 3.6 - 5.1 g/dL   Globulin 3.0 1.9 - 3.7 g/dL (calc)   AG Ratio 1.4 1.0 - 2.5 (calc)   Total Bilirubin 0.3 0.2 - 1.2 mg/dL    Alkaline phosphatase (APISO) 57 37 - 153 U/L   AST 21 10 - 35 U/L   ALT 22 6 - 29 U/L  Lipid panel  Result Value Ref Range   Cholesterol 178 <200 mg/dL   HDL 48 (L) > OR = 50 mg/dL   Triglycerides 178 (H) <150 mg/dL   LDL Cholesterol (Calc) 100 (H) mg/dL (calc)   Total CHOL/HDL Ratio 3.7 <5.0 (calc)   Non-HDL Cholesterol (Calc) 130 (H) <130 mg/dL (calc)  Iron, TIBC and Ferritin Panel  Result Value Ref Range   Iron 43 (L) 45 - 160 mcg/dL   TIBC 420 250 - 450 mcg/dL (calc)   %SAT 10 (L) 16 - 45 % (calc)   Ferritin 13 (L) 16 - 232 ng/mL  TSH  Result Value Ref Range   TSH 1.26 0.40 - 4.50 mIU/L  EXTRA LAV TOP TUBE  Result Value Ref Range   EXTRA LAVENDER-TOP TUBE        Assessment & Plan:   Problem List Items Addressed This Visit       Cardiovascular and Mediastinum   Essential hypertension    Chronic.  BP well controlled today in clinic.  Recent kidney function and electrolytes stable.  Plan to continue lisinopril-hydrochlorothiazide 20-25 daily.  Recheck electrolytes and kidney function in 6 months.  Discussed The 10-year ASCVD risk score (Arnett DK, et al., 2019) is: 3.1%   Values used to calculate the score:     Age: [age]years     Sex: Female     Is Non-Hispanic African American: No     Diabetic: No     Tobacco smoker: No     Systolic Blood Pressure: 1153mmHg     Is BP treated: Yes     HDL Cholesterol: 48 mg/dL     Total Cholesterol: 178 mg/dL Risk today-no statin indicated at this time.  Try to continue working on dietary and lifestyle changes including low-salt, low saturated fat diet.  Follow-up in 6 months.      Other Visit Diagnoses     Annual physical exam    -  Primary   Low ferritin       Acute.  Patient is not currently anemic.  Denies any active bleeding.  Cancer screenings are up-to-date.  we will plan to recheck in 2-3 months.   Relevant Orders   CBC with Differential/Platelet   Iron, TIBC and Ferritin Panel  Follow up plan: Return for 2-3  months recheck iron/cbc.   LABORATORY TESTING:  - Pap smear: done elsewhere  IMMUNIZATIONS:   - Tdap: Tetanus vaccination status reviewed: last tetanus booster within 10 years; she will request records from [employer]. - Influenza:  plans to get with employer - Pneumovax: Not applicable - Prevnar: Not applicable - HPV: Not applicable - Zostavax vaccine:  She has received first dose and plans to get second dose after surgery - COVID-19 vaccine: has had 2 shots and booster, had covid last month and planning to get booster in November   SCREENING: -Mammogram: Up to date  - Colonoscopy: Up to date  - Bone Density: Not applicable  -Hearing Test: Not applicable  -Spirometry: Not applicable   PATIENT COUNSELING:   Sexuality: Discussed sexually transmitted diseases, partner selection, use of condoms, avoidance of unintended pregnancy  and contraceptive alternatives.   Advised to avoid cigarette smoking.  I discussed with the patient that most people either abstain from alcohol or drink within safe limits (<=14/week and <=4 drinks/occasion for males, <=7/weeks and <= 3 drinks/occasion for females) and that the risk for alcohol disorders and other health effects rises proportionally with the number of drinks per week and how often a drinker exceeds daily limits.  Discussed cessation/primary prevention of drug use and availability of treatment for abuse.   Diet: Encouraged to adjust caloric intake to maintain  or achieve ideal body weight, to reduce intake of dietary saturated fat and total fat, to limit sodium intake by avoiding high sodium foods and not adding table salt, and to maintain adequate dietary potassium and calcium preferably from fresh fruits, vegetables, and low-fat dairy products.    stressed the importance of regular exercise  Injury prevention: Discussed safety belts, safety helmets, smoke detector, smoking near bedding or upholstery.   Dental health: Discussed importance of  regular tooth brushing, flossing, and dental visits.    NEXT PREVENTATIVE PHYSICAL DUE IN 1 YEAR. Return for 2-3 months recheck iron/cbc.

## 2021-03-30 ENCOUNTER — Encounter (HOSPITAL_COMMUNITY)
Admission: RE | Disposition: A | Discharge status: Home / Self Care | Payer: Self-pay | Source: Ambulatory Visit | Attending: Orthopedic Surgery

## 2021-03-30 ENCOUNTER — Encounter (HOSPITAL_COMMUNITY): Payer: Self-pay | Admitting: Orthopedic Surgery

## 2021-03-30 ENCOUNTER — Ambulatory Visit (HOSPITAL_COMMUNITY)
Admission: RE | Admit: 2021-03-30 | Discharge: 2021-03-30 | Disposition: A | Discharge status: Home / Self Care | Payer: No Typology Code available for payment source | Source: Ambulatory Visit | Attending: Orthopedic Surgery | Admitting: Orthopedic Surgery

## 2021-03-30 ENCOUNTER — Ambulatory Visit (HOSPITAL_COMMUNITY): Payer: No Typology Code available for payment source | Admitting: Anesthesiology

## 2021-03-30 ENCOUNTER — Encounter: Payer: Self-pay | Admitting: Orthopedic Surgery

## 2021-03-30 ENCOUNTER — Other Ambulatory Visit: Payer: Self-pay

## 2021-03-30 DIAGNOSIS — Z7982 Long term (current) use of aspirin: Secondary | ICD-10-CM | POA: Diagnosis not present

## 2021-03-30 DIAGNOSIS — X58XXXA Exposure to other specified factors, initial encounter: Secondary | ICD-10-CM | POA: Insufficient documentation

## 2021-03-30 DIAGNOSIS — Z79899 Other long term (current) drug therapy: Secondary | ICD-10-CM | POA: Insufficient documentation

## 2021-03-30 DIAGNOSIS — Z79890 Hormone replacement therapy (postmenopausal): Secondary | ICD-10-CM | POA: Insufficient documentation

## 2021-03-30 DIAGNOSIS — S83241D Other tear of medial meniscus, current injury, right knee, subsequent encounter: Secondary | ICD-10-CM | POA: Diagnosis not present

## 2021-03-30 DIAGNOSIS — Z882 Allergy status to sulfonamides status: Secondary | ICD-10-CM | POA: Diagnosis not present

## 2021-03-30 DIAGNOSIS — Z791 Long term (current) use of non-steroidal anti-inflammatories (NSAID): Secondary | ICD-10-CM | POA: Insufficient documentation

## 2021-03-30 DIAGNOSIS — S83241A Other tear of medial meniscus, current injury, right knee, initial encounter: Secondary | ICD-10-CM | POA: Diagnosis not present

## 2021-03-30 HISTORY — PX: KNEE ARTHROSCOPY WITH MENISCAL REPAIR: SHX5653

## 2021-03-30 SURGERY — ARTHROSCOPY, KNEE, WITH MENISCUS REPAIR
Anesthesia: General | Site: Knee | Laterality: Right

## 2021-03-30 MED ORDER — FENTANYL CITRATE (PF) 100 MCG/2ML IJ SOLN
INTRAMUSCULAR | Status: AC
  Filled 2021-03-30: qty 2

## 2021-03-30 MED ORDER — CEFAZOLIN SODIUM-DEXTROSE 2-4 GM/100ML-% IV SOLN
2.0000 g | INTRAVENOUS | Status: AC
  Administered 2021-03-30: 2 g via INTRAVENOUS
  Filled 2021-03-30: qty 100

## 2021-03-30 MED ORDER — ONDANSETRON HCL 4 MG/2ML IJ SOLN
4.0000 mg | Freq: Once | INTRAMUSCULAR | Status: AC | PRN
  Administered 2021-03-30: 4 mg via INTRAVENOUS

## 2021-03-30 MED ORDER — MORPHINE SULFATE (PF) 4 MG/ML IV SOLN
INTRAVENOUS | Status: AC
  Filled 2021-03-30: qty 2

## 2021-03-30 MED ORDER — PHENYLEPHRINE HCL-NACL 20-0.9 MG/250ML-% IV SOLN
INTRAVENOUS | Status: DC | PRN
  Administered 2021-03-30: 25 ug/min via INTRAVENOUS

## 2021-03-30 MED ORDER — PROPOFOL 10 MG/ML IV BOLUS
INTRAVENOUS | Status: AC
  Filled 2021-03-30: qty 20

## 2021-03-30 MED ORDER — BUPIVACAINE-EPINEPHRINE 0.5% -1:200000 IJ SOLN
INTRAMUSCULAR | Status: AC
  Filled 2021-03-30: qty 1

## 2021-03-30 MED ORDER — CLONIDINE HCL (ANALGESIA) 100 MCG/ML EP SOLN
EPIDURAL | Status: DC | PRN
  Administered 2021-03-30: 1 mL

## 2021-03-30 MED ORDER — ACETAMINOPHEN 160 MG/5ML PO SOLN: 325.0000 mg | ORAL | Status: DC | PRN

## 2021-03-30 MED ORDER — LIDOCAINE 2% (20 MG/ML) 5 ML SYRINGE
INTRAMUSCULAR | Status: AC
  Filled 2021-03-30: qty 5

## 2021-03-30 MED ORDER — OXYCODONE HCL 5 MG/5ML PO SOLN: 5.0000 mg | Freq: Once | ORAL | Status: DC | PRN

## 2021-03-30 MED ORDER — PHENYLEPHRINE 40 MCG/ML (10ML) SYRINGE FOR IV PUSH (FOR BLOOD PRESSURE SUPPORT)
PREFILLED_SYRINGE | INTRAVENOUS | Status: AC
  Filled 2021-03-30: qty 10

## 2021-03-30 MED ORDER — PROPOFOL 10 MG/ML IV BOLUS
INTRAVENOUS | Status: DC | PRN
  Administered 2021-03-30: 200 mg via INTRAVENOUS

## 2021-03-30 MED ORDER — POVIDONE-IODINE 10 % EX SWAB
2.0000 "application " | Freq: Once | CUTANEOUS | Status: AC
  Administered 2021-03-30: 2 via TOPICAL

## 2021-03-30 MED ORDER — DEXAMETHASONE SODIUM PHOSPHATE 10 MG/ML IJ SOLN
INTRAMUSCULAR | Status: DC | PRN
Start: 2021-03-30 — End: 2021-03-30
  Administered 2021-03-30: 4 mg via INTRAVENOUS

## 2021-03-30 MED ORDER — BUPIVACAINE-EPINEPHRINE (PF) 0.5% -1:200000 IJ SOLN
INTRAMUSCULAR | Status: DC | PRN
  Administered 2021-03-30: 20 mL via PERINEURAL

## 2021-03-30 MED ORDER — OXYCODONE HCL 5 MG PO TABS: 5.0000 mg | ORAL_TABLET | Freq: Once | ORAL | Status: DC | PRN

## 2021-03-30 MED ORDER — FENTANYL CITRATE (PF) 250 MCG/5ML IJ SOLN
INTRAMUSCULAR | Status: DC | PRN
  Administered 2021-03-30: 25 ug via INTRAVENOUS
  Administered 2021-03-30: 50 ug via INTRAVENOUS
  Administered 2021-03-30 (×2): 25 ug via INTRAVENOUS
  Administered 2021-03-30 (×2): 50 ug via INTRAVENOUS
  Administered 2021-03-30: 25 ug via INTRAVENOUS

## 2021-03-30 MED ORDER — MORPHINE SULFATE (PF) 4 MG/ML IV SOLN
INTRAVENOUS | Status: DC | PRN
  Administered 2021-03-30: 8 mg via INTRAVENOUS

## 2021-03-30 MED ORDER — EPINEPHRINE PF 1 MG/ML IJ SOLN
INTRAMUSCULAR | Status: AC
  Filled 2021-03-30: qty 4

## 2021-03-30 MED ORDER — LIDOCAINE 2% (20 MG/ML) 5 ML SYRINGE
INTRAMUSCULAR | Status: DC | PRN
  Administered 2021-03-30: 60 mg via INTRAVENOUS

## 2021-03-30 MED ORDER — POVIDONE-IODINE 10 % EX SWAB: 2.0000 "application " | Freq: Once | CUTANEOUS | Status: DC

## 2021-03-30 MED ORDER — DEXAMETHASONE SODIUM PHOSPHATE 10 MG/ML IJ SOLN
INTRAMUSCULAR | Status: AC
  Filled 2021-03-30: qty 1

## 2021-03-30 MED ORDER — ACETAMINOPHEN 10 MG/ML IV SOLN
INTRAVENOUS | Status: AC
  Filled 2021-03-30: qty 100

## 2021-03-30 MED ORDER — CHLORHEXIDINE GLUCONATE 0.12 % MT SOLN
15.0000 mL | Freq: Once | OROMUCOSAL | Status: AC
  Administered 2021-03-30: 15 mL via OROMUCOSAL
  Filled 2021-03-30: qty 15

## 2021-03-30 MED ORDER — ACETAMINOPHEN 325 MG PO TABS: 325.0000 mg | ORAL_TABLET | ORAL | Status: DC | PRN

## 2021-03-30 MED ORDER — MIDAZOLAM HCL 2 MG/2ML IJ SOLN: 2.0000 mg | Freq: Once | INTRAMUSCULAR | Status: AC

## 2021-03-30 MED ORDER — FENTANYL CITRATE (PF) 100 MCG/2ML IJ SOLN
25.0000 ug | INTRAMUSCULAR | Status: DC | PRN
  Administered 2021-03-30: 50 ug via INTRAVENOUS

## 2021-03-30 MED ORDER — POVIDONE-IODINE 7.5 % EX SOLN
Freq: Once | CUTANEOUS | Status: DC
  Filled 2021-03-30: qty 118

## 2021-03-30 MED ORDER — MIDAZOLAM HCL 5 MG/5ML IJ SOLN
INTRAMUSCULAR | Status: DC | PRN
  Administered 2021-03-30: 2 mg via INTRAVENOUS

## 2021-03-30 MED ORDER — MIDAZOLAM HCL 2 MG/2ML IJ SOLN
INTRAMUSCULAR | Status: AC
  Filled 2021-03-30: qty 2

## 2021-03-30 MED ORDER — FENTANYL CITRATE (PF) 100 MCG/2ML IJ SOLN: 100.0000 ug | Freq: Once | INTRAMUSCULAR | Status: AC

## 2021-03-30 MED ORDER — BUPIVACAINE HCL 0.25 % IJ SOLN
INTRAMUSCULAR | Status: DC | PRN
  Administered 2021-03-30 (×2): 10 mL

## 2021-03-30 MED ORDER — MEPERIDINE HCL 25 MG/ML IJ SOLN: 6.2500 mg | INTRAMUSCULAR | Status: DC | PRN

## 2021-03-30 MED ORDER — CLONIDINE HCL (ANALGESIA) 100 MCG/ML EP SOLN
EPIDURAL | Status: AC
  Filled 2021-03-30: qty 10

## 2021-03-30 MED ORDER — ONDANSETRON HCL 4 MG/2ML IJ SOLN
INTRAMUSCULAR | Status: AC
  Filled 2021-03-30: qty 2

## 2021-03-30 MED ORDER — BUPIVACAINE-EPINEPHRINE (PF) 0.25% -1:200000 IJ SOLN
INTRAMUSCULAR | Status: AC
  Filled 2021-03-30: qty 30

## 2021-03-30 MED ORDER — SODIUM CHLORIDE 0.9 % IR SOLN
Status: DC | PRN
  Administered 2021-03-30 (×3): 1 mL

## 2021-03-30 MED ORDER — FENTANYL CITRATE (PF) 250 MCG/5ML IJ SOLN
INTRAMUSCULAR | Status: AC
  Filled 2021-03-30: qty 5

## 2021-03-30 MED ORDER — ORAL CARE MOUTH RINSE: 15.0000 mL | Freq: Once | OROMUCOSAL | Status: AC

## 2021-03-30 MED ORDER — ACETAMINOPHEN 10 MG/ML IV SOLN
INTRAVENOUS | Status: DC | PRN
  Administered 2021-03-30: 1000 mg via INTRAVENOUS

## 2021-03-30 MED ORDER — FENTANYL CITRATE (PF) 100 MCG/2ML IJ SOLN
INTRAMUSCULAR | Status: AC
  Administered 2021-03-30: 100 ug via INTRAVENOUS
  Filled 2021-03-30: qty 2

## 2021-03-30 MED ORDER — ONDANSETRON HCL 4 MG/2ML IJ SOLN
INTRAMUSCULAR | Status: DC | PRN
  Administered 2021-03-30: 4 mg via INTRAVENOUS

## 2021-03-30 MED ORDER — LACTATED RINGERS IV SOLN: INTRAVENOUS | Status: DC

## 2021-03-30 MED ORDER — MIDAZOLAM HCL 2 MG/2ML IJ SOLN
INTRAMUSCULAR | Status: AC
  Administered 2021-03-30: 2 mg via INTRAVENOUS
  Filled 2021-03-30: qty 2

## 2021-03-30 MED ORDER — SODIUM CHLORIDE 0.9 % IR SOLN
Status: DC | PRN
  Administered 2021-03-30: 1000 mL

## 2021-03-30 SURGICAL SUPPLY — 69 items
ALCOHOL 70% 16 OZ (MISCELLANEOUS) ×2 IMPLANT
BAG COUNTER SPONGE SURGICOUNT (BAG) IMPLANT
BAG SPNG CNTER NS LX DISP (BAG)
BANDAGE ESMARK 6X9 LF (GAUZE/BANDAGES/DRESSINGS) IMPLANT
BLADE CLIPPER SURG (BLADE) IMPLANT
BLADE EXCALIBUR 4.0X13 (MISCELLANEOUS) ×2 IMPLANT
BLADE SURG 10 STRL SS (BLADE) ×2 IMPLANT
BLADE SURG 15 STRL LF DISP TIS (BLADE) ×1 IMPLANT
BLADE SURG 15 STRL SS (BLADE) ×2
BNDG CMPR 9X6 STRL LF SNTH (GAUZE/BANDAGES/DRESSINGS)
BNDG ELASTIC 6X5.8 VLCR STR LF (GAUZE/BANDAGES/DRESSINGS) ×2 IMPLANT
BNDG ESMARK 6X9 LF (GAUZE/BANDAGES/DRESSINGS)
COOLER ICEMAN CLASSIC (MISCELLANEOUS) ×2 IMPLANT
COVER SURGICAL LIGHT HANDLE (MISCELLANEOUS) ×2 IMPLANT
CUFF TOURN SGL QUICK 34 (TOURNIQUET CUFF)
CUFF TOURN SGL QUICK 42 (TOURNIQUET CUFF) IMPLANT
CUFF TRNQT CYL 34X4.125X (TOURNIQUET CUFF) IMPLANT
DRAPE ARTHROSCOPY W/POUCH 114 (DRAPES) ×2 IMPLANT
DRAPE HALF SHEET 40X57 (DRAPES) IMPLANT
DRAPE INCISE IOBAN 66X45 STRL (DRAPES) IMPLANT
DRAPE U-SHAPE 47X51 STRL (DRAPES) ×2 IMPLANT
DRSG TEGADERM 4X4.75 (GAUZE/BANDAGES/DRESSINGS) ×6 IMPLANT
DURAPREP 26ML APPLICATOR (WOUND CARE) ×4 IMPLANT
DW OUTFLOW CASSETTE/TUBE SET (MISCELLANEOUS) ×2 IMPLANT
ELECT REM PT RETURN 9FT ADLT (ELECTROSURGICAL) ×2
ELECTRODE REM PT RTRN 9FT ADLT (ELECTROSURGICAL) ×1 IMPLANT
GAUZE SPONGE 4X4 12PLY STRL (GAUZE/BANDAGES/DRESSINGS) IMPLANT
GAUZE XEROFORM 1X8 LF (GAUZE/BANDAGES/DRESSINGS) ×2 IMPLANT
GLOVE SRG 8 PF TXTR STRL LF DI (GLOVE) ×1 IMPLANT
GLOVE SURG LTX SZ7 (GLOVE) ×2 IMPLANT
GLOVE SURG LTX SZ8 (GLOVE) ×2 IMPLANT
GLOVE SURG UNDER POLY LF SZ7 (GLOVE) ×2 IMPLANT
GLOVE SURG UNDER POLY LF SZ8 (GLOVE) ×2
GOWN STRL REUS W/ TWL LRG LVL3 (GOWN DISPOSABLE) ×2 IMPLANT
GOWN STRL REUS W/ TWL XL LVL3 (GOWN DISPOSABLE) ×1 IMPLANT
GOWN STRL REUS W/TWL LRG LVL3 (GOWN DISPOSABLE) ×4
GOWN STRL REUS W/TWL XL LVL3 (GOWN DISPOSABLE) ×2
IMMOBILIZER KNEE 22 UNIV (SOFTGOODS) ×2 IMPLANT
KIT BASIN OR (CUSTOM PROCEDURE TRAY) ×2 IMPLANT
KIT ROOT REPAIR MEINISCAL PEEK (Anchor) ×2 IMPLANT
KIT TURNOVER KIT B (KITS) ×2 IMPLANT
MANIFOLD NEPTUNE II (INSTRUMENTS) ×4 IMPLANT
MEINISCAL ROOT REPAIR KIT PEEK (Anchor) ×4 IMPLANT
NEEDLE 18GX1X1/2 (RX/OR ONLY) (NEEDLE) IMPLANT
NEEDLE HYPO 25GX1X1/2 BEV (NEEDLE) ×2 IMPLANT
NEEDLE SUT 2-0 SCORPION KNEE (NEEDLE) IMPLANT
NS IRRIG 1000ML POUR BTL (IV SOLUTION) ×2 IMPLANT
PACK ARTHROSCOPY DSU (CUSTOM PROCEDURE TRAY) ×2 IMPLANT
PAD ARMBOARD 7.5X6 YLW CONV (MISCELLANEOUS) ×4 IMPLANT
PAD CAST 4YDX4 CTTN HI CHSV (CAST SUPPLIES) ×1 IMPLANT
PADDING CAST COTTON 4X4 STRL (CAST SUPPLIES) ×2
PADDING CAST COTTON 6X4 STRL (CAST SUPPLIES) ×2 IMPLANT
PENCIL BUTTON HOLSTER BLD 10FT (ELECTRODE) ×2 IMPLANT
PORT APPOLLO RF 90DEGREE MULTI (SURGICAL WAND) ×2 IMPLANT
SOL PREP POV-IOD 4OZ 10% (MISCELLANEOUS) ×2 IMPLANT
SPONGE T-LAP 18X18 ~~LOC~~+RFID (SPONGE) ×2 IMPLANT
SPONGE T-LAP 4X18 ~~LOC~~+RFID (SPONGE) ×2 IMPLANT
SUT ETHILON 3 0 PS 1 (SUTURE) IMPLANT
SUT FIBERWIRE 2-0 18 17.9 3/8 (SUTURE)
SUT MENISCAL KIT (KITS) IMPLANT
SUTURE FIBERWR 2-0 18 17.9 3/8 (SUTURE) IMPLANT
SYR 20ML ECCENTRIC (SYRINGE) ×2 IMPLANT
SYR CONTROL 10ML LL (SYRINGE) IMPLANT
SYR TB 1ML LUER SLIP (SYRINGE) ×2 IMPLANT
TOWEL GREEN STERILE (TOWEL DISPOSABLE) ×2 IMPLANT
TOWEL GREEN STERILE FF (TOWEL DISPOSABLE) ×2 IMPLANT
TUBE CONNECTING 12X1/4 (SUCTIONS) ×2 IMPLANT
TUBING ARTHROSCOPY IRRIG 16FT (MISCELLANEOUS) ×2 IMPLANT
WATER STERILE IRR 1000ML POUR (IV SOLUTION) ×2 IMPLANT

## 2021-03-30 NOTE — Anesthesia Preprocedure Evaluation (Addendum)
Anesthesia Evaluation  Patient identified by MRN, date of birth, ID band Patient awake    Reviewed: Allergy & Precautions, H&P , NPO status , Patient's Chart, lab work & pertinent test results  Airway Mallampati: II  TM Distance: >3 FB Neck ROM: Full    Dental no notable dental hx. (+) Teeth Intact, Dental Advisory Given,  PROMINENT LOWER INCISORS:   Pulmonary asthma ,    Pulmonary exam normal breath sounds clear to auscultation       Cardiovascular Exercise Tolerance: Good hypertension, Pt. on medications Normal cardiovascular exam Rhythm:Regular Rate:Normal     Neuro/Psych negative neurological ROS  negative psych ROS   GI/Hepatic negative GI ROS, Neg liver ROS,   Endo/Other  Hypothyroidism Morbid obesity  Renal/GU negative Renal ROS  negative genitourinary   Musculoskeletal  (+) Arthritis ,   Abdominal   Peds negative pediatric ROS (+)  Hematology negative hematology ROS (+)   Anesthesia Other Findings   Reproductive/Obstetrics negative OB ROS                           Anesthesia Physical Anesthesia Plan  ASA: 2  Anesthesia Plan: General   Post-op Pain Management:    Induction: Intravenous  PONV Risk Score and Plan: 3 and Ondansetron, Treatment may vary due to age or medical condition and Dexamethasone  Airway Management Planned: LMA  Additional Equipment: None  Intra-op Plan:   Post-operative Plan: Extubation in OR  Informed Consent: I have reviewed the patients History and Physical, chart, labs and discussed the procedure including the risks, benefits and alternatives for the proposed anesthesia with the patient or authorized representative who has indicated his/her understanding and acceptance.       Plan Discussed with: CRNA and Anesthesiologist  Anesthesia Plan Comments: ( )      Anesthesia Quick Evaluation

## 2021-03-30 NOTE — Transfer of Care (Signed)
Immediate Anesthesia Transfer of Care Note  Patient: EFTIHIA DOUBET  Procedure(s) Performed: right knee meniscal root tear repair; arthroscopy (Right: Knee)  Patient Location: PACU  Anesthesia Type:General  Level of Consciousness: drowsy and patient cooperative  Airway & Oxygen Therapy: Patient Spontanous Breathing and Patient connected to nasal cannula oxygen  Post-op Assessment: Report given to RN and Post -op Vital signs reviewed and stable  Post vital signs: Reviewed and stable  Last Vitals:  Vitals Value Taken Time  BP 131/78 03/30/21 1410  Temp    Pulse 89 03/30/21 1415  Resp 10 03/30/21 1415  SpO2 95 % 03/30/21 1415  Vitals shown include unvalidated device data.  Last Pain:  Vitals:   03/30/21 1100  TempSrc:   PainSc: 0-No pain         Complications: No notable events documented.

## 2021-03-30 NOTE — Anesthesia Postprocedure Evaluation (Signed)
Anesthesia Post Note  Patient: Kaitlyn Kelly  Procedure(s) Performed: right knee meniscal root tear repair; arthroscopy (Right: Knee)     Patient location during evaluation: PACU Anesthesia Type: General and Regional Level of consciousness: awake Pain management: pain level controlled Vital Signs Assessment: post-procedure vital signs reviewed and stable Respiratory status: spontaneous breathing, nonlabored ventilation, respiratory function stable and patient connected to nasal cannula oxygen Cardiovascular status: blood pressure returned to baseline and stable Postop Assessment: no apparent nausea or vomiting Anesthetic complications: no   No notable events documented.  Last Vitals:  Vitals:   03/30/21 1513 03/30/21 1528  BP: 123/72 119/70  Pulse: 79 73  Resp: 18 12  Temp:  36.4 C  SpO2: 95% 96%    Last Pain:  Vitals:   03/30/21 1513  TempSrc:   PainSc: Asleep                 Skylie Hiott P Opaline Reyburn

## 2021-03-30 NOTE — Brief Op Note (Signed)
   03/30/2021  4:49 PM  PATIENT:  Kaitlyn Kelly  60 y.o. female  PRE-OPERATIVE DIAGNOSIS:  right knee meniscal root tear  POST-OPERATIVE DIAGNOSIS:  right knee meniscal root tear  PROCEDURE:  Procedure(s): right knee meniscal root tear repair; arthroscopy  SURGEON:  Surgeon(s): Meredith Pel, MD  ASSISTANT: magnant pa  ANESTHESIA:   general  EBL: 15 ml    Total I/O In: 1200 [I.V.:1000; IV Piggyback:200] Out: 50 [Blood:50]  BLOOD ADMINISTERED: none  DRAINS: none   LOCAL MEDICATIONS USED:  marcaine mso4 clonidine  SPECIMEN:  No Specimen  COUNTS:  YES  TOURNIQUET:  * Missing tourniquet times found for documented tourniquets in log: [id number] *  DICTATION: .Other Dictation: Dictation Number [id number]  PLAN OF CARE: Discharge to home after PACU  PATIENT DISPOSITION:  PACU - hemodynamically stable

## 2021-03-30 NOTE — Anesthesia Procedure Notes (Signed)
Procedure Name: LMA Insertion Date/Time: 03/30/2021 12:04 PM Performed by: Renato Shin, CRNA Pre-anesthesia Checklist: Patient identified, Emergency Drugs available, Suction available and Patient being monitored Patient Re-evaluated:Patient Re-evaluated prior to induction Oxygen Delivery Method: Circle system utilized Preoxygenation: Pre-oxygenation with 100% oxygen Induction Type: IV induction Ventilation: Mask ventilation without difficulty LMA: LMA inserted LMA Size: 4.0 Number of attempts: 1 Placement Confirmation: positive ETCO2 and breath sounds checked- equal and bilateral Tube secured with: Tape Dental Injury: Teeth and Oropharynx as per pre-operative assessment

## 2021-03-30 NOTE — Anesthesia Procedure Notes (Signed)
Anesthesia Regional Block: Adductor canal block   Pre-Anesthetic Checklist: , timeout performed,  Correct Patient, Correct Site, Correct Laterality,  Correct Procedure, Correct Position, site marked,  Risks and benefits discussed,  Surgical consent,  Pre-op evaluation,  At surgeon's request and post-op pain management  Laterality: Right  Prep: chloraprep       Needles:  Injection technique: Single-shot  Needle Type: Echogenic Stimulator Needle     Needle Length: 5cm  Needle Gauge: 22     Additional Needles:   Procedures:, nerve stimulator,,, ultrasound used (permanent image in chart),,    Narrative:  Start time: 03/30/2021 10:45 AM End time: 03/30/2021 10:48 AM Injection made incrementally with aspirations every 5 mL.  Performed by: Personally  Anesthesiologist: Janeece Riggers, MD  Additional Notes: Functioning IV was confirmed and monitors were applied.  A 70m 22ga Arrow echogenic stimulator needle was used. Sterile prep and drape,hand hygiene and sterile gloves were used. Ultrasound guidance: relevant anatomy identified, needle position confirmed, local anesthetic spread visualized around nerve(s)., vascular puncture avoided.  Image printed for medical record. Negative aspiration and negative test dose prior to incremental administration of local anesthetic. The patient tolerated the procedure well.   NO PIK

## 2021-03-30 NOTE — Assessment & Plan Note (Signed)
Chronic.  BP well controlled today in clinic.  Recent kidney function and electrolytes stable.  Plan to continue lisinopril-hydrochlorothiazide 20-25 daily.  Recheck electrolytes and kidney function in 6 months.  Discussed The 10-year ASCVD risk score (Arnett DK, et al., 2019) is: 3.1%   Values used to calculate the score:     Age: 60 years     Sex: Female     Is Non-Hispanic African American: No     Diabetic: No     Tobacco smoker: No     Systolic Blood Pressure: 123456 mmHg     Is BP treated: Yes     HDL Cholesterol: 48 mg/dL     Total Cholesterol: 178 mg/dL Risk today-no statin indicated at this time.  Try to continue working on dietary and lifestyle changes including low-salt, low saturated fat diet.  Follow-up in 6 months.

## 2021-03-30 NOTE — H&P (Signed)
Kaitlyn Kelly is an 60 y.o. female.   Chief Complaint: right knee pain HPI:  Patient is a 60 year old female with right knee pain.  She has had pain since June after stepping into a hole and feeling a pop in her knee.  She tried a cortisone injection 6 weeks ago.  Takes Aleve as needed for pain.  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  She likes to go to the gym.  [place] and she also is a Neurosurgeon.  Has a history of hypertension and thyroid disease.  Also has a history of low vitamin D which has not been checked recently.  MRI scan shows meniscal root avulsion on the right side with some displacement and extrusion of the meniscus.  Not too much arthritis in the medial compartment yet.  No personal or family history of DVT or pulmonary embolism. Past Medical History:  Diagnosis Date   Anal fissure    Anal fissure    Arthritis    Asthma    Hemorrhoids    History of kidney stones    Hypertension    Hypothyroidism    Pneumonia    Thyroid disease     Past Surgical History:  Procedure Laterality Date   COLONOSCOPY     had a 2014, 2017   TOTAL ABDOMINAL HYSTERECTOMY Left 08/30/2002   TAH/RSO/LO cystectomy for bleeding, bilateral cysts   TUBAL LIGATION     VULVA /PERINEUM BIOPSY  03/08/2020        Family History  Problem Relation Age of Onset   Heart disease Mother    Diabetes Sister    Colon cancer Neg Hx    Esophageal cancer Neg Hx    Pancreatic cancer Neg Hx    Prostate cancer Neg Hx    Rectal cancer Neg Hx    Social History:  reports that she has never smoked. She has never used smokeless tobacco. She reports current alcohol use of about 1.0 - 2.0 standard drink per week. She reports that she does not use drugs.  Allergies:  Allergies  Allergen Reactions   Shingrix [Zoster Vac Recomb Adjuvanted]     LICHEN'S FLARE   Sulfonamide Derivatives Rash    Medications Prior to Admission  Medication Sig Dispense  Refill   albuterol (VENTOLIN HFA) 108 (90 Base) MCG/ACT inhaler INHALE 2 PUFFS INTO THE LUNGS AS NEEDED EVERY 4 HOURS 18 g 1   clobetasol ointment (TEMOVATE) 0.05 % APPLY GENEROUSLY TO VULVA (BOTH SIDES), PERINEUM, AFFECTED AREAS UNTIL 07/18/20: APPLY EVERY OTHER NIGHT AFTER 07/19/20: TWO TIMES WEEKLY. (Patient taking differently: Apply 1 application topically daily as needed (lichen flare).) 60 g 2   docusate sodium (COLACE) 100 MG capsule Take 100 mg by mouth daily.     fluticasone-salmeterol (ADVAIR HFA) 45-21 MCG/ACT inhaler Inhale 2 puffs into the lungs 2 (two) times daily as needed (shortness of breath).     levothyroxine (SYNTHROID) 100 MCG tablet TAKE 1 TABLET BY MOUTH ON AN EMPTY STOMACH IN THE MORNING ONCE A DAY 90 tablet 3   lisinopril-hydrochlorothiazide (ZESTORETIC) 20-25 MG tablet TAKE 1 TABLET BY MOUTH DAILY. 90 tablet 2   aspirin (ASPIRIN CHILDRENS) 81 MG chewable tablet Chew 1 tablet (81 mg total) by mouth daily. 30 tablet 0   meloxicam (MOBIC) 15 MG tablet Take 1 tablet (15 mg total) by mouth daily. 30 tablet 0   methocarbamol (ROBAXIN) 500 MG tablet Take 1 tablet (  500 mg total) by mouth every 8 (eight) hours as needed. 30 tablet 0   ondansetron (ZOFRAN) 4 MG tablet Take 1 tablet (4 mg total) by mouth every 8 (eight) hours as needed for nausea or vomiting. 20 tablet 0   oxyCODONE (ROXICODONE) 5 MG immediate release tablet Take 1 tablet (5 mg total) by mouth every 4 (four) hours as needed. 25 tablet 0    No results found for this or any previous visit (from the past 48 hour(s)). No results found.  Review of Systems  Musculoskeletal:  Positive for arthralgias.  All other systems reviewed and are negative.  Blood pressure 128/80, pulse 98, temperature 97.9 F (36.6 C), temperature source Oral, resp. rate 14, height 5' 5.25" (1.657 m), weight 96.4 kg, SpO2 99 %. Physical Exam Vitals reviewed.  HENT:     Head: Normocephalic.     Nose: Nose normal.     Mouth/Throat:     Mouth:  Mucous membranes are moist.  Eyes:     Pupils: Pupils are equal, round, and reactive to light.  Cardiovascular:     Rate and Rhythm: Normal rate.     Pulses: Normal pulses.  Pulmonary:     Effort: Pulmonary effort is normal.  Abdominal:     General: Abdomen is flat.  Musculoskeletal:     Cervical back: Normal range of motion.  Skin:    General: Skin is warm.     Capillary Refill: Capillary refill takes less than 2 seconds.  Neurological:     General: No focal deficit present.     Mental Status: She is alert.  Psychiatric:        Mood and Affect: Mood normal.   Ortho exam demonstrates excellent range of motion of the right knee with intact ACL PCL and collateral ligaments.  Pedal pulses palpable.  No masses lymphadenopathy or skin changes noted in that right knee region.  She does have posterior medial joint line tenderness plus mild effusion.  McMurray testing is equivocal.  Plain radiographs do not really show much arthritis in the medial compartment nor does the MRI scan  Assessment/Plan Impression is meniscal root avulsion right knee with some extrusion of the meniscus and gapping of about 1 cm.  Actually closer to a large radial tear.  Not too much arthritis in the medial compartment and the rest of the knee looks reasonable.  Discussed operative and nonoperative treatment options for [place] for this problem.  Although it is not a true meniscal root avulsion it is a large radial tear which is meniscal root avulsion equivalent.  I would favor an attempt at meniscal repair by anchoring that meniscal flap into the posterior medial tibia.  Risk and benefits of the procedure are discussed including not limited to infection nerve vessel damage incomplete restoration of meniscal cushioning function as well as potential need for more surgery in the future.  Patient understands the risk and benefits and wishes to proceed.  Extensive nature of the rehabilitative process including limitation of  motion not beyond 90 degrees for the first 4 weeks as well as a period of nonweightbearing for 4 weeks is also discussed.  All questions answered.  No personal or family history of DVT or pulmonary embolism.    Anderson Malta, MD 03/30/2021, 11:32 AM

## 2021-03-31 NOTE — Op Note (Signed)
NAME: Kaitlyn Kelly, [facility] MEDICAL RECORD NO: [id number] ACCOUNT NO: [id number] DATE OF BIRTH: Sep 01, 1960 FACILITY: [facility] LOCATION: [facility] PHYSICIAN: Yetta Barre. Marlou Sa, MD  Operative Report   DATE OF PROCEDURE: 03/30/2021  PREOPERATIVE DIAGNOSIS:  Right knee meniscal root tear.  POSTOPERATIVE DIAGNOSIS:  Right knee meniscal root tear.  PROCEDURE:  Right knee meniscal root repair.  SURGEON:  Attending, Meredith Pel, MD  ASSISTANT:  Annie Main.  INDICATIONS:  The patient is a 60 year old patient with right knee pain.  Has meniscal root tear with meniscal extrusion and gapping of about a centimeter on MRI scan.  Presents for operative management after explanation of risks and benefits.  DESCRIPTION OF PROCEDURE:  The patient was brought to the operating room where general anesthetic was induced.  Preoperative antibiotics were administered.  Timeout was called.  Right leg examined under anesthesia and found to have full extension, full  flexion, good stability to varus and valgus stress with intact ACL and PCL.  Right leg was prescrubbed with alcohol and Betadine, allowed to air dry.  Prepped with DuraPrep solution and draped in a sterile manner.  Charlie Pitter was then used to cover the  operative field.  Anterior inferolateral and anterior inferomedial portals were established.  Diagnostic arthroscopy was performed.  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  Patellofemoral joint also had early grade I to II chondromalacia changes on the undersurface of the patella and trochlea.  Next, the medial meniscus was torn at its root with about 1 cm of medial extrusion.  There  was also tearing of the meniscus in a horizontal cleavage manner.  This was debrided.  The meniscus itself was not particularly mobile back towards the lateral direction towards the root.  At this time, the patient's  articular surface was inspected.   Medial tibial articular surface intact.  Medial femoral condyle had grade III and small grade IV changes, that were 20% of the weightbearing surface area.  At this time, the decision was made to perform the meniscal root repair to restore at least part  of that meniscal function to delay her need for total knee replacement.  Using 2 FiberLink sutures and the Knee Scorpion, the sutures were passed into that meniscus.  Then, using a guide, the FlipCutter was used to create the tunnel, which was about 7-8  mm in depth. With care being taken to avoid injury to the posterior neurovascular structures, the tunnel was completed and sutures were passed using a shuttle stitch from the tibial plateau out to the anterior portion of the tibia.  Next, the knee was  taken through range of motion and we were able to reduce the meniscus into the tunnel very nicely with good anchoring point obtained.  Then, with the knee in about 20 degrees of flexion and maximum tension applied, the sutures were secured into a  SwiveLock.  This gave very nice reduction of that meniscal root tear.  Thorough irrigation was performed.  Portals were closed using 3-0 nylon.  The other incision was closed using 2-0 Vicryl and 3-0 nylon.  A solution of Marcaine, morphine, clonidine  injected into the knee for postoperative pain relief.  Impervious dressing was placed along with knee immobilizer.  Luke's assistance was required at all times for retraction, opening, closing, mobilization of tissues.  His assistance was a medical  necessity.   SHW D: 03/30/2021 4:58:27 pm T:  03/30/2021 11:29:00 pm  JOB: [id number]

## 2021-04-02 ENCOUNTER — Encounter (HOSPITAL_COMMUNITY): Payer: Self-pay | Admitting: Orthopedic Surgery

## 2021-04-05 ENCOUNTER — Encounter: Payer: Self-pay | Admitting: Nurse Practitioner

## 2021-04-07 ENCOUNTER — Other Ambulatory Visit: Payer: Self-pay

## 2021-04-07 ENCOUNTER — Encounter: Payer: Self-pay | Admitting: Orthopedic Surgery

## 2021-04-07 ENCOUNTER — Ambulatory Visit (INDEPENDENT_AMBULATORY_CARE_PROVIDER_SITE_OTHER): Payer: No Typology Code available for payment source | Admitting: Orthopedic Surgery

## 2021-04-07 DIAGNOSIS — S83241D Other tear of medial meniscus, current injury, right knee, subsequent encounter: Secondary | ICD-10-CM

## 2021-04-07 NOTE — Progress Notes (Signed)
Post-Op Visit Note   Patient: Kaitlyn Kelly           Date of Birth: [birth year]           MRN: [id number] Visit Date: 04/07/2021 PCP: Eulogio Bear, NP   Assessment & Plan:  Chief Complaint:  Chief Complaint  Patient presents with   Right Knee - Routine Post Op     03/30/21 (8d) Meredith Pel, MD  right knee meniscal root tear repair; arthroscopy - Right     Visit Diagnoses:  1. Acute medial meniscus tear of right knee, subsequent encounter     Plan: Patient is a 60 year old female who presents s/p right knee meniscal root repair on 03/30/2021.  She reports she is doing well and only complains of some soreness around her anterior knee.  She has maintained her nonweightbearing status.  She is sleeping in her recliner as she finds it difficult to sleep in bed due to the soreness in her knee.  She is taking aspirin every day for DVT prophylaxis as well as Mobic and Robaxin for pain control.  She only has to use oxycodone sparingly.  She is kept her leg straight for the last week with no knee flexion.  Incisions seem to be healing well and sutures removed and replaced with Steri-Strips today.  She is able to perform multiple straight leg raises without extensor lag.  No calf tenderness.  Negative Homans' sign.  Plan is to keep nonweightbearing on the operative extremity.  She is okay to bend from 0 degrees to 90 degrees but no flexion past 90 degrees.  Continue with aspirin for DVT prophylaxis.  Keep working on her quad strength.  She is lacking about 3 degrees of full extension on exam today so she will focus on achieving full extension and I demonstrated how to do this for her in the office today.  Follow-up in 4 weeks for clinical recheck and initiation of weightbearing at that time.  May discontinue her knee immobilizer when she is sedentary and she can stop using it when she is ambulatory if she can do 15 straight leg raises.  Follow-Up Instructions: No follow-ups on  file.   Orders:  No orders of the defined types were placed in this encounter.  No orders of the defined types were placed in this encounter.   Imaging: No results found.  PMFS History: Patient Active Problem List   Diagnosis Date Noted   Vulvar irritation 73/53/2992   Lichen sclerosus 42/68/3419   Pain in both hands 01/17/2020   Vitamin D deficiency 05/20/2009   ALLERGIC RHINITIS 05/20/2009   ELEVATED BLOOD PRESSURE WITHOUT DIAGNOSIS OF HYPERTENSION 05/20/2009   HASHIMOTO'S THYROIDITIS 12/07/2007   OBESITY, UNSPECIFIED 06/11/2007   Essential hypertension 06/11/2007   Asthma 06/11/2007   HEMATURIA, HX OF 06/11/2007   Past Medical History:  Diagnosis Date   Anal fissure    Anal fissure    Arthritis    Asthma    Hemorrhoids    History of kidney stones    Hypertension    Hypothyroidism    Pneumonia    Thyroid disease     Family History  Problem Relation Age of Onset   Heart disease Mother    Diabetes Sister    Colon cancer Neg Hx    Esophageal cancer Neg Hx    Pancreatic cancer Neg Hx    Prostate cancer Neg Hx    Rectal cancer Neg Hx     Past Surgical  History:  Procedure Laterality Date   COLONOSCOPY     had a 2014, 2017   KNEE ARTHROSCOPY WITH MENISCAL REPAIR Right 03/30/2021   Procedure: right knee meniscal root tear repair; arthroscopy;  Surgeon: Meredith Pel, MD;  Location: [facility];  Service: Orthopedics;  Laterality: Right;   TOTAL ABDOMINAL HYSTERECTOMY Left 08/30/2002   TAH/RSO/LO cystectomy for bleeding, bilateral cysts   TUBAL LIGATION     VULVA /PERINEUM BIOPSY  03/08/2020       Social History   Occupational History   Not on file  Tobacco Use   Smoking status: Never   Smokeless tobacco: Never  Vaping Use   Vaping Use: Never used  Substance and Sexual Activity   Alcohol use: Yes    Alcohol/week: 1.0 - 2.0 standard drink    Types: 1 - 2 Glasses of wine per week   Drug use: No   Sexual activity: Yes

## 2021-04-10 DIAGNOSIS — S83241A Other tear of medial meniscus, current injury, right knee, initial encounter: Secondary | ICD-10-CM

## 2021-04-28 ENCOUNTER — Encounter: Payer: Self-pay | Admitting: Nurse Practitioner

## 2021-04-30 ENCOUNTER — Other Ambulatory Visit: Payer: Self-pay

## 2021-04-30 ENCOUNTER — Encounter: Payer: Self-pay | Admitting: Nurse Practitioner

## 2021-04-30 ENCOUNTER — Ambulatory Visit (INDEPENDENT_AMBULATORY_CARE_PROVIDER_SITE_OTHER): Payer: No Typology Code available for payment source | Admitting: *Deleted

## 2021-04-30 ENCOUNTER — Other Ambulatory Visit: Payer: Self-pay | Admitting: Nurse Practitioner

## 2021-04-30 ENCOUNTER — Other Ambulatory Visit: Payer: Self-pay | Admitting: Obstetrics and Gynecology

## 2021-04-30 ENCOUNTER — Other Ambulatory Visit (HOSPITAL_COMMUNITY): Payer: Self-pay

## 2021-04-30 ENCOUNTER — Ambulatory Visit: Payer: No Typology Code available for payment source | Admitting: Nurse Practitioner

## 2021-04-30 DIAGNOSIS — E063 Autoimmune thyroiditis: Secondary | ICD-10-CM

## 2021-04-30 DIAGNOSIS — Z23 Encounter for immunization: Secondary | ICD-10-CM | POA: Diagnosis not present

## 2021-04-30 DIAGNOSIS — I10 Essential (primary) hypertension: Secondary | ICD-10-CM

## 2021-04-30 MED ORDER — LEVOTHYROXINE SODIUM 100 MCG PO TABS
100.0000 ug | ORAL_TABLET | Freq: Every day | ORAL | 3 refills | Status: DC
  Filled 2021-04-30: qty 90, 90d supply, fill #0
  Filled 2021-08-18: qty 90, 90d supply, fill #1

## 2021-04-30 MED ORDER — LISINOPRIL-HYDROCHLOROTHIAZIDE 20-25 MG PO TABS
1.0000 | ORAL_TABLET | Freq: Every day | ORAL | 2 refills | Status: DC
Start: 2021-04-30 — End: 2021-04-30
  Filled 2021-04-30: qty 90, 90d supply, fill #0

## 2021-04-30 MED ORDER — LISINOPRIL-HYDROCHLOROTHIAZIDE 20-25 MG PO TABS
1.0000 | ORAL_TABLET | Freq: Every day | ORAL | 3 refills | Status: DC
Start: 2021-04-30 — End: 2021-08-20
  Filled 2021-04-30 – 2021-08-18 (×2): qty 90, 90d supply, fill #0

## 2021-05-03 ENCOUNTER — Other Ambulatory Visit: Payer: Self-pay | Admitting: Obstetrics and Gynecology

## 2021-05-03 ENCOUNTER — Other Ambulatory Visit (HOSPITAL_COMMUNITY): Payer: Self-pay

## 2021-05-05 ENCOUNTER — Other Ambulatory Visit: Payer: Self-pay

## 2021-05-05 ENCOUNTER — Ambulatory Visit (INDEPENDENT_AMBULATORY_CARE_PROVIDER_SITE_OTHER): Payer: No Typology Code available for payment source | Admitting: Orthopedic Surgery

## 2021-05-05 DIAGNOSIS — S83241D Other tear of medial meniscus, current injury, right knee, subsequent encounter: Secondary | ICD-10-CM

## 2021-05-06 ENCOUNTER — Other Ambulatory Visit (HOSPITAL_COMMUNITY): Payer: Self-pay

## 2021-05-09 ENCOUNTER — Encounter: Payer: Self-pay | Admitting: Orthopedic Surgery

## 2021-05-09 NOTE — Progress Notes (Signed)
Post-Op Visit Note   Patient: Kaitlyn Kelly           Date of Birth: Jul 03, 1961           MRN: [id number] Visit Date: 05/05/2021 PCP: Eulogio Bear, NP   Assessment & Plan:  Chief Complaint:  Chief Complaint  Patient presents with   Right Knee - Routine Post Op    03/30/21 (5w 1d) Meredith Pel, MD right knee meniscal root tear repair; arthroscopy     Visit Diagnoses:  1. Acute medial meniscus tear of right knee, subsequent encounter     Plan: Patient presents now 6 weeks out meniscal root repair performed 03/30/2021.  She is on Mobic.  Pain is improved.  She has been full weightbearing.  Uses a cane around the house.  On exam she has trace effusion and no joint line tenderness.  No calf tenderness negative Homans.  At this time I think it is okay for her to bend past 90 degrees but no loading of the knee past 90 degrees.  Okay for stationary bike.  6-week return.  Okay to return to work 05/17/2021.  Follow-Up Instructions: Return in about 6 weeks (around 06/16/2021).   Orders:  No orders of the defined types were placed in this encounter.  No orders of the defined types were placed in this encounter.   Imaging: No results found.  PMFS History: Patient Active Problem List   Diagnosis Date Noted   Acute medial meniscus tear of right knee    Vulvar irritation 85/63/1497   Lichen sclerosus 02/63/7858   Pain in both hands 01/17/2020   Vitamin D deficiency 05/20/2009   ALLERGIC RHINITIS 05/20/2009   ELEVATED BLOOD PRESSURE WITHOUT DIAGNOSIS OF HYPERTENSION 05/20/2009   HASHIMOTO'S THYROIDITIS 12/07/2007   OBESITY, UNSPECIFIED 06/11/2007   Essential hypertension 06/11/2007   Asthma 06/11/2007   HEMATURIA, HX OF 06/11/2007   Past Medical History:  Diagnosis Date   Anal fissure    Anal fissure    Arthritis    Asthma    Hemorrhoids    History of kidney stones    Hypertension    Hypothyroidism    Pneumonia    Thyroid disease     Family History   Problem Relation Age of Onset   Heart disease Mother    Diabetes Sister    Colon cancer Neg Hx    Esophageal cancer Neg Hx    Pancreatic cancer Neg Hx    Prostate cancer Neg Hx    Rectal cancer Neg Hx     Past Surgical History:  Procedure Laterality Date   COLONOSCOPY     had a 2014, 2017   KNEE ARTHROSCOPY WITH MENISCAL REPAIR Right 03/30/2021   Procedure: right knee meniscal root tear repair; arthroscopy;  Surgeon: Meredith Pel, MD;  Location: [facility];  Service: Orthopedics;  Laterality: Right;   TOTAL ABDOMINAL HYSTERECTOMY Left 08/30/2002   TAH/RSO/LO cystectomy for bleeding, bilateral cysts   TUBAL LIGATION     VULVA /PERINEUM BIOPSY  03/08/2020       Social History   Occupational History   Not on file  Tobacco Use   Smoking status: Never   Smokeless tobacco: Never  Vaping Use   Vaping Use: Never used  Substance and Sexual Activity   Alcohol use: Yes    Alcohol/week: 1.0 - 2.0 standard drink    Types: 1 - 2 Glasses of wine per week   Drug use: No   Sexual activity:  Yes

## 2021-05-11 ENCOUNTER — Other Ambulatory Visit (HOSPITAL_COMMUNITY): Payer: Self-pay

## 2021-05-12 ENCOUNTER — Other Ambulatory Visit (HOSPITAL_COMMUNITY): Payer: Self-pay

## 2021-05-14 ENCOUNTER — Other Ambulatory Visit (HOSPITAL_COMMUNITY): Payer: Self-pay

## 2021-05-18 ENCOUNTER — Other Ambulatory Visit (HOSPITAL_COMMUNITY): Payer: Self-pay

## 2021-05-19 ENCOUNTER — Other Ambulatory Visit (HOSPITAL_COMMUNITY): Payer: Self-pay

## 2021-05-20 ENCOUNTER — Encounter: Payer: Self-pay | Admitting: Nurse Practitioner

## 2021-05-27 ENCOUNTER — Other Ambulatory Visit (HOSPITAL_COMMUNITY): Payer: Self-pay

## 2021-05-28 ENCOUNTER — Other Ambulatory Visit (HOSPITAL_COMMUNITY): Payer: Self-pay

## 2021-05-29 ENCOUNTER — Other Ambulatory Visit (HOSPITAL_COMMUNITY): Payer: Self-pay

## 2021-05-31 ENCOUNTER — Other Ambulatory Visit (HOSPITAL_COMMUNITY): Payer: Self-pay

## 2021-06-07 ENCOUNTER — Other Ambulatory Visit (HOSPITAL_COMMUNITY): Payer: Self-pay

## 2021-06-08 ENCOUNTER — Other Ambulatory Visit (HOSPITAL_COMMUNITY): Payer: Self-pay

## 2021-06-09 ENCOUNTER — Other Ambulatory Visit (HOSPITAL_COMMUNITY): Payer: Self-pay

## 2021-06-11 ENCOUNTER — Other Ambulatory Visit (HOSPITAL_COMMUNITY): Payer: Self-pay

## 2021-06-15 ENCOUNTER — Encounter: Payer: Self-pay | Admitting: Nurse Practitioner

## 2021-06-17 ENCOUNTER — Other Ambulatory Visit (HOSPITAL_COMMUNITY): Payer: Self-pay

## 2021-06-18 ENCOUNTER — Ambulatory Visit: Payer: No Typology Code available for payment source | Admitting: Orthopedic Surgery

## 2021-06-18 ENCOUNTER — Other Ambulatory Visit (HOSPITAL_COMMUNITY): Payer: Self-pay

## 2021-06-21 ENCOUNTER — Other Ambulatory Visit: Payer: No Typology Code available for payment source

## 2021-06-21 ENCOUNTER — Other Ambulatory Visit: Payer: Self-pay

## 2021-06-21 DIAGNOSIS — R790 Abnormal level of blood mineral: Secondary | ICD-10-CM

## 2021-06-21 LAB — CBC WITH DIFFERENTIAL/PLATELET
Absolute Monocytes: 482 cells/uL (ref 200–950)
Basophils Absolute: 52 cells/uL (ref 0–200)
Basophils Relative: 0.6 %
Eosinophils Absolute: 155 cells/uL (ref 15–500)
Eosinophils Relative: 1.8 %
HCT: 40.6 % (ref 35.0–45.0)
Hemoglobin: 13.4 g/dL (ref 11.7–15.5)
Lymphs Abs: 2606 cells/uL (ref 850–3900)
MCH: 28.1 pg (ref 27.0–33.0)
MCHC: 33 g/dL (ref 32.0–36.0)
MCV: 85.1 fL (ref 80.0–100.0)
MPV: 10.9 fL (ref 7.5–12.5)
Monocytes Relative: 5.6 %
Neutro Abs: 5306 cells/uL (ref 1500–7800)
Neutrophils Relative %: 61.7 %
Platelets: 321 10*3/uL (ref 140–400)
RBC: 4.77 10*6/uL (ref 3.80–5.10)
RDW: 13 % (ref 11.0–15.0)
Total Lymphocyte: 30.3 %
WBC: 8.6 10*3/uL (ref 3.8–10.8)

## 2021-06-21 LAB — IRON,TIBC AND FERRITIN PANEL
%SAT: 10 % (calc) — ABNORMAL LOW (ref 16–45)
Ferritin: 7 ng/mL — ABNORMAL LOW (ref 16–232)
Iron: 47 ug/dL (ref 45–160)
TIBC: 450 mcg/dL (calc) (ref 250–450)

## 2021-06-23 DIAGNOSIS — S335XXA Sprain of ligaments of lumbar spine, initial encounter: Secondary | ICD-10-CM | POA: Insufficient documentation

## 2021-06-23 DIAGNOSIS — K219 Gastro-esophageal reflux disease without esophagitis: Secondary | ICD-10-CM | POA: Insufficient documentation

## 2021-06-23 DIAGNOSIS — M2550 Pain in unspecified joint: Secondary | ICD-10-CM | POA: Insufficient documentation

## 2021-06-23 DIAGNOSIS — Z780 Asymptomatic menopausal state: Secondary | ICD-10-CM | POA: Insufficient documentation

## 2021-06-23 DIAGNOSIS — M1990 Unspecified osteoarthritis, unspecified site: Secondary | ICD-10-CM | POA: Insufficient documentation

## 2021-06-23 DIAGNOSIS — E039 Hypothyroidism, unspecified: Secondary | ICD-10-CM | POA: Insufficient documentation

## 2021-06-23 DIAGNOSIS — L940 Localized scleroderma [morphea]: Secondary | ICD-10-CM | POA: Insufficient documentation

## 2021-06-25 ENCOUNTER — Encounter: Payer: Self-pay | Admitting: Nurse Practitioner

## 2021-06-25 ENCOUNTER — Ambulatory Visit (INDEPENDENT_AMBULATORY_CARE_PROVIDER_SITE_OTHER): Payer: No Typology Code available for payment source | Admitting: Nurse Practitioner

## 2021-06-25 ENCOUNTER — Other Ambulatory Visit: Payer: Self-pay

## 2021-06-25 VITALS — BP 126/82 | HR 91 | Temp 98.4°F | Resp 18 | Ht 65.0 in | Wt 213.0 lb

## 2021-06-25 DIAGNOSIS — R790 Abnormal level of blood mineral: Secondary | ICD-10-CM

## 2021-06-25 NOTE — Progress Notes (Signed)
Subjective:    Patient ID: Kaitlyn Kelly, female    DOB: [birth year], 60 y.o.   MRN: [id number]  HPI: Kaitlyn Kelly is a 60 y.o. female presenting for lab work follow up.  Chief Complaint  Patient presents with   Office visit   Patient reports she is recovering well from knee surgery.  She is back at work and doing well.  Has noticed increased shortness of breath with exertion that she did not used to experience, however thinks this is deconditioning and is working on this.   She denies any active bleeding, easy bruising, pica cravings.  She does have a history of heavy periods which did make her anemic.  She did not tolerate iron well in the past.  She had a hysterectomy and is not having any vaginal bleeding.  She would like to donate blood again and is wondering if this is safe for her to do.  Allergies  Allergen Reactions   Diclofenac Sodium Other (See Comments)   Shingrix [Zoster Vac Recomb Adjuvanted]     LICHEN'S FLARE   Sulfonamide Derivatives Rash    Outpatient Encounter Medications as of 06/25/2021  Medication Sig   albuterol (VENTOLIN HFA) 108 (90 Base) MCG/ACT inhaler INHALE 2 PUFFS INTO THE LUNGS AS NEEDED EVERY 4 HOURS   aspirin 81 MG chewable tablet    docusate sodium (COLACE) 100 MG capsule Take 100 mg by mouth daily.   fluticasone-salmeterol (ADVAIR) 100-50 MCG/ACT AEPB 1 puff   levothyroxine (SYNTHROID) 100 MCG tablet Take 1 tablet (100 mcg total) by mouth daily before breakfast.   lisinopril-hydrochlorothiazide (ZESTORETIC) 20-25 MG tablet TAKE 1 TABLET BY MOUTH DAILY.   [DISCONTINUED] meloxicam (MOBIC) 15 MG tablet Take 1 tablet (15 mg total) by mouth daily.   No facility-administered encounter medications on file as of 06/25/2021.    Patient Active Problem List   Diagnosis Date Noted   Acquired hypothyroidism 06/23/2021   Arthralgia of multiple joints 06/23/2021   Gastroesophageal reflux disease 06/23/2021   Localized morphea 06/23/2021   Lumbar  back sprain 06/23/2021   Menopause 06/23/2021   Osteoarthritis 06/23/2021   Acute medial meniscus tear of right knee    Vulvar irritation 51/88/4166   Lichen sclerosus 01/15/1600   Pain in both hands 01/17/2020   Vitamin D deficiency 05/20/2009   Allergic rhinitis 05/20/2009   ELEVATED BLOOD PRESSURE WITHOUT DIAGNOSIS OF HYPERTENSION 05/20/2009   HASHIMOTO'S THYROIDITIS 12/07/2007   Obesity 06/11/2007   Essential hypertension 06/11/2007   Exacerbation of asthma 06/11/2007   HEMATURIA, HX OF 06/11/2007    Past Medical History:  Diagnosis Date   Anal fissure    Anal fissure    Arthritis    Asthma    Hemorrhoids    History of kidney stones    Hypertension    Hypothyroidism    Pneumonia    Thyroid disease     Relevant past medical, surgical, family and social history reviewed and updated as indicated. Interim medical history since our last visit reviewed.  Review of Systems Per HPI unless specifically indicated above     Objective:    BP 126/82 (BP Location: Left Arm, Patient Position: Sitting, Cuff Size: Large)   Pulse 91   Temp 98.4 F (36.9 C) (Temporal)   Resp 18   Ht 5\' 5"  (1.651 m)   Wt 213 lb (96.6 kg)   SpO2 97%   BMI 35.45 kg/m   Wt Readings from Last 3 Encounters:  06/25/21 213 lb (96.6  kg)  03/30/21 212 lb 8.4 oz (96.4 kg)  03/26/21 212 lb 9.6 oz (96.4 kg)    Physical Exam Vitals and nursing note reviewed.  Constitutional:      General: She is not in acute distress.    Appearance: Normal appearance. She is not toxic-appearing.  Eyes:     General: No scleral icterus.    Extraocular Movements: Extraocular movements intact.     Conjunctiva/sclera: Conjunctivae normal.  Cardiovascular:     Rate and Rhythm: Normal rate and regular rhythm.     Heart sounds: Normal heart sounds. No murmur heard. Pulmonary:     Effort: Pulmonary effort is normal. No respiratory distress.     Breath sounds: Normal breath sounds. No wheezing, rhonchi or rales.   Skin:    General: Skin is warm and dry.     Coloration: Skin is not jaundiced or pale.     Findings: No erythema.  Neurological:     Mental Status: She is alert and oriented to person, place, and time.     Motor: No weakness.     Gait: Gait abnormal (walks with slight limp).  Psychiatric:        Mood and Affect: Mood normal.        Behavior: Behavior normal.        Thought Content: Thought content normal.        Judgment: Judgment normal.    Results for orders placed or performed in visit on 06/21/21  Iron, TIBC and Ferritin Panel  Result Value Ref Range   Iron 47 45 - 160 mcg/dL   TIBC 450 250 - 450 mcg/dL (calc)   %SAT 10 (L) 16 - 45 % (calc)   Ferritin 7 (L) 16 - 232 ng/mL  CBC with Differential/Platelet  Result Value Ref Range   WBC 8.6 3.8 - 10.8 Thousand/uL   RBC 4.77 3.80 - 5.10 Million/uL   Hemoglobin 13.4 11.7 - 15.5 g/dL   HCT 40.6 35.0 - 45.0 %   MCV 85.1 80.0 - 100.0 fL   MCH 28.1 27.0 - 33.0 pg   MCHC 33.0 32.0 - 36.0 g/dL   RDW 13.0 11.0 - 15.0 %   Platelets 321 140 - 400 Thousand/uL   MPV 10.9 7.5 - 12.5 fL   Neutro Abs 5,306 1,500 - 7,800 cells/uL   Lymphs Abs 2,606 850 - 3,900 cells/uL   Absolute Monocytes 482 200 - 950 cells/uL   Eosinophils Absolute 155 15 - 500 cells/uL   Basophils Absolute 52 0 - 200 cells/uL   Neutrophils Relative % 61.7 %   Total Lymphocyte 30.3 %   Monocytes Relative 5.6 %   Eosinophils Relative 1.8 %   Basophils Relative 0.6 %      Assessment & Plan:  1. Low ferritin Acute.  There is not a clear cause of the low ferritin.  However, she is not anemic.  She does not have any signs of symptoms of anemia.  She is up to date on her cancer screenings.  Blood count is completely normal as above.  I do not think we need to work this up any further, we can continue to check blood counts about every 6 months to make sure they do not drop.  In the meantime, she is going to work on increasing the iron intake in her diet.  Her hemoglobin  is normal, therefore I believe it is safe for her to donate blood.  Patient is comfortable with this plan.  Follow up plan: Return in about 3 months (around 09/23/2021) for thyroid recheck.

## 2021-07-08 ENCOUNTER — Other Ambulatory Visit: Payer: Self-pay

## 2021-07-08 ENCOUNTER — Encounter: Payer: Self-pay | Admitting: Orthopedic Surgery

## 2021-07-08 ENCOUNTER — Ambulatory Visit (INDEPENDENT_AMBULATORY_CARE_PROVIDER_SITE_OTHER): Payer: No Typology Code available for payment source | Admitting: Surgical

## 2021-07-08 DIAGNOSIS — S83241D Other tear of medial meniscus, current injury, right knee, subsequent encounter: Secondary | ICD-10-CM

## 2021-07-08 NOTE — Progress Notes (Signed)
Post-Op Visit Note   Patient: Kaitlyn Kelly           Date of Birth: [birth year]           MRN: [id number] Visit Date: 07/08/2021 PCP: Eulogio Bear, NP   Assessment & Plan:  Chief Complaint:  Chief Complaint  Patient presents with   Right Knee - Follow-up    03/30/21 (60m 9d) right knee meniscal root tear repair; arthroscopy      Visit Diagnoses:  1. Acute medial meniscus tear of right knee, subsequent encounter     Plan: Patient is a 60 year old female who presents s/p right knee meniscal root repair 03/30/2021.  She states that she is progressively improving.  Her her right knee is doing well even after returning to work in late October.  She is able to a send and descend stairs though halfway up the stairs her knee starts to give her some trouble so she stopped taking them sequentially.  Pain is worse with weather changes and cold weather.  She use a cane initially when returning to work but she does not have to use this any longer.  She and her husband have both noticed that she is walking increasingly normal without antalgia and she feels that she has not plateaued or hit a wall yet.  Right knee has 0 degrees extension and 115 degrees of knee flexion.  Left knee with 0 degrees extension and 125 degrees of knee flexion.  No effusion noted on exam today.  No joint line tenderness.  Mild calf tenderness bilaterally.  Patient has been taking aspirin daily.  Denies any chest pain or shortness of breath.  Excellent quadricep and hamstring strength rated 5/5.  Incisions are well-healed from prior surgery.  Plan is to have patient return in 3 months for clinical recheck and final release at that point.  Expect that with her continual improvement she will be doing much better.  Cautioned her against any loaded knee flexion past 90 degrees.  Follow-up in 3 months.  Follow-Up Instructions: No follow-ups on file.   Orders:  No orders of the defined types were placed in this  encounter.  No orders of the defined types were placed in this encounter.   Imaging: No results found.  PMFS History: Patient Active Problem List   Diagnosis Date Noted   Acquired hypothyroidism 06/23/2021   Arthralgia of multiple joints 06/23/2021   Gastroesophageal reflux disease 06/23/2021   Localized morphea 06/23/2021   Lumbar back sprain 06/23/2021   Menopause 06/23/2021   Osteoarthritis 06/23/2021   Acute medial meniscus tear of right knee    Vulvar irritation 97/08/6376   Lichen sclerosus 58/85/0277   Pain in both hands 01/17/2020   Vitamin D deficiency 05/20/2009   Allergic rhinitis 05/20/2009   ELEVATED BLOOD PRESSURE WITHOUT DIAGNOSIS OF HYPERTENSION 05/20/2009   HASHIMOTO'S THYROIDITIS 12/07/2007   Obesity 06/11/2007   Essential hypertension 06/11/2007   Exacerbation of asthma 06/11/2007   HEMATURIA, HX OF 06/11/2007   Past Medical History:  Diagnosis Date   Anal fissure    Anal fissure    Arthritis    Asthma    Hemorrhoids    History of kidney stones    Hypertension    Hypothyroidism    Pneumonia    Thyroid disease     Family History  Problem Relation Age of Onset   Heart disease Mother    Diabetes Sister    Colon cancer Neg Hx    Esophageal cancer  Neg Hx    Pancreatic cancer Neg Hx    Prostate cancer Neg Hx    Rectal cancer Neg Hx     Past Surgical History:  Procedure Laterality Date   COLONOSCOPY     had a 2014, 2017   KNEE ARTHROSCOPY WITH MENISCAL REPAIR Right 03/30/2021   Procedure: right knee meniscal root tear repair; arthroscopy;  Surgeon: Meredith Pel, MD;  Location: [facility];  Service: Orthopedics;  Laterality: Right;   TOTAL ABDOMINAL HYSTERECTOMY Left 08/30/2002   TAH/RSO/LO cystectomy for bleeding, bilateral cysts   TUBAL LIGATION     VULVA /PERINEUM BIOPSY  03/08/2020       Social History   Occupational History   Not on file  Tobacco Use   Smoking status: Never   Smokeless tobacco: Never  Vaping Use   Vaping Use:  Never used  Substance and Sexual Activity   Alcohol use: Yes    Alcohol/week: 1.0 - 2.0 standard drink    Types: 1 - 2 Glasses of wine per week   Drug use: No   Sexual activity: Yes

## 2021-08-16 ENCOUNTER — Ambulatory Visit: Payer: No Typology Code available for payment source | Admitting: Physician Assistant

## 2021-08-16 ENCOUNTER — Ambulatory Visit: Payer: Self-pay

## 2021-08-16 ENCOUNTER — Other Ambulatory Visit: Payer: Self-pay

## 2021-08-16 ENCOUNTER — Encounter: Payer: Self-pay | Admitting: Registered Nurse

## 2021-08-16 ENCOUNTER — Encounter: Payer: Self-pay | Admitting: Physician Assistant

## 2021-08-16 DIAGNOSIS — M25562 Pain in left knee: Secondary | ICD-10-CM | POA: Diagnosis not present

## 2021-08-16 NOTE — Telephone Encounter (Signed)
Patient has been scheduled to see [name] 08/16/2021

## 2021-08-16 NOTE — Progress Notes (Signed)
Office Visit Note   Patient: Kaitlyn Kelly           Date of Birth: 06-Aug-1960           MRN: [id number] Visit Date: 08/16/2021              Requested by: Eulogio Bear, NP [address],  [place] [address] PCP: Eulogio Bear, NP  Chief Complaint  Patient presents with   Left Knee - Pain      HPI: Patient is a pleasant 61 year old woman who presents with a chief complaint of severe posterior knee pain.  She said she was watching the football game last night and got up and heard a loud pop which was also heard by her husband.  She had extreme pain which she said was worse than when she had a kidney stone.  The pain is focused in the posterior aspect of her knee.  She denies any frank instability but has not been able to bear weight because of the pain.  She did not sleep last night because of the pain.  She denies any calf pain though she does say that the pain from the back or knee goes down into the calf she is status post right knee meniscal repair by Dr. Marlou Sa a few months ago.  Assessment & Plan: Visit Diagnoses:  1. Acute pain of left knee     Plan: Acute onset of left knee pain and inability to bear weight.  She does not have an active effusion today she does not have warmth or sign of an infective process.  Given that this is acute and significant and painful and she has mechanical symptoms of unable to be comfortable bending or extending her leg I have ordered an emergent MRI we will go over this with recommendations.  In the meantime I encouraged her to continue to move her knee she can refrain from weightbearing if it hurts discussed case with Dr. Marlou Sa  Follow-Up Instructions: No follow-ups on file.   Ortho Exam  Patient is alert, oriented, no adenopathy, well-dressed, normal affect, normal respiratory effort. Examination of her left knee compared to her right knee she really does not have any but a minimal amount of swelling I could not palpate an  effusion today no ecchymosis.  She is focally tender along the lateral patella border.  Also a lot of tenderness in the popliteal fossa.  She has good flexion and extension of her ankle though this is painful in the back of her knee.  She can come to full extension again quite painful she flexes to about 95 degrees and then cannot do so because she said her knee hurts too much.  She is guarded with varus and valgus testing as well as anterior draw but these seem to be intact compartments of the lower leg are soft and nontender she has a negative Homans' sign does not reproduce pain in the belly of the calf when passively flexing her ankle distal foot is warm  Imaging: XR KNEE 3 VIEW LEFT  Result Date: 08/16/2021 Radiographs of her left knee were reviewed today with Dr. Marlou Sa.  She does have medial joint space narrowing and sclerotic changes.  Also patellofemoral space narrowing.  No sign of subluxation of the patella no acute osseous changes she does have some osteopenia  No images are attached to the encounter.  Labs: No results found for: HGBA1C, ESRSEDRATE, CRP, LABURIC, REPTSTATUS, GRAMSTAIN, CULT, LABORGA  No results found for: ALBUMIN, PREALBUMIN, CBC  No results found for: MG Lab Results  Component Value Date   VD25OH 37 02/25/2021    No results found for: PREALBUMIN CBC EXTENDED Latest Ref Rng & Units 06/21/2021 03/17/2021 06/15/2020  WBC 3.8 - 10.8 Thousand/uL 8.6 6.8 7.1  RBC 3.80 - 5.10 Million/uL 4.77 4.90 5.21(H)  HGB 11.7 - 15.5 g/dL 13.4 13.9 14.8  HCT 35.0 - 45.0 % 40.6 42.4 44.0  PLT 140 - 400 Thousand/uL 321 297 271  NEUTROABS 1,500 - 7,800 cells/uL 5,306 - 4,146  LYMPHSABS 850 - 3,900 cells/uL 2,606 - 2,293     There is no height or weight on file to calculate BMI.  Orders:  Orders Placed This Encounter  Procedures   XR KNEE 3 VIEW LEFT   MR Knee Left w/o contrast   No orders of the defined types were placed in this encounter.    Procedures: No procedures  performed  Clinical Data: No additional findings.  ROS:  All other systems negative, except as noted in the HPI. Review of Systems  Objective: Vital Signs: There were no vitals taken for this visit.  Specialty Comments:  No specialty comments available.  PMFS History: Patient Active Problem List   Diagnosis Date Noted   Acquired hypothyroidism 06/23/2021   Arthralgia of multiple joints 06/23/2021   Gastroesophageal reflux disease 06/23/2021   Localized morphea 06/23/2021   Lumbar back sprain 06/23/2021   Menopause 06/23/2021   Osteoarthritis 06/23/2021   Acute medial meniscus tear of right knee    Vulvar irritation 32/99/2426   Lichen sclerosus 83/41/9622   Pain in both hands 01/17/2020   Vitamin D deficiency 05/20/2009   Allergic rhinitis 05/20/2009   ELEVATED BLOOD PRESSURE WITHOUT DIAGNOSIS OF HYPERTENSION 05/20/2009   HASHIMOTO'S THYROIDITIS 12/07/2007   Obesity 06/11/2007   Essential hypertension 06/11/2007   Exacerbation of asthma 06/11/2007   HEMATURIA, HX OF 06/11/2007   Past Medical History:  Diagnosis Date   Anal fissure    Anal fissure    Arthritis    Asthma    Hemorrhoids    History of kidney stones    Hypertension    Hypothyroidism    Pneumonia    Thyroid disease     Family History  Problem Relation Age of Onset   Heart disease Mother    Diabetes Sister    Colon cancer Neg Hx    Esophageal cancer Neg Hx    Pancreatic cancer Neg Hx    Prostate cancer Neg Hx    Rectal cancer Neg Hx     Past Surgical History:  Procedure Laterality Date   COLONOSCOPY     had a 2014, 2017   KNEE ARTHROSCOPY WITH MENISCAL REPAIR Right 03/30/2021   Procedure: right knee meniscal root tear repair; arthroscopy;  Surgeon: Meredith Pel, MD;  Location: [facility];  Service: Orthopedics;  Laterality: Right;   TOTAL ABDOMINAL HYSTERECTOMY Left 08/30/2002   TAH/RSO/LO cystectomy for bleeding, bilateral cysts   TUBAL LIGATION     VULVA /PERINEUM BIOPSY  03/08/2020        Social History   Occupational History   Not on file  Tobacco Use   Smoking status: Never   Smokeless tobacco: Never  Vaping Use   Vaping Use: Never used  Substance and Sexual Activity   Alcohol use: Yes    Alcohol/week: 1.0 - 2.0 standard drink    Types: 1 - 2 Glasses of wine per week   Drug use: No  Sexual activity: Yes

## 2021-08-17 ENCOUNTER — Ambulatory Visit
Admission: RE | Admit: 2021-08-17 | Discharge: 2021-08-17 | Disposition: A | Discharge status: Home / Self Care | Payer: No Typology Code available for payment source | Source: Ambulatory Visit | Attending: Physician Assistant | Admitting: Physician Assistant

## 2021-08-17 DIAGNOSIS — M25562 Pain in left knee: Secondary | ICD-10-CM

## 2021-08-18 ENCOUNTER — Other Ambulatory Visit (HOSPITAL_COMMUNITY): Payer: Self-pay

## 2021-08-19 ENCOUNTER — Ambulatory Visit (INDEPENDENT_AMBULATORY_CARE_PROVIDER_SITE_OTHER): Payer: No Typology Code available for payment source | Admitting: Orthopedic Surgery

## 2021-08-19 ENCOUNTER — Other Ambulatory Visit: Payer: Self-pay

## 2021-08-19 DIAGNOSIS — M25562 Pain in left knee: Secondary | ICD-10-CM | POA: Diagnosis not present

## 2021-08-20 ENCOUNTER — Other Ambulatory Visit: Payer: Self-pay

## 2021-08-20 ENCOUNTER — Other Ambulatory Visit (HOSPITAL_COMMUNITY): Payer: Self-pay

## 2021-08-20 ENCOUNTER — Ambulatory Visit (INDEPENDENT_AMBULATORY_CARE_PROVIDER_SITE_OTHER): Payer: No Typology Code available for payment source | Admitting: Registered Nurse

## 2021-08-20 ENCOUNTER — Encounter: Payer: Self-pay | Admitting: Registered Nurse

## 2021-08-20 VITALS — BP 129/69 | HR 89 | Temp 98.2°F | Resp 18 | Ht 65.0 in | Wt 213.0 lb

## 2021-08-20 DIAGNOSIS — J45909 Unspecified asthma, uncomplicated: Secondary | ICD-10-CM

## 2021-08-20 DIAGNOSIS — E063 Autoimmune thyroiditis: Secondary | ICD-10-CM | POA: Diagnosis not present

## 2021-08-20 DIAGNOSIS — I10 Essential (primary) hypertension: Secondary | ICD-10-CM | POA: Diagnosis not present

## 2021-08-20 MED ORDER — ALBUTEROL SULFATE HFA 108 (90 BASE) MCG/ACT IN AERS
INHALATION_SPRAY | RESPIRATORY_TRACT | 1 refills | Status: DC
  Filled 2021-08-20: qty 18, 16d supply, fill #0
  Filled 2022-04-01: qty 6.7, 16d supply, fill #1
  Filled 2022-04-01: qty 18, 17d supply, fill #0
  Filled 2022-04-01: qty 18, 16d supply, fill #0

## 2021-08-20 MED ORDER — LISINOPRIL-HYDROCHLOROTHIAZIDE 20-25 MG PO TABS
1.0000 | ORAL_TABLET | Freq: Every day | ORAL | 3 refills | Status: DC
  Filled 2021-08-20 – 2021-12-14 (×2): qty 90, 90d supply, fill #0
  Filled 2022-04-01: qty 90, 90d supply, fill #1
  Filled 2022-07-05 – 2022-07-06 (×2): qty 90, 90d supply, fill #2

## 2021-08-20 MED ORDER — LEVOTHYROXINE SODIUM 100 MCG PO TABS
100.0000 ug | ORAL_TABLET | Freq: Every day | ORAL | 3 refills | Status: DC
  Filled 2021-08-20 – 2021-12-14 (×2): qty 90, 90d supply, fill #0
  Filled 2022-04-01: qty 90, 90d supply, fill #1
  Filled 2022-07-05 – 2022-07-06 (×2): qty 90, 90d supply, fill #2

## 2021-08-20 NOTE — Patient Instructions (Addendum)
Ms. Uber -   Doristine Devoid to meet you  Refilled meds x 6 mo, see you then to recheck labs and do a physical   Call sooner if you need anything  Thanks,  Rich     If you have lab work done today you will be contacted with your lab results within the next 2 weeks.  If you have not heard from [place] then please contact us. The fastest way to get your results is to register for My Chart.   IF you received an x-ray today, you will receive an invoice from [facility]. Please contact [facility] at [phone] with questions or concerns regarding your invoice.   IF you received labwork today, you will receive an invoice from [facility]. Please contact [facility] at [phone] with questions or concerns regarding your invoice.   Our billing staff will not be able to assist you with questions regarding bills from these companies.  You will be contacted with the lab results as soon as they are available. The fastest way to get your results is to activate your My Chart account. Instructions are located on the last page of this paperwork. If you have not heard from [place] regarding the results in 2 weeks, please contact this office.

## 2021-08-20 NOTE — Progress Notes (Signed)
New Patient Office Visit  Subjective:  Patient ID: Kaitlyn Kelly, female    DOB: 05/23/1961  Age: 61 y.o. MRN: [id number]  CC:  Chief Complaint  Patient presents with   New Patient (Initial Visit)    Patient is here to establish care. Patient has no other concerns.    HPI Kaitlyn Kelly presents for visit to est care  Histories reviewed and updated with patient.   Notes upcoming L TKA at the end of this month. Long hx of OA. Will have with Dr. Marlou Sa. He repaired her R meniscus    Past Medical History:  Diagnosis Date   Anal fissure    Arthritis    Asthma    Hemorrhoids    History of kidney stones    Hypertension    Hypothyroidism    Pneumonia    Thyroid disease     Past Surgical History:  Procedure Laterality Date   COLONOSCOPY     had a 2014, 2017   KNEE ARTHROSCOPY WITH MENISCAL REPAIR Right 03/30/2021   Procedure: right knee meniscal root tear repair; arthroscopy;  Surgeon: Meredith Pel, MD;  Location: [place] ;  Service: Orthopedics;  Laterality: Right;   TOTAL ABDOMINAL HYSTERECTOMY Left 08/30/2002   TAH/RSO/LO cystectomy for bleeding, bilateral cysts   TUBAL LIGATION     VULVA /PERINEUM BIOPSY  03/08/2020        Family History  Problem Relation Age of Onset   Heart disease Mother    Alcohol abuse Mother    Asthma Mother    Depression Mother    Drug abuse Mother    Early death Mother    Hypertension Mother    Thyroid disease Sister    Diabetes Sister    Asthma Sister    Drug abuse Sister    Hypertension Sister    Psoriasis Sister    Interstitial cystitis Sister    Colon cancer Neg Hx    Esophageal cancer Neg Hx    Pancreatic cancer Neg Hx    Prostate cancer Neg Hx    Rectal cancer Neg Hx     Social History   Socioeconomic History   Marital status: Married    Spouse name: Not on file   Number of children: Not on file   Years of education: Not on file   Highest education level: Not on file  Occupational History   Not on file   Tobacco Use   Smoking status: Never   Smokeless tobacco: Never  Vaping Use   Vaping Use: Never used  Substance and Sexual Activity   Alcohol use: Yes    Alcohol/week: 1.0 - 2.0 standard drink    Types: 1 - 2 Glasses of wine per week    Comment: socially   Drug use: No   Sexual activity: Yes  Other Topics Concern   Not on file  Social History Narrative   Not on file   Social Determinants of Health   Financial Resource Strain: Not on file  Food Insecurity: Not on file  Transportation Needs: Not on file  Physical Activity: Not on file  Stress: Not on file  Social Connections: Not on file  Intimate Partner Violence: Not on file    ROS Review of Systems  Constitutional: Negative.   HENT: Negative.    Eyes: Negative.   Respiratory: Negative.    Cardiovascular: Negative.   Gastrointestinal: Negative.   Genitourinary: Negative.   Musculoskeletal: Negative.   Skin: Negative.   Neurological: Negative.  Psychiatric/Behavioral: Negative.    All other systems reviewed and are negative.  Objective:   Today's Vitals: BP 129/69    Pulse 89    Temp 98.2 F (36.8 C) (Temporal)    Resp 18    Ht 5\' 5"  (1.651 m)    Wt 213 lb (96.6 kg)    SpO2 100%    BMI 35.45 kg/m   Physical Exam Vitals and nursing note reviewed.  Constitutional:      General: She is not in acute distress.    Appearance: Normal appearance. She is not ill-appearing, toxic-appearing or diaphoretic.  Cardiovascular:     Rate and Rhythm: Normal rate and regular rhythm.     Pulses: Normal pulses.     Heart sounds: Normal heart sounds. No murmur heard.   No friction rub. No gallop.  Pulmonary:     Effort: Pulmonary effort is normal. No respiratory distress.     Breath sounds: Normal breath sounds. No stridor. No wheezing, rhonchi or rales.  Chest:     Chest wall: No tenderness.  Skin:    General: Skin is warm and dry.     Capillary Refill: Capillary refill takes less than 2 seconds.  Neurological:      General: No focal deficit present.     Mental Status: She is alert and oriented to person, place, and time. Mental status is at baseline.  Psychiatric:        Mood and Affect: Mood normal.        Behavior: Behavior normal.        Thought Content: Thought content normal.        Judgment: Judgment normal.    Assessment & Plan:   Problem List Items Addressed This Visit       Cardiovascular and Mediastinum   Essential hypertension   Relevant Medications   lisinopril-hydrochlorothiazide (ZESTORETIC) 20-25 MG tablet   levothyroxine (SYNTHROID) 100 MCG tablet     Endocrine   HASHIMOTO'S THYROIDITIS   Relevant Medications   lisinopril-hydrochlorothiazide (ZESTORETIC) 20-25 MG tablet   levothyroxine (SYNTHROID) 100 MCG tablet   Other Visit Diagnoses     Mild asthma without complication, unspecified whether persistent    -  Primary   Relevant Medications   albuterol (VENTOLIN HFA) 108 (90 Base) MCG/ACT inhaler       Outpatient Encounter Medications as of 08/20/2021  Medication Sig   aspirin 81 MG chewable tablet    docusate sodium (COLACE) 100 MG capsule Take 100 mg by mouth daily.   fluticasone-salmeterol (ADVAIR) 100-50 MCG/ACT AEPB 1 puff   [DISCONTINUED] albuterol (VENTOLIN HFA) 108 (90 Base) MCG/ACT inhaler INHALE 2 PUFFS INTO THE LUNGS AS NEEDED EVERY 4 HOURS   [DISCONTINUED] levothyroxine (SYNTHROID) 100 MCG tablet Take 1 tablet (100 mcg total) by mouth daily before breakfast.   [DISCONTINUED] lisinopril-hydrochlorothiazide (ZESTORETIC) 20-25 MG tablet TAKE 1 TABLET BY MOUTH DAILY.   albuterol (VENTOLIN HFA) 108 (90 Base) MCG/ACT inhaler INHALE 2 PUFFS INTO THE LUNGS AS NEEDED EVERY 4 HOURS   levothyroxine (SYNTHROID) 100 MCG tablet Take 1 tablet (100 mcg total) by mouth daily before breakfast.   lisinopril-hydrochlorothiazide (ZESTORETIC) 20-25 MG tablet TAKE 1 TABLET BY MOUTH DAILY.   No facility-administered encounter medications on file as of 08/20/2021.    Follow-up:  Return in about 6 months (around 02/17/2022) for CPE and labs.   PLAN Refill meds x 6 mo CPE and labs at next visit She will have labs at preop with dr. Marlou Sa Patient encouraged to call  clinic with any questions, comments, or concerns.  Maximiano Coss, NP

## 2021-08-21 ENCOUNTER — Encounter: Payer: Self-pay | Admitting: Orthopedic Surgery

## 2021-08-21 NOTE — Progress Notes (Signed)
Office Visit Note   Patient: Kaitlyn Kelly           Date of Birth: April 01, 1961           MRN: [id number] Visit Date: 08/19/2021 Requested by: Eulogio Bear, NP [place] [address],  [place] [address] PCP: Maximiano Coss, NP  Subjective: Chief Complaint  Patient presents with   Other     Scan review    HPI: Patient presents for evaluation of left knee pain.  She reports global pain in the left knee.  She stood up and felt a pop in the knee.  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  She has had right knee meniscal surgery and is doing well from that.  MRI scan of the left knee is reviewed.  Does show degenerative changes and complex tear of the body and posterior horn of the medial meniscus with extrusion of the meniscus into the medial gutter.  Meniscal root looks intact.  There is also full-thickness patellofemoral chondral cartilage loss with subchondral cystic changes and advanced articular cartilage thinning in the medial compartment.  This study is reviewed with the patient.  Patient is most interested in having only 1 surgery to address her left knee pain.  The left knee pain is keeping her up at night and interfering with activities of daily living.              ROS: All systems reviewed are negative as they relate to the chief complaint within the history of present illness.  Patient denies  fevers or chills.   Assessment & Plan: Visit Diagnoses:  1. Acute pain of left knee     Plan: Impression is left knee medial meniscal tear with extrusion with medial compartment arthritis and patellofemoral compartment arthritis.  Patient has global knee pain.  Arthroscopic debridement particularly in light of the medial compartment arthritis that is present as well as the meniscal extrusion is present would be very unpredictable in terms of pain relief.  Patient also has severe patellofemoral arthritis.  We discussed operative and nonoperative treatment  options.  Operative treatment options would be arthroscopy and debridement which may or may not help the majority of her symptoms.  Total knee replacement is also an option.  Patient had to stay off of her right knee for 6 4 to 5 weeks due to the meniscal repair.  I think in the left knee her weightbearing would be immediate.  The risk and benefits of total knee replacement are discussed with the patient including not limited to infection nerve vessel damage knee stiffness as well as potential need for revision in her lifetime.  Would plan to use press-fit prosthesis in this patient to minimize the chance of requiring a revision.  Patient understands risk and benefits.  No personal or family history of DVT or pulmonary embolism.  All questions answered  Follow-Up Instructions: No follow-ups on file.   Orders:  No orders of the defined types were placed in this encounter.  No orders of the defined types were placed in this encounter.     Procedures: No procedures performed   Clinical Data: No additional findings.  Objective: Vital Signs: There were no vitals taken for this visit.  Physical Exam:   Constitutional: Patient appears well-developed HEENT:  Head: Normocephalic Eyes:EOM are normal Neck: Normal range of motion Cardiovascular: Normal rate Pulmonary/chest: Effort normal Neurologic: Patient is alert Skin: Skin is warm Psychiatric:  Patient has normal mood and affect   Ortho Exam: Ortho exam demonstrates full active and passive range of motion of the right knee.  Left knee has mild effusion with medial and lateral joint line tenderness.  Lacks about 5 degrees of full extension.  Flexion is to 1 15-1 20.  Collaterals are stable to varus and valgus stress at 0 30 and 90 degrees with no groin pain with internal/external rotation of the left leg.  Specialty Comments:  No specialty comments available.  Imaging: No results found.   PMFS History: Patient Active Problem List    Diagnosis Date Noted   Acquired hypothyroidism 06/23/2021   Arthralgia of multiple joints 06/23/2021   Gastroesophageal reflux disease 06/23/2021   Localized morphea 06/23/2021   Lumbar back sprain 06/23/2021   Menopause 06/23/2021   Osteoarthritis 06/23/2021   Acute medial meniscus tear of right knee    Vulvar irritation 09/32/3557   Lichen sclerosus 32/20/2542   Pain in both hands 01/17/2020   Vitamin D deficiency 05/20/2009   Allergic rhinitis 05/20/2009   ELEVATED BLOOD PRESSURE WITHOUT DIAGNOSIS OF HYPERTENSION 05/20/2009   HASHIMOTO'S THYROIDITIS 12/07/2007   Obesity 06/11/2007   Essential hypertension 06/11/2007   Exacerbation of asthma 06/11/2007   HEMATURIA, HX OF 06/11/2007   Past Medical History:  Diagnosis Date   Anal fissure    Arthritis    Asthma    Hemorrhoids    History of kidney stones    Hypertension    Hypothyroidism    Pneumonia    Thyroid disease     Family History  Problem Relation Age of Onset   Heart disease Mother    Alcohol abuse Mother    Asthma Mother    Depression Mother    Drug abuse Mother    Early death Mother    Hypertension Mother    Thyroid disease Sister    Diabetes Sister    Asthma Sister    Drug abuse Sister    Hypertension Sister    Psoriasis Sister    Interstitial cystitis Sister    Colon cancer Neg Hx    Esophageal cancer Neg Hx    Pancreatic cancer Neg Hx    Prostate cancer Neg Hx    Rectal cancer Neg Hx     Past Surgical History:  Procedure Laterality Date   COLONOSCOPY     had a 2014, 2017   KNEE ARTHROSCOPY WITH MENISCAL REPAIR Right 03/30/2021   Procedure: right knee meniscal root tear repair; arthroscopy;  Surgeon: Meredith Pel, MD;  Location: [facility];  Service: Orthopedics;  Laterality: Right;   TOTAL ABDOMINAL HYSTERECTOMY Left 08/30/2002   TAH/RSO/LO cystectomy for bleeding, bilateral cysts   TUBAL LIGATION     VULVA /PERINEUM BIOPSY  03/08/2020       Social History   Occupational History    Not on file  Tobacco Use   Smoking status: Never   Smokeless tobacco: Never  Vaping Use   Vaping Use: Never used  Substance and Sexual Activity   Alcohol use: Yes    Alcohol/week: 1.0 - 2.0 standard drink    Types: 1 - 2 Glasses of wine per week    Comment: socially   Drug use: No   Sexual activity: Yes

## 2021-09-06 ENCOUNTER — Other Ambulatory Visit: Payer: Self-pay | Admitting: Surgical

## 2021-09-06 ENCOUNTER — Other Ambulatory Visit (HOSPITAL_COMMUNITY): Payer: Self-pay

## 2021-09-06 MED ORDER — OXYCODONE-ACETAMINOPHEN 5-325 MG PO TABS
1.0000 | ORAL_TABLET | ORAL | 0 refills | Status: DC | PRN
  Filled 2021-09-06: qty 30, 5d supply, fill #0

## 2021-09-06 MED ORDER — GABAPENTIN 300 MG PO CAPS
300.0000 mg | ORAL_CAPSULE | Freq: Three times a day (TID) | ORAL | 0 refills | Status: DC
  Filled 2021-09-06: qty 30, 10d supply, fill #0

## 2021-09-06 MED ORDER — METHOCARBAMOL 500 MG PO TABS
500.0000 mg | ORAL_TABLET | Freq: Three times a day (TID) | ORAL | 0 refills | Status: DC | PRN
  Filled 2021-09-06: qty 30, 10d supply, fill #0

## 2021-09-06 MED ORDER — IBUPROFEN 800 MG PO TABS
800.0000 mg | ORAL_TABLET | Freq: Three times a day (TID) | ORAL | 0 refills | Status: DC | PRN
  Filled 2021-09-06: qty 60, 20d supply, fill #0

## 2021-09-07 ENCOUNTER — Telehealth: Payer: Self-pay

## 2021-09-07 NOTE — Telephone Encounter (Signed)
Prior Kaitlyn Kelly is pending with [facility] for pts scheduled left TKA. The nurse is requesting to know what conservative treatment tried and failed pt has had , to include when and how long with results.I don't see anything in the notes. Please advise.

## 2021-09-08 ENCOUNTER — Telehealth: Payer: Self-pay | Admitting: Orthopedic Surgery

## 2021-09-08 ENCOUNTER — Encounter: Payer: Self-pay | Admitting: Orthopedic Surgery

## 2021-09-08 NOTE — Telephone Encounter (Signed)
I called Kaitlyn Kelly today.  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  This is not a knee that will respond to arthroscopic debridement of the incidental tear of the meniscus.  We discussed the utility of arthroscopy in this situation versus knee replacement.  Arthroscopic debridement of the meniscus in this clinical situation and in the absence of mechanical symptoms but only pain will only exacerbate her existing arthritis.  Knee arthroscopy is not indicated in this patient who has severe medial compartment and patellofemoral compartment arthritis.  Patient has tried over-the-counter anti-inflammatories activity modification and home exercises for least 6 weeks with no improvement in symptoms.  The patient's BMI is 35.44.  The patient describes daily knee pain which is keeping her up at night almost all nights as well as pain which interferes with her ADLs.

## 2021-09-09 ENCOUNTER — Encounter: Payer: Self-pay | Admitting: Orthopedic Surgery

## 2021-09-09 ENCOUNTER — Other Ambulatory Visit: Payer: Self-pay

## 2021-09-09 NOTE — Progress Notes (Signed)
Surgical Instructions    Your procedure is scheduled on Tuesday, February 28th.  Report to [facility] Main Entrance "A" at 9:00 A.M., then check in with the Admitting office.  Call this number if you have problems the morning of surgery:  [phone]   If you have any questions prior to your surgery date call [phone]: Open Monday-Friday 8am-4pm    Remember:  Do not eat after midnight the night before your surgery  You may drink clear liquids until 8:00 AM the morning of your surgery.   Clear liquids allowed are: Water, Non-Citrus Juices (without pulp), Carbonated Beverages, Clear Tea, Black Coffee ONLY (NO MILK, CREAM OR POWDERED CREAMER of any kind), and Gatorade  Please complete your PRE-SURGERY ENSURE that was provided to you by 8:00 AM the morning of surgery.  Please, if able, drink it in one sitting. DO NOT SIP. Nothing else to drink once you finish the Ensure.     Take these medicines the morning of surgery with A SIP OF WATER:   Levothyroxine (Synthroid)  Docusate Sodium (Colace)   If needed:  Albuterol inhaler (bring with you on day of surgery)  Advair inhaler (bring with you on day of surgery)  As of today, STOP taking any Aspirin (unless otherwise instructed by your surgeon) Aleve, Naproxen, Ibuprofen, Motrin, Advil, Goody's, BC's, all herbal medications, fish oil, and all vitamins.           Do not wear jewelry or makeup Do not wear lotions, powders, perfumes, or deodorant. Do not shave 48 hours prior to surgery.   Do not bring valuables to the hospital. Do not wear nail polish, gel polish, artificial nails, or any other type of covering on natural nails (fingers and toes) If you have artificial nails or gel coating that need to be removed by a nail salon, please have this removed prior to surgery. Artificial nails or gel coating may interfere with anesthesia's ability to adequately monitor your vital signs.  [name] is not responsible for any belongings or  valuables. .   Do NOT Smoke (Tobacco/Vaping)  24 hours prior to your procedure  If you use a CPAP at night, you may bring your mask for your overnight stay.   Contacts, glasses, hearing aids, dentures or partials may not be worn into surgery, please bring cases for these belongings   For patients admitted to the hospital, discharge time will be determined by your treatment team.   Patients discharged the day of surgery will not be allowed to drive home, and someone needs to stay with them for 24 hours.  NO VISITORS WILL BE ALLOWED IN PRE-OP WHERE PATIENTS ARE PREPPED FOR SURGERY.  ONLY 1 SUPPORT PERSON MAY BE PRESENT IN THE WAITING ROOM WHILE YOU ARE IN SURGERY.  IF YOU ARE TO BE ADMITTED, ONCE YOU ARE IN YOUR ROOM YOU WILL BE ALLOWED TWO (2) VISITORS. 1 (ONE) VISITOR MAY STAY OVERNIGHT BUT MUST ARRIVE TO THE ROOM BY 8pm.  Minor children may have two parents present. Special consideration for safety and communication needs will be reviewed on a case by case basis.  Special instructions:    Oral Hygiene is also important to reduce your risk of infection.  Remember - BRUSH YOUR TEETH THE MORNING OF SURGERY WITH YOUR REGULAR TOOTHPASTE   [name]- Preparing For Surgery  Before surgery, you can play an important role. Because skin is not sterile, your skin needs to be as free of germs as possible. You can reduce the  number of germs on your skin by washing with CHG (chlorahexidine gluconate) Soap before surgery.  CHG is an antiseptic cleaner which kills germs and bonds with the skin to continue killing germs even after washing.     Please do not use if you have an allergy to CHG or antibacterial soaps. If your skin becomes reddened/irritated stop using the CHG.  Do not shave (including legs and underarms) for at least 48 hours prior to first CHG shower. It is OK to shave your face.  Please follow these instructions carefully.     Shower the NIGHT BEFORE SURGERY and the MORNING OF SURGERY  with CHG Soap.   If you chose to wash your hair, wash your hair first as usual with your normal shampoo. After you shampoo, rinse your hair and body thoroughly to remove the shampoo.  Then [employer] and genitals (private parts) with your normal soap and rinse thoroughly to remove soap.  After that Use CHG Soap as you would any other liquid soap. You can apply CHG directly to the skin and wash gently with a scrungie or a clean washcloth.   Apply the CHG Soap to your body ONLY FROM THE NECK DOWN.  Do not use on open wounds or open sores. Avoid contact with your eyes, ears, mouth and genitals (private parts). Wash Face and genitals (private parts)  with your normal soap.   Wash thoroughly, paying special attention to the area where your surgery will be performed.  Thoroughly rinse your body with warm water from the neck down.  DO NOT shower/wash with your normal soap after using and rinsing off the CHG Soap.  Pat yourself dry with a CLEAN TOWEL.  Wear CLEAN PAJAMAS to bed the night before surgery  Place CLEAN SHEETS on your bed the night before your surgery  DO NOT SLEEP WITH PETS.   Day of Surgery:  Take a shower with CHG soap. Wear Clean/Comfortable clothing the morning of surgery Do not apply any deodorants/lotions.   Remember to brush your teeth WITH YOUR REGULAR TOOTHPASTE.    COVID testing  If you are going to stay overnight or be admitted after your procedure/surgery and require a pre-op COVID test, please follow these instructions after your COVID test   You are not required to quarantine however you are required to wear a well-fitting mask when you are out and around people not in your household.  If your mask becomes wet or soiled, replace with a new one.  Wash your hands often with soap and water for 20 seconds or clean your hands with an alcohol-based hand sanitizer that contains at least 60% alcohol.  Do not share personal items.  Notify your provider: if you are  in close contact with someone who has COVID  or if you develop a fever of 100.4 or greater, sneezing, cough, sore throat, shortness of breath or body aches.    Please read over the following fact sheets that you were given.

## 2021-09-09 NOTE — Telephone Encounter (Signed)
We are good

## 2021-09-10 ENCOUNTER — Encounter (HOSPITAL_COMMUNITY)
Admission: RE | Admit: 2021-09-10 | Discharge: 2021-09-10 | Disposition: A | Discharge status: Home / Self Care | Payer: No Typology Code available for payment source | Source: Ambulatory Visit | Attending: Orthopedic Surgery | Admitting: Orthopedic Surgery

## 2021-09-10 ENCOUNTER — Other Ambulatory Visit: Payer: Self-pay

## 2021-09-10 ENCOUNTER — Encounter (HOSPITAL_COMMUNITY): Payer: Self-pay

## 2021-09-10 VITALS — BP 140/83 | HR 88 | Temp 98.0°F | Resp 18 | Ht 65.25 in | Wt 220.0 lb

## 2021-09-10 DIAGNOSIS — Z01818 Encounter for other preprocedural examination: Secondary | ICD-10-CM | POA: Diagnosis present

## 2021-09-10 DIAGNOSIS — Z20822 Contact with and (suspected) exposure to covid-19: Secondary | ICD-10-CM | POA: Insufficient documentation

## 2021-09-10 HISTORY — DX: Other specified postprocedural states: R11.2

## 2021-09-10 HISTORY — DX: Other specified postprocedural states: Z98.890

## 2021-09-10 HISTORY — DX: Other specified health status: Z78.9

## 2021-09-10 LAB — CBC
HCT: 40 % (ref 36.0–46.0)
Hemoglobin: 12.6 g/dL (ref 12.0–15.0)
MCH: 26.7 pg (ref 26.0–34.0)
MCHC: 31.5 g/dL (ref 30.0–36.0)
MCV: 84.7 fL (ref 80.0–100.0)
Platelets: 329 10*3/uL (ref 150–400)
RBC: 4.72 MIL/uL (ref 3.87–5.11)
RDW: 14.3 % (ref 11.5–15.5)
WBC: 9.7 10*3/uL (ref 4.0–10.5)
nRBC: 0 % (ref 0.0–0.2)

## 2021-09-10 LAB — URINALYSIS, ROUTINE W REFLEX MICROSCOPIC
Bilirubin Urine: NEGATIVE
Glucose, UA: NEGATIVE mg/dL
Hgb urine dipstick: NEGATIVE
Ketones, ur: NEGATIVE mg/dL
Nitrite: NEGATIVE
Protein, ur: NEGATIVE mg/dL
Specific Gravity, Urine: 1.024 (ref 1.005–1.030)
pH: 5 (ref 5.0–8.0)

## 2021-09-10 LAB — BASIC METABOLIC PANEL
Anion gap: 9 (ref 5–15)
BUN: 16 mg/dL (ref 6–20)
CO2: 26 mmol/L (ref 22–32)
Calcium: 8.9 mg/dL (ref 8.9–10.3)
Chloride: 103 mmol/L (ref 98–111)
Creatinine, Ser: 0.89 mg/dL (ref 0.44–1.00)
GFR, Estimated: >60 mL/min (ref >60)
Glucose, Bld: 120 mg/dL — ABNORMAL HIGH (ref 70–99)
Potassium: 3.5 mmol/L (ref 3.5–5.1)
Sodium: 138 mmol/L (ref 135–145)

## 2021-09-10 LAB — SARS CORONAVIRUS 2 (TAT 6-24 HRS): SARS Coronavirus 2: NEGATIVE

## 2021-09-10 LAB — SURGICAL PCR SCREEN
MRSA, PCR: NEGATIVE
Staphylococcus aureus: POSITIVE — AB

## 2021-09-10 NOTE — Progress Notes (Addendum)
PCP - Maximiano Coss NP  Cardiologist - denies  PPM/ICD - Denies Device Orders -  Rep Notified -   Chest x-ray - no EKG - 03/17/21 Stress Test - no ECHO - no Cardiac Cath - no  Sleep Study - no CPAP -   Fasting Blood Sugar - n/a Checks Blood Sugar _____ times a day  Blood Thinner Instructions:n/a Aspirin Instructions: pt states she stopped Aspirin 08/30/21 per Dr. Randel Pigg instructions.   ERAS Protcol -yes,clears until 0800 PRE-SURGERY Ensure or G2- Ensure  COVID TEST- 09/10/21   Anesthesia review: no  Patient denies shortness of breath, fever, cough and chest pain at PAT appointment   All instructions explained to the patient, with a verbal understanding of the material. Patient agrees to go over the instructions while at home for a better understanding. Patient also instructed to wear a maskafter being tested for COVID-19. The opportunity to ask questions was provided.

## 2021-09-10 NOTE — Progress Notes (Signed)
Left voicemail for Debbie Nardi,Dr.Dean's office that patient's surgical PCR was positive for staph and her UA showed many bacteria.

## 2021-09-12 ENCOUNTER — Other Ambulatory Visit: Payer: Self-pay | Admitting: Surgical

## 2021-09-12 LAB — URINE CULTURE: Culture: >=100000 — AB

## 2021-09-12 MED ORDER — NITROFURANTOIN MONOHYD MACRO 100 MG PO CAPS: 100.0000 mg | ORAL_CAPSULE | Freq: Two times a day (BID) | ORAL | 0 refills | Status: DC

## 2021-09-13 ENCOUNTER — Telehealth: Payer: Self-pay

## 2021-09-13 MED ORDER — TRANEXAMIC ACID 1000 MG/10ML IV SOLN
2000.0000 mg | INTRAVENOUS | Status: DC
  Filled 2021-09-13: qty 20

## 2021-09-13 NOTE — Telephone Encounter (Signed)
Message from patient  Good morning, I am surprised about the UTI, I have no issues, I picked up the macrobid and started it, I am npo tomorrow after 8am do i need to take a macrobid in the morning before 8 or skip it

## 2021-09-14 ENCOUNTER — Ambulatory Visit (HOSPITAL_COMMUNITY): Payer: No Typology Code available for payment source | Admitting: Anesthesiology

## 2021-09-14 ENCOUNTER — Encounter (HOSPITAL_COMMUNITY): Payer: Self-pay | Admitting: Orthopedic Surgery

## 2021-09-14 ENCOUNTER — Ambulatory Visit (HOSPITAL_BASED_OUTPATIENT_CLINIC_OR_DEPARTMENT_OTHER): Payer: No Typology Code available for payment source | Admitting: Anesthesiology

## 2021-09-14 ENCOUNTER — Other Ambulatory Visit: Payer: Self-pay

## 2021-09-14 ENCOUNTER — Observation Stay (HOSPITAL_COMMUNITY)
Admission: RE | Admit: 2021-09-14 | Discharge: 2021-09-16 | Disposition: A | Discharge status: Home / Self Care | Payer: No Typology Code available for payment source | Attending: Orthopedic Surgery | Admitting: Orthopedic Surgery

## 2021-09-14 ENCOUNTER — Encounter (HOSPITAL_COMMUNITY)
Admission: RE | Disposition: A | Discharge status: Home / Self Care | Payer: Self-pay | Source: Home / Self Care | Attending: Orthopedic Surgery

## 2021-09-14 ENCOUNTER — Ambulatory Visit (HOSPITAL_COMMUNITY): Payer: No Typology Code available for payment source

## 2021-09-14 DIAGNOSIS — M1712 Unilateral primary osteoarthritis, left knee: Secondary | ICD-10-CM

## 2021-09-14 DIAGNOSIS — J45909 Unspecified asthma, uncomplicated: Secondary | ICD-10-CM | POA: Insufficient documentation

## 2021-09-14 DIAGNOSIS — Z96652 Presence of left artificial knee joint: Secondary | ICD-10-CM

## 2021-09-14 DIAGNOSIS — Z01818 Encounter for other preprocedural examination: Secondary | ICD-10-CM

## 2021-09-14 DIAGNOSIS — I10 Essential (primary) hypertension: Secondary | ICD-10-CM

## 2021-09-14 DIAGNOSIS — E039 Hypothyroidism, unspecified: Secondary | ICD-10-CM | POA: Diagnosis not present

## 2021-09-14 DIAGNOSIS — Z419 Encounter for procedure for purposes other than remedying health state, unspecified: Secondary | ICD-10-CM

## 2021-09-14 HISTORY — PX: TOTAL KNEE ARTHROPLASTY: SHX125

## 2021-09-14 SURGERY — ARTHROPLASTY, KNEE, TOTAL
Anesthesia: Monitor Anesthesia Care | Site: Knee | Laterality: Left

## 2021-09-14 MED ORDER — BUPIVACAINE HCL (PF) 0.25 % IJ SOLN
INTRAMUSCULAR | Status: AC
  Filled 2021-09-14: qty 30

## 2021-09-14 MED ORDER — POVIDONE-IODINE 7.5 % EX SOLN: Freq: Once | CUTANEOUS | Status: DC

## 2021-09-14 MED ORDER — PROPOFOL 10 MG/ML IV BOLUS
INTRAVENOUS | Status: DC | PRN
Start: 2021-09-14 — End: 2021-09-14
  Administered 2021-09-14: 30 mg via INTRAVENOUS

## 2021-09-14 MED ORDER — MIDAZOLAM HCL 2 MG/2ML IJ SOLN: 2.0000 mg | Freq: Once | INTRAMUSCULAR | Status: AC

## 2021-09-14 MED ORDER — CEFAZOLIN SODIUM-DEXTROSE 2-4 GM/100ML-% IV SOLN
INTRAVENOUS | Status: AC
  Administered 2021-09-15: 2 g via INTRAVENOUS
  Filled 2021-09-14: qty 100

## 2021-09-14 MED ORDER — CEFAZOLIN SODIUM-DEXTROSE 2-4 GM/100ML-% IV SOLN: 2.0000 g | Freq: Three times a day (TID) | INTRAVENOUS | Status: DC

## 2021-09-14 MED ORDER — PROPOFOL 500 MG/50ML IV EMUL
INTRAVENOUS | Status: DC | PRN
  Administered 2021-09-14 (×2): 100 ug/kg/min via INTRAVENOUS

## 2021-09-14 MED ORDER — TRANEXAMIC ACID-NACL 1000-0.7 MG/100ML-% IV SOLN
INTRAVENOUS | Status: AC
  Filled 2021-09-14: qty 100

## 2021-09-14 MED ORDER — BUPIVACAINE IN DEXTROSE 0.75-8.25 % IT SOLN
INTRATHECAL | Status: DC | PRN
  Administered 2021-09-14: 1.8 mL via INTRATHECAL

## 2021-09-14 MED ORDER — FENTANYL CITRATE (PF) 100 MCG/2ML IJ SOLN
INTRAMUSCULAR | Status: AC
  Filled 2021-09-14: qty 2

## 2021-09-14 MED ORDER — VANCOMYCIN HCL 1000 MG IV SOLR
INTRAVENOUS | Status: AC
  Filled 2021-09-14: qty 20

## 2021-09-14 MED ORDER — POVIDONE-IODINE 10 % EX SWAB
2.0000 "application " | Freq: Once | CUTANEOUS | Status: AC
  Administered 2021-09-14: 2 via TOPICAL

## 2021-09-14 MED ORDER — PHENOL 1.4 % MT LIQD: 1.0000 | OROMUCOSAL | Status: DC | PRN

## 2021-09-14 MED ORDER — MORPHINE SULFATE (PF) 4 MG/ML IV SOLN: INTRAVENOUS | Status: DC | PRN

## 2021-09-14 MED ORDER — LEVOTHYROXINE SODIUM 100 MCG PO TABS
100.0000 ug | ORAL_TABLET | Freq: Every day | ORAL | Status: DC
  Administered 2021-09-15 – 2021-09-16 (×2): 100 ug via ORAL
  Filled 2021-09-14 (×2): qty 1

## 2021-09-14 MED ORDER — LISINOPRIL-HYDROCHLOROTHIAZIDE 20-25 MG PO TABS: 1.0000 | ORAL_TABLET | Freq: Every day | ORAL | Status: DC

## 2021-09-14 MED ORDER — ASPIRIN 81 MG PO CHEW
81.0000 mg | CHEWABLE_TABLET | Freq: Two times a day (BID) | ORAL | Status: DC
  Administered 2021-09-14 – 2021-09-16 (×4): 81 mg via ORAL
  Filled 2021-09-14 (×4): qty 1

## 2021-09-14 MED ORDER — VANCOMYCIN HCL 1000 MG IV SOLR
INTRAVENOUS | Status: DC | PRN
  Administered 2021-09-14: 1000 mg via TOPICAL

## 2021-09-14 MED ORDER — CHLORHEXIDINE GLUCONATE 0.12 % MT SOLN: 15.0000 mL | Freq: Once | OROMUCOSAL | Status: AC

## 2021-09-14 MED ORDER — HYDROMORPHONE HCL 1 MG/ML IJ SOLN
0.5000 mg | INTRAMUSCULAR | Status: DC | PRN
  Administered 2021-09-14 – 2021-09-16 (×5): 0.5 mg via INTRAVENOUS
  Filled 2021-09-14 (×5): qty 0.5

## 2021-09-14 MED ORDER — LACTATED RINGERS IV SOLN: INTRAVENOUS | Status: DC

## 2021-09-14 MED ORDER — ONDANSETRON HCL 4 MG/2ML IJ SOLN
INTRAMUSCULAR | Status: DC | PRN
  Administered 2021-09-14: 4 mg via INTRAVENOUS

## 2021-09-14 MED ORDER — CEFAZOLIN SODIUM-DEXTROSE 2-4 GM/100ML-% IV SOLN
2.0000 g | INTRAVENOUS | Status: AC
  Administered 2021-09-14: 2 g via INTRAVENOUS

## 2021-09-14 MED ORDER — ACETAMINOPHEN 160 MG/5ML PO SOLN: 325.0000 mg | ORAL | Status: DC | PRN

## 2021-09-14 MED ORDER — ACETAMINOPHEN 325 MG PO TABS: 325.0000 mg | ORAL_TABLET | Freq: Four times a day (QID) | ORAL | Status: DC | PRN

## 2021-09-14 MED ORDER — PROPOFOL 1000 MG/100ML IV EMUL
INTRAVENOUS | Status: AC
  Filled 2021-09-14: qty 100

## 2021-09-14 MED ORDER — HYDROCHLOROTHIAZIDE 25 MG PO TABS
25.0000 mg | ORAL_TABLET | Freq: Every day | ORAL | Status: DC
  Administered 2021-09-15 – 2021-09-16 (×2): 25 mg via ORAL
  Filled 2021-09-14 (×3): qty 1

## 2021-09-14 MED ORDER — ACETAMINOPHEN 325 MG PO TABS: 325.0000 mg | ORAL_TABLET | ORAL | Status: DC | PRN

## 2021-09-14 MED ORDER — SODIUM CHLORIDE 0.9% FLUSH
INTRAVENOUS | Status: DC | PRN
  Administered 2021-09-14: 20 mL

## 2021-09-14 MED ORDER — METOCLOPRAMIDE HCL 5 MG/ML IJ SOLN: 5.0000 mg | Freq: Three times a day (TID) | INTRAMUSCULAR | Status: DC | PRN

## 2021-09-14 MED ORDER — BUPIVACAINE HCL 0.25 % IJ SOLN
INTRAMUSCULAR | Status: DC | PRN
  Administered 2021-09-14: 20 mL

## 2021-09-14 MED ORDER — SODIUM CHLORIDE 0.9 % IR SOLN
Status: DC | PRN
  Administered 2021-09-14: 3000 mL

## 2021-09-14 MED ORDER — ALBUTEROL SULFATE (2.5 MG/3ML) 0.083% IN NEBU: 3.0000 mL | INHALATION_SOLUTION | RESPIRATORY_TRACT | Status: DC | PRN

## 2021-09-14 MED ORDER — FENTANYL CITRATE (PF) 100 MCG/2ML IJ SOLN
INTRAMUSCULAR | Status: AC
  Administered 2021-09-14: 100 ug via INTRAVENOUS
  Filled 2021-09-14: qty 2

## 2021-09-14 MED ORDER — METOCLOPRAMIDE HCL 5 MG PO TABS: 5.0000 mg | ORAL_TABLET | Freq: Three times a day (TID) | ORAL | Status: DC | PRN

## 2021-09-14 MED ORDER — DOCUSATE SODIUM 100 MG PO CAPS
100.0000 mg | ORAL_CAPSULE | Freq: Two times a day (BID) | ORAL | Status: DC
  Administered 2021-09-15 – 2021-09-16 (×3): 100 mg via ORAL
  Filled 2021-09-14 (×4): qty 1

## 2021-09-14 MED ORDER — ONDANSETRON HCL 4 MG/2ML IJ SOLN
4.0000 mg | Freq: Four times a day (QID) | INTRAMUSCULAR | Status: DC | PRN
  Administered 2021-09-14: 4 mg via INTRAVENOUS

## 2021-09-14 MED ORDER — LISINOPRIL 20 MG PO TABS
20.0000 mg | ORAL_TABLET | Freq: Every day | ORAL | Status: DC
Start: 2021-09-14 — End: 2021-09-16
  Administered 2021-09-14 – 2021-09-16 (×3): 20 mg via ORAL
  Filled 2021-09-14 (×3): qty 1

## 2021-09-14 MED ORDER — ACETAMINOPHEN 500 MG PO TABS
1000.0000 mg | ORAL_TABLET | Freq: Four times a day (QID) | ORAL | Status: AC
  Administered 2021-09-14 – 2021-09-15 (×3): 1000 mg via ORAL
  Filled 2021-09-14 (×3): qty 2

## 2021-09-14 MED ORDER — EPHEDRINE SULFATE-NACL 50-0.9 MG/10ML-% IV SOSY
PREFILLED_SYRINGE | INTRAVENOUS | Status: DC | PRN
  Administered 2021-09-14: 5 mg via INTRAVENOUS

## 2021-09-14 MED ORDER — 0.9 % SODIUM CHLORIDE (POUR BTL) OPTIME
TOPICAL | Status: DC | PRN
  Administered 2021-09-14: 1000 mL

## 2021-09-14 MED ORDER — METHOCARBAMOL 1000 MG/10ML IJ SOLN
500.0000 mg | Freq: Four times a day (QID) | INTRAVENOUS | Status: DC | PRN
  Filled 2021-09-14: qty 5

## 2021-09-14 MED ORDER — IRRISEPT - 450ML BOTTLE WITH 0.05% CHG IN STERILE WATER, USP 99.95% OPTIME
TOPICAL | Status: DC | PRN
  Administered 2021-09-14: 450 mL

## 2021-09-14 MED ORDER — ORAL CARE MOUTH RINSE: 15.0000 mL | Freq: Once | OROMUCOSAL | Status: AC

## 2021-09-14 MED ORDER — BUPIVACAINE LIPOSOME 1.3 % IJ SUSP
INTRAMUSCULAR | Status: DC | PRN
Start: 2021-09-14 — End: 2021-09-14
  Administered 2021-09-14: 10 mL

## 2021-09-14 MED ORDER — POVIDONE-IODINE 10 % EX SWAB: 2.0000 "application " | Freq: Once | CUTANEOUS | Status: AC

## 2021-09-14 MED ORDER — FENTANYL CITRATE (PF) 100 MCG/2ML IJ SOLN
25.0000 ug | INTRAMUSCULAR | Status: DC | PRN
  Administered 2021-09-14 (×3): 25 ug via INTRAVENOUS
  Administered 2021-09-14: 50 ug via INTRAVENOUS
  Administered 2021-09-14: 25 ug via INTRAVENOUS

## 2021-09-14 MED ORDER — OXYCODONE HCL 5 MG PO TABS: 5.0000 mg | ORAL_TABLET | Freq: Once | ORAL | Status: DC | PRN

## 2021-09-14 MED ORDER — TRANEXAMIC ACID 1000 MG/10ML IV SOLN
INTRAVENOUS | Status: DC | PRN
  Administered 2021-09-14: 2000 mg via TOPICAL

## 2021-09-14 MED ORDER — MORPHINE SULFATE (PF) 4 MG/ML IV SOLN
INTRAVENOUS | Status: AC
  Filled 2021-09-14: qty 2

## 2021-09-14 MED ORDER — ONDANSETRON HCL 4 MG/2ML IJ SOLN: 4.0000 mg | Freq: Once | INTRAMUSCULAR | Status: DC | PRN

## 2021-09-14 MED ORDER — OXYCODONE HCL 5 MG/5ML PO SOLN: 5.0000 mg | Freq: Once | ORAL | Status: DC | PRN

## 2021-09-14 MED ORDER — FENTANYL CITRATE (PF) 100 MCG/2ML IJ SOLN: 100.0000 ug | Freq: Once | INTRAMUSCULAR | Status: AC

## 2021-09-14 MED ORDER — CEFAZOLIN SODIUM-DEXTROSE 2-4 GM/100ML-% IV SOLN
2.0000 g | Freq: Three times a day (TID) | INTRAVENOUS | Status: DC
  Administered 2021-09-14 – 2021-09-16 (×5): 2 g via INTRAVENOUS
  Filled 2021-09-14 (×5): qty 100

## 2021-09-14 MED ORDER — ONDANSETRON HCL 4 MG PO TABS
4.0000 mg | ORAL_TABLET | Freq: Four times a day (QID) | ORAL | Status: DC | PRN
  Administered 2021-09-16: 4 mg via ORAL
  Filled 2021-09-14: qty 1

## 2021-09-14 MED ORDER — TRANEXAMIC ACID-NACL 1000-0.7 MG/100ML-% IV SOLN
1000.0000 mg | INTRAVENOUS | Status: AC
  Administered 2021-09-14: 1000 mg via INTRAVENOUS

## 2021-09-14 MED ORDER — MENTHOL 3 MG MT LOZG: 1.0000 | LOZENGE | OROMUCOSAL | Status: DC | PRN

## 2021-09-14 MED ORDER — PHENYLEPHRINE 40 MCG/ML (10ML) SYRINGE FOR IV PUSH (FOR BLOOD PRESSURE SUPPORT)
PREFILLED_SYRINGE | INTRAVENOUS | Status: DC | PRN
  Administered 2021-09-14: 80 ug via INTRAVENOUS
  Administered 2021-09-14 (×3): 160 ug via INTRAVENOUS

## 2021-09-14 MED ORDER — METHOCARBAMOL 500 MG PO TABS
500.0000 mg | ORAL_TABLET | Freq: Four times a day (QID) | ORAL | Status: DC | PRN
  Administered 2021-09-14 – 2021-09-15 (×3): 500 mg via ORAL
  Filled 2021-09-14 (×3): qty 1

## 2021-09-14 MED ORDER — CHLORHEXIDINE GLUCONATE 0.12 % MT SOLN
OROMUCOSAL | Status: AC
  Administered 2021-09-14: 15 mL via OROMUCOSAL
  Filled 2021-09-14: qty 15

## 2021-09-14 MED ORDER — MIDAZOLAM HCL 2 MG/2ML IJ SOLN
INTRAMUSCULAR | Status: AC
  Administered 2021-09-14: 2 mg via INTRAVENOUS
  Filled 2021-09-14: qty 2

## 2021-09-14 MED ORDER — MEPERIDINE HCL 25 MG/ML IJ SOLN: 6.2500 mg | INTRAMUSCULAR | Status: DC | PRN

## 2021-09-14 MED ORDER — PHENYLEPHRINE HCL-NACL 20-0.9 MG/250ML-% IV SOLN
INTRAVENOUS | Status: DC | PRN
  Administered 2021-09-14: 20 ug/min via INTRAVENOUS

## 2021-09-14 MED ORDER — OXYCODONE HCL 5 MG PO TABS
5.0000 mg | ORAL_TABLET | ORAL | Status: DC | PRN
  Administered 2021-09-14 – 2021-09-16 (×8): 10 mg via ORAL
  Filled 2021-09-14 (×8): qty 2

## 2021-09-14 SURGICAL SUPPLY — 86 items
BAG COUNTER SPONGE SURGICOUNT (BAG) ×2 IMPLANT
BAG DECANTER FOR FLEXI CONT (MISCELLANEOUS) ×1 IMPLANT
BAG SPNG CNTER NS LX DISP (BAG) ×1
BANDAGE ESMARK 6X9 LF (GAUZE/BANDAGES/DRESSINGS) ×1 IMPLANT
BLADE SAG 18X100X1.27 (BLADE) ×2 IMPLANT
BLADE SAGITTAL (BLADE) ×2
BLADE SAW THK.89X75X18XSGTL (BLADE) ×1 IMPLANT
BNDG CMPR 9X6 STRL LF SNTH (GAUZE/BANDAGES/DRESSINGS) ×1
BNDG CMPR MED 10X6 ELC LF (GAUZE/BANDAGES/DRESSINGS) ×1
BNDG CMPR MED 15X6 ELC VLCR LF (GAUZE/BANDAGES/DRESSINGS) ×1
BNDG COHESIVE 6X5 TAN STRL LF (GAUZE/BANDAGES/DRESSINGS) ×2 IMPLANT
BNDG ELASTIC 6X10 VLCR STRL LF (GAUZE/BANDAGES/DRESSINGS) ×1 IMPLANT
BNDG ELASTIC 6X15 VLCR STRL LF (GAUZE/BANDAGES/DRESSINGS) ×2 IMPLANT
BNDG ESMARK 6X9 LF (GAUZE/BANDAGES/DRESSINGS) ×2
BOWL SMART MIX CTS (DISPOSABLE) IMPLANT
BSPLAT TIB 3 KN TRITANIUM (Knees) ×1 IMPLANT
CNTNR URN SCR LID CUP LEK RST (MISCELLANEOUS) ×1 IMPLANT
COMP FEMORAL CEMNTLESS SZ2 TRI (Joint) ×2 IMPLANT
COMPONENT FEMRL CMNTLS SZ2 TRI (Joint) IMPLANT
CONT SPEC 4OZ STRL OR WHT (MISCELLANEOUS) ×2
COOLER ICEMAN CLASSIC (MISCELLANEOUS) ×1 IMPLANT
COVER SURGICAL LIGHT HANDLE (MISCELLANEOUS) ×2 IMPLANT
CUFF TOURN SGL QUICK 34 (TOURNIQUET CUFF) ×2
CUFF TOURN SGL QUICK 42 (TOURNIQUET CUFF) IMPLANT
CUFF TRNQT CYL 34X4.125X (TOURNIQUET CUFF) ×1 IMPLANT
DECANTER SPIKE VIAL GLASS SM (MISCELLANEOUS) ×2 IMPLANT
DRAPE INCISE IOBAN 66X45 STRL (DRAPES) IMPLANT
DRAPE ORTHO SPLIT 77X108 STRL (DRAPES) ×6
DRAPE SURG ORHT 6 SPLT 77X108 (DRAPES) ×3 IMPLANT
DRAPE U-SHAPE 47X51 STRL (DRAPES) ×2 IMPLANT
DRSG AQUACEL AG ADV 3.5X10 (GAUZE/BANDAGES/DRESSINGS) ×1 IMPLANT
DRSG AQUACEL AG ADV 3.5X14 (GAUZE/BANDAGES/DRESSINGS) ×1 IMPLANT
DURAPREP 26ML APPLICATOR (WOUND CARE) ×4 IMPLANT
ELECT CAUTERY BLADE 6.4 (BLADE) ×1 IMPLANT
ELECT REM PT RETURN 9FT ADLT (ELECTROSURGICAL) ×2
ELECTRODE REM PT RTRN 9FT ADLT (ELECTROSURGICAL) ×1 IMPLANT
GAUZE SPONGE 4X4 12PLY STRL (GAUZE/BANDAGES/DRESSINGS) ×2 IMPLANT
GLOVE SRG 8 PF TXTR STRL LF DI (GLOVE) ×1 IMPLANT
GLOVE SURG ENC MOIS LTX SZ6.5 (GLOVE) ×6 IMPLANT
GLOVE SURG LTX SZ8 (GLOVE) ×2 IMPLANT
GLOVE SURG UNDER POLY LF SZ7 (GLOVE) ×2 IMPLANT
GLOVE SURG UNDER POLY LF SZ8 (GLOVE) ×2
GOWN STRL REUS W/ TWL LRG LVL3 (GOWN DISPOSABLE) ×3 IMPLANT
GOWN STRL REUS W/TWL LRG LVL3 (GOWN DISPOSABLE) ×6
HANDPIECE INTERPULSE COAX TIP (DISPOSABLE) ×2
HOOD PEEL AWAY FLYTE STAYCOOL (MISCELLANEOUS) ×7 IMPLANT
IMMOBILIZER KNEE 20 (SOFTGOODS)
IMMOBILIZER KNEE 20 THIGH 36 (SOFTGOODS) IMPLANT
IMMOBILIZER KNEE 22 UNIV (SOFTGOODS) IMPLANT
IMMOBILIZER KNEE 24 THIGH 36 (MISCELLANEOUS) IMPLANT
IMMOBILIZER KNEE 24 UNIV (MISCELLANEOUS)
INSERT X3 TRIATH SZ3 14 (Insert) ×1 IMPLANT
KIT BASIN OR (CUSTOM PROCEDURE TRAY) ×2 IMPLANT
KIT TURNOVER KIT B (KITS) ×2 IMPLANT
KNEE PATELLA ASYMMETRIC 10X32 (Knees) ×1 IMPLANT
KNEE TIBIAL COMPONENT SZ3 (Knees) ×1 IMPLANT
MANIFOLD NEPTUNE II (INSTRUMENTS) ×2 IMPLANT
NDL SPNL 18GX3.5 QUINCKE PK (NEEDLE) ×1 IMPLANT
NEEDLE 22X1 1/2 (OR ONLY) (NEEDLE) ×4 IMPLANT
NEEDLE SPNL 18GX3.5 QUINCKE PK (NEEDLE) ×2 IMPLANT
NS IRRIG 1000ML POUR BTL (IV SOLUTION) ×4 IMPLANT
PACK TOTAL JOINT (CUSTOM PROCEDURE TRAY) ×2 IMPLANT
PAD ARMBOARD 7.5X6 YLW CONV (MISCELLANEOUS) ×4 IMPLANT
PAD CAST 4YDX4 CTTN HI CHSV (CAST SUPPLIES) ×1 IMPLANT
PAD COLD SHLDR WRAP-ON (PAD) ×1 IMPLANT
PADDING CAST COTTON 4X4 STRL (CAST SUPPLIES) ×2
PADDING CAST COTTON 6X4 STRL (CAST SUPPLIES) ×3 IMPLANT
PIN FLUTED HEDLESS FIX 3.5X1/8 (PIN) ×1 IMPLANT
SET HNDPC FAN SPRY TIP SCT (DISPOSABLE) ×1 IMPLANT
STRIP CLOSURE SKIN 1/2X4 (GAUZE/BANDAGES/DRESSINGS) ×4 IMPLANT
SUCTION FRAZIER HANDLE 10FR (MISCELLANEOUS) ×2
SUCTION TUBE FRAZIER 10FR DISP (MISCELLANEOUS) ×1 IMPLANT
SUT ETHILON 3 0 FSL (SUTURE) ×1 IMPLANT
SUT MNCRL AB 3-0 PS2 18 (SUTURE) ×3 IMPLANT
SUT VIC AB 0 CT1 27 (SUTURE) ×12
SUT VIC AB 0 CT1 27XBRD ANBCTR (SUTURE) ×3 IMPLANT
SUT VIC AB 1 CT1 36 (SUTURE) ×11 IMPLANT
SUT VIC AB 2-0 CT1 27 (SUTURE) ×12
SUT VIC AB 2-0 CT1 TAPERPNT 27 (SUTURE) ×4 IMPLANT
SYR 30ML LL (SYRINGE) ×6 IMPLANT
SYR TB 1ML LUER SLIP (SYRINGE) ×2 IMPLANT
TOWEL GREEN STERILE (TOWEL DISPOSABLE) ×4 IMPLANT
TOWEL GREEN STERILE FF (TOWEL DISPOSABLE) ×4 IMPLANT
TRAY CATH 16FR W/PLASTIC CATH (SET/KITS/TRAYS/PACK) IMPLANT
WATER STERILE IRR 1000ML POUR (IV SOLUTION) IMPLANT
YANKAUER SUCT BULB TIP NO VENT (SUCTIONS) ×2 IMPLANT

## 2021-09-14 NOTE — Op Note (Signed)
NAME: Kaitlyn Kelly, Kaitlyn Kelly MEDICAL RECORD NO: [id number] ACCOUNT NO: [id number] DATE OF BIRTH: 09-05-1960 FACILITY: [facility] LOCATION: [id number] PHYSICIAN: Yetta Barre. Marlou Sa, MD  Operative Report   DATE OF PROCEDURE: 09/14/2021  PREOPERATIVE DIAGNOSIS:  Left knee arthritis.  POSTOPERATIVE DIAGNOSIS:  Left knee arthritis.  PROCEDURE:  Left total knee replacement using Triathlon press-fit components Stryker size 2 femur, posterior cruciate retaining size 3 tibia with 14 mm deep-dish polyethylene insert and 32 mm, 3-peg press-fit patella.  SURGEON:  Yetta Barre. Marlou Sa, MD.  ASSISTANT:  Annie Main, PA.  INDICATIONS:  The patient is a 61 year old patient with end-stage left knee arthritis, who presents for operative management after explanation of risks and benefits.  DESCRIPTION OF PROCEDURE:  The patient was brought to the operating room where spinal anesthetic was induced.  Preoperative antibiotics administered.  Timeout was called.  Left leg was prescrubbed with alcohol and Betadine, allowed to air dry, prepped  with DuraPrep solution and draped in a sterile manner.  The patient had about 5-7 degrees of hyperextension and flexed to about 125.  Left leg was covered with Ioban.  Timeout was called.  Leg was elevated and exsanguinated with the Esmarch wrap.   Tourniquet was inflated.  Midline approach to knee was made.  IrriSept solution utilized.  Median parapatellar approach was made and marked with #1 Vicryl suture.  IrriSept solution used again.  The patient had severe arthritis in the medial compartment  as well as patellofemoral compartment.  Lateral compartment changes were also present, but they were moderate.  Patella was everted.  Lateral patellofemoral ligament was released.  Fat pad partially excised.  Soft tissue removed from the anterior distal  femur.  Medial soft tissue dissection was performed proportional to the only mild varus deformity that the patient had preoperatively.  Next, the  intramedullary alignment was used to make a cut perpendicular to the mechanical axis 9 mm off the least  affected lateral tibial plateau.  Collaterals and posterior neurovascular structures were protected.  The tibial cut measured 10 mm off the lateral side and an 8 mm off the medial side.  Next, an 8 mm cut was made using intramedullary alignment on the  femur.  Cut in 5 degrees of valgus.  Femur sized to a size 2, tibia sized to a size 3.  Anterior, posterior and chamfer cuts were made.  Bone quality was excellent.  Trial tibial tray placed along with the trial femur.  We tried the 11 and 13 spacer.  13  spacer still had about 5 degrees of hyperextension.  We decided to go with the 14 spacer at that time.  Patella was then cut down from 20 mm to 11 mm and a 3 peg patellar trial was placed.  With 13 spacer and the patient had about 5 degrees of  hyperextension, but very good stability to varus and valgus stress at 0, 30 and 90 degrees.  Patellar tracked very nicely with no thumbs technique.  Trial components were removed.  Final preparation made on the tibia.  Irrigation performed with 3 liters  of irrigating solution followed by numbing up the capsule with Marcaine, Exparel and saline.  Next, for 3 minutes TXA sponge was allowed to sit within the incision along with IrriSept solution.  After this, IrriSept was placed into the intramedullary  canal of the tibia.  Vancomycin powder placed.  Tibia was keel punched into correct position in the correct rotation.  Femur had very good press fit as well.  A  14 spacer placed which gave about 2 degrees of hyperextension with same stability parameters  maintained.  Press-fit patella was placed with good press fit obtained.  Same stability parameters and tracking parameters maintained.  Tourniquet released.  Bleeding points encountered and controlled using electrocautery, pouring irrigation utilized at  this time.  The arthrotomy was then closed over a bolster using  #1 Vicryl suture, followed by irrigation with IrriSept and vancomycin powder placed into the joint just before final arthrotomy closure.  After arthrotomy closure, the knee joint was  injected with 10 mL of Marcaine, morphine, clonidine mixture.  Next, IrriSept solution and vancomycin powder placed above the arthrotomy .  The incision was then closed using a combination of 0 Vicryl suture, 2-0 Vicryl suture, and 3-0 Monocryl with  Steri-Strips and Aquacel dressing applied.  The patient tolerated the procedure well without immediate complications, transferred to recovery room in stable condition.  Count was incorrect, but no needles found on radiographic view of the knee, which did  appear to be in good alignment.  Luke's assistance was required as a medical necessity for retraction, opening and closing.  The patient was transferred to the recovery room in stable condition.   CHR D: 09/14/2021 1:30:11 pm T: 09/14/2021 3:00:00 pm  JOB: [id number]/ [id number]

## 2021-09-14 NOTE — Transfer of Care (Signed)
Immediate Anesthesia Transfer of Care Note  Patient: Kaitlyn Kelly  Procedure(s) Performed: LEFT TOTAL KNEE ARTHROPLASTY (Left: Knee)  Patient Location: PACU  Anesthesia Type:MAC, Regional and Spinal  Level of Consciousness: drowsy and patient cooperative  Airway & Oxygen Therapy: Patient Spontanous Breathing and Patient connected to face mask oxygen  Post-op Assessment: Report given to RN and Post -op Vital signs reviewed and stable  Post vital signs: Reviewed and stable  Last Vitals:  Vitals Value Taken Time  BP 101/61 09/14/21 1330  Temp    Pulse 86 09/14/21 1331  Resp 14 09/14/21 1331  SpO2 96 % 09/14/21 1331  Vitals shown include unvalidated device data.  Last Pain:  Vitals:   09/14/21 0923  TempSrc:   PainSc: 2       Patients Stated Pain Goal: 1 (09/81/19 1478)  Complications: No notable events documented.

## 2021-09-14 NOTE — Op Note (Deleted)
  The note originally documented on this encounter has been moved the the encounter in which it belongs.  

## 2021-09-14 NOTE — Evaluation (Signed)
Physical Therapy Evaluation Patient Details Name: Kaitlyn Kelly MRN: [id number] DOB: 1961/05/03 Today's Date: 09/14/2021  History of Present Illness  Pt is 61 yo female admitted 09/14/21 for L TKA.  Pt with hx including but not limited to OA, HTN, lumbar back sprain, and GERD.  Clinical Impression  Pt is s/p TKA resulting in the deficits listed below (see PT Problem List). At baseline, pt is independent and working.  She was using a cane due to L knee pain.  Today, pt limited by pain and lethargy.  She was min A transfers and able to take some steps at EOB but not able to progress safely from EOB.  Pt with limited ROM due to pain and limited quad activation.  Expected to progress well Pt will benefit from skilled PT to increase their independence and safety with mobility to allow discharge to the venue listed below.     For toileting tonight recommend use of KI, walker , and BSC. - RN aware     Recommendations for follow up therapy are one component of a multi-disciplinary discharge planning process, led by the attending physician.  Recommendations may be updated based on patient status, additional functional criteria and insurance authorization.  Follow Up Recommendations Follow physician's recommendations for discharge plan and follow up therapies    Assistance Recommended at Discharge Intermittent Supervision/Assistance  Patient can return home with the following  A little help with walking and/or transfers;A little help with bathing/dressing/bathroom;Help with stairs or ramp for entrance;Assistance with cooking/housework    Equipment Recommendations None recommended by PT  Recommendations for Other Services       Functional Status Assessment Patient has had a recent decline in their functional status and demonstrates the ability to make significant improvements in function in a reasonable and predictable amount of time.     Precautions / Restrictions Precautions Precautions:  Fall Required Braces or Orthoses: Knee Immobilizer - Left Knee Immobilizer - Left: Discontinue once straight leg raise with < 10 degree lag Restrictions Weight Bearing Restrictions: Yes LLE Weight Bearing: Weight bearing as tolerated      Mobility  Bed Mobility Overal bed mobility: Needs Assistance Bed Mobility: Supine to Sit, Sit to Supine     Supine to sit: Min assist Sit to supine: Min assist   General bed mobility comments: Min A for L LE    Transfers Overall transfer level: Needs assistance Equipment used: Rolling walker (2 wheels) Transfers: Sit to/from Stand Sit to Stand: Min assist           General transfer comment: Cues for hand placement and L LE managment with light min A to assist    Ambulation/Gait Ambulation/Gait assistance: Min assist Gait Distance (Feet): 3 Feet Assistive device: Rolling walker (2 wheels) Gait Pattern/deviations: Step-to pattern, Decreased stride length, Decreased stance time - left, Decreased weight shift to left Gait velocity: decreased     General Gait Details: Only taking steps at EOB due to lethargy and L LE weaknes.. Limited weight shift to L with pt pivoting on R LE to move toward [place]            Wheelchair Mobility    Modified Rankin (Stroke Patients Only)       Balance Overall balance assessment: Needs assistance Sitting-balance support: No upper extremity supported Sitting balance-Leahy Scale: Good     Standing balance support: Bilateral upper extremity supported, Reliant on assistive device for balance Standing balance-Leahy Scale: Poor  Pertinent Vitals/Pain Pain Assessment Pain Assessment: 0-10 Pain Score: 4  Pain Location: L knee Pain Descriptors / Indicators: Discomfort, Sore Pain Intervention(s): Limited activity within patient's tolerance, Monitored during session, Premedicated before session    [place] expects to be  discharged to:: Private residence Living Arrangements: Spouse/significant other Available Help at Discharge: Family;Available PRN/intermittently Type of Home: House Home Access: Stairs to enter Entrance Stairs-Rails: None Entrance Stairs-Number of Steps: 1   Home Layout: One level Home Equipment: Public relations account executive (2 wheels);Cane - single point;Shower seat;Wheelchair - manual;Hospital bed      Prior Function Prior Level of Function : Independent/Modified Independent;Working/employed;Driving             Mobility Comments: Was ambulating with cane b/c of knee but could ambulate in community and works (for [employer] as Therapist, sports in Tour manager) ADLs Comments: Indpendent     Hand Dominance        Extremity/Trunk Assessment   Upper Extremity Assessment Upper Extremity Assessment: Overall WFL for tasks assessed    Lower Extremity Assessment Lower Extremity Assessment: LLE deficits/detail;RLE deficits/detail RLE Deficits / Details: ROM WFL; MMT 5/5 RLE Sensation: WNL LLE Deficits / Details: ROM: ankle and hip WFL, knee 5 to 50 degrees limited by pain; MMT : ankle 5/5 , knee 1/5, hip 2/5 LLE Sensation: WNL    Cervical / Trunk Assessment Cervical / Trunk Assessment: Normal  Communication   Communication: No difficulties  Cognition Arousal/Alertness: Lethargic, Suspect due to medications Behavior During Therapy: WFL for tasks assessed/performed Overall Cognitive Status: Within Functional Limits for tasks assessed                                          General Comments      Exercises Total Joint Exercises Ankle Circles/Pumps: AROM, Both, 5 reps, Supine Quad Sets: AROM, Left, 5 reps, Supine   Assessment/Plan    PT Assessment Patient needs continued PT services  PT Problem List Decreased strength;Decreased mobility;Decreased safety awareness;Decreased range of motion;Decreased activity tolerance;Decreased balance;Decreased knowledge of use of DME        PT Treatment Interventions DME instruction;Therapeutic activities;Gait training;Therapeutic exercise;Patient/family education;Modalities;Stair training;Balance training;Functional mobility training    PT Goals (Current goals can be found in the Care Plan section)  Acute Rehab PT Goals Patient Stated Goal: return home PT Goal Formulation: With patient/family Time For Goal Achievement: 09/28/21 Potential to Achieve Goals: Good    Frequency 7X/week     Co-evaluation               AM-PAC PT "6 Clicks" Mobility  Outcome Measure Help needed turning from your back to your side while in a flat bed without using bedrails?: A Little Help needed moving from lying on your back to sitting on the side of a flat bed without using bedrails?: A Little Help needed moving to and from a bed to a chair (including a wheelchair)?: A Little Help needed standing up from a chair using your arms (e.g., wheelchair or bedside chair)?: A Little Help needed to walk in hospital room?: Total Help needed climbing 3-5 steps with a railing? : Total 6 Click Score: 14    End of Session Equipment Utilized During Treatment: Gait belt Activity Tolerance: Patient limited by lethargy Patient left: in bed;with call bell/phone within reach;with bed alarm set;with family/visitor present (ice cuff, SCD, and KI replaced) Nurse Communication: Mobility status (recommend BSC instead  of bathroom tonight) PT Visit Diagnosis: Other abnormalities of gait and mobility (R26.89);Muscle weakness (generalized) (M62.81)    Time: 1637-1700 PT Time Calculation (min) (ACUTE ONLY): 23 min   Charges:   PT Evaluation $PT Eval Low Complexity: 1 Low PT Treatments $Therapeutic Activity: 8-22 mins        Abran Richard, PT Acute Rehab Services Pager [phone] [facility] [phone]   Kaitlyn Kelly 09/14/2021, 5:08 PM

## 2021-09-14 NOTE — H&P (Signed)
TOTAL KNEE ADMISSION H&P  Patient is being admitted for left total knee arthroplasty.  Subjective:  Chief Complaint:left knee pain.  HPI: Kaitlyn Kelly, 61 y.o. female, has a history of pain and functional disability in the left knee due to arthritis and has failed non-surgical conservative treatments for greater than 12 weeks to includeNSAID's and/or analgesics, flexibility and strengthening excercises, and activity modification.  Onset of symptoms was abrupt, starting 1 years ago with rapidlly worsening course since that time. The patient noted no past surgery on the left knee(s).  Patient currently rates pain in the left knee(s) at 9 out of 10 with activity. Patient has night pain, worsening of pain with activity and weight bearing, pain that interferes with activities of daily living, pain with passive range of motion, crepitus, and joint swelling.  Patient has evidence of subchondral sclerosis and joint space narrowing by imaging studies. This patient has had  right knee meniscal root repair and did well with that.  She has global knee pain as well as severe patellofemoral arthritis and medial compartment arthritis based on MRI scanning.  No personal or family history of DVT or pulmonary embolism. . There is no active infection.  Patient Active Problem List   Diagnosis Date Noted   Acquired hypothyroidism 06/23/2021   Arthralgia of multiple joints 06/23/2021   Gastroesophageal reflux disease 06/23/2021   Localized morphea 06/23/2021   Lumbar back sprain 06/23/2021   Menopause 06/23/2021   Osteoarthritis 06/23/2021   Acute medial meniscus tear of right knee    Vulvar irritation 41/74/0814   Lichen sclerosus 48/18/5631   Pain in both hands 01/17/2020   Vitamin D deficiency 05/20/2009   Allergic rhinitis 05/20/2009   ELEVATED BLOOD PRESSURE WITHOUT DIAGNOSIS OF HYPERTENSION 05/20/2009   HASHIMOTO'S THYROIDITIS 12/07/2007   Obesity 06/11/2007   Essential hypertension 06/11/2007    Exacerbation of asthma 06/11/2007   HEMATURIA, HX OF 06/11/2007   Past Medical History:  Diagnosis Date   Anal fissure    related to Lichens Sclerosis   Arthritis    Asthma    Hemorrhoids    History of kidney stones    Hypertension    Hypothyroidism    Medical history non-contributory    Pneumonia    PONV (postoperative nausea and vomiting)    Thyroid disease     Past Surgical History:  Procedure Laterality Date   COLONOSCOPY     had a 2014, 2017   KNEE ARTHROSCOPY WITH MENISCAL REPAIR Right 03/30/2021   Procedure: right knee meniscal root tear repair; arthroscopy;  Surgeon: Meredith Pel, MD;  Location: [facility];  Service: Orthopedics;  Laterality: Right;   TOTAL ABDOMINAL HYSTERECTOMY Left 08/30/2002   TAH/RSO/LO cystectomy for bleeding, bilateral cysts   TUBAL LIGATION     VULVA /PERINEUM BIOPSY  03/08/2020        Current Facility-Administered Medications  Medication Dose Route Frequency Provider Last Rate Last Admin   ceFAZolin (ANCEF) 2-4 GM/100ML-% IVPB            ceFAZolin (ANCEF) IVPB 2g/100 mL premix  2 g Intravenous On Call to OR Magnant, Charles L, PA-C       lactated ringers infusion   Intravenous Continuous Janeece Riggers, MD 10 mL/hr at 09/14/21 0927 New Bag at 09/14/21 0927   povidone-iodine (BETADINE) 7.5 % scrub   Topical Once Magnant, Charles L, PA-C       tranexamic acid (CYKLOKAPRON) 1000MG /14mL IVPB            tranexamic  acid (CYKLOKAPRON) 2,000 mg in sodium chloride 0.9 % 50 mL Topical Application  7,169 mg Topical To OR Meredith Pel, MD       tranexamic acid (CYKLOKAPRON) IVPB 1,000 mg  1,000 mg Intravenous To OR Magnant, Charles L, PA-C       Allergies  Allergen Reactions   Shingrix [Zoster Vac Recomb Adjuvanted]     LICHEN'S FLARE   Sulfonamide Derivatives Rash    Social History   Tobacco Use   Smoking status: Never   Smokeless tobacco: Never  Substance Use Topics   Alcohol use: Yes    Alcohol/week: 1.0 - 2.0 standard drink     Types: 1 - 2 Glasses of wine per week    Comment: socially    Family History  Problem Relation Age of Onset   Heart disease Mother    Alcohol abuse Mother    Asthma Mother    Depression Mother    Drug abuse Mother    Early death Mother    Hypertension Mother    Thyroid disease Sister    Diabetes Sister    Asthma Sister    Drug abuse Sister    Hypertension Sister    Psoriasis Sister    Interstitial cystitis Sister    Colon cancer Neg Hx    Esophageal cancer Neg Hx    Pancreatic cancer Neg Hx    Prostate cancer Neg Hx    Rectal cancer Neg Hx      Review of Systems  Musculoskeletal:  Positive for arthralgias.  All other systems reviewed and are negative.  Objective:  Physical Exam Vitals reviewed.  HENT:     Head: Normocephalic.     Nose: Nose normal.     Mouth/Throat:     Mouth: Mucous membranes are moist.  Eyes:     Pupils: Pupils are equal, round, and reactive to light.  Cardiovascular:     Rate and Rhythm: Normal rate.     Pulses: Normal pulses.  Pulmonary:     Effort: Pulmonary effort is normal.  Abdominal:     General: Abdomen is flat.  Musculoskeletal:     Cervical back: Normal range of motion.  Skin:    General: Skin is warm.     Capillary Refill: Capillary refill takes less than 2 seconds.  Neurological:     General: No focal deficit present.     Mental Status: She is alert.  Psychiatric:        Mood and Affect: Mood normal.    Vital signs in last 24 hours: Temp:  [98.8 F (37.1 C)] 98.8 F (37.1 C) (02/28 0912) Pulse Rate:  [84-90] 84 (02/28 0956) Resp:  [9-91] 9 (02/28 0956) BP: (115-160)/(48-95) 115/67 (02/28 0956) SpO2:  [88 %-100 %] 91 % (02/28 0956) FiO2 (%):  [92 %] 92 % (02/28 0951) Weight:  [99.8 kg] 99.8 kg (02/28 0912)  Labs:   Estimated body mass index is 36.33 kg/m as calculated from the following:   Height as of this encounter: 5' 5.25" (1.657 m).   Weight as of this encounter: 99.8 kg.   Imaging Review Plain  radiographs demonstrate moderate degenerative joint disease of the left knee(s). The overall alignment isneutral. The bone quality appears to be good for age and reported activity level.      Assessment/Plan:  End stage arthritis, left knee   The patient history, physical examination, clinical judgment of the provider and imaging studies are consistent with end stage degenerative joint disease of  the left knee(s) and total knee arthroplasty is deemed medically necessary. The treatment options including medical management, injection therapy arthroscopy and arthroplasty were discussed at length. The risks and benefits of total knee arthroplasty were presented and reviewed. The risks due to aseptic loosening, infection, stiffness, patella tracking problems, thromboembolic complications and other imponderables were discussed. The patient acknowledged the explanation, agreed to proceed with the plan and consent was signed. Patient is being admitted for inpatient treatment for surgery, pain control, PT, OT, prophylactic antibiotics, VTE prophylaxis, progressive ambulation and ADL's and discharge planning. The patient is planning to be discharged home with home health services     Patient's anticipated LOS is less than 2 midnights, meeting these requirements: - Younger than 22 - Lives within 1 hour of care - Has a competent adult at home to recover with post-op recover - NO history of  - Chronic pain requiring opiods  - Diabetes  - Coronary Artery Disease  - Heart failure  - Heart attack  - Stroke  - DVT/VTE  - Cardiac arrhythmia  - Respiratory Failure/COPD  - Renal failure  - Anemia  - Advanced Liver disease

## 2021-09-14 NOTE — Anesthesia Procedure Notes (Signed)
Spinal  Patient location during procedure: OR Start time: 09/14/2021 10:40 AM End time: 09/14/2021 10:45 AM Reason for block: surgical anesthesia Staffing Anesthesiologist: Janeece Riggers, MD Preanesthetic Checklist Completed: patient identified, IV checked, site marked, risks and benefits discussed, surgical consent, monitors and equipment checked, pre-op evaluation and timeout performed Spinal Block Patient position: sitting Prep: DuraPrep Patient monitoring: heart rate, cardiac monitor, continuous pulse ox and blood pressure Approach: midline Location: L3-4 Injection technique: single-shot Needle Needle type: Sprotte  Needle gauge: 24 G Needle length: 9 cm Assessment Sensory level: T4 Events: CSF return

## 2021-09-14 NOTE — Anesthesia Procedure Notes (Signed)
Anesthesia Regional Block: Adductor canal block   Pre-Anesthetic Checklist: , timeout performed,  Correct Patient, Correct Site, Correct Laterality,  Correct Procedure, Correct Position, site marked,  Risks and benefits discussed,  Surgical consent,  Pre-op evaluation,  At surgeon's request and post-op pain management  Laterality: Left  Prep: chloraprep       Needles:  Injection technique: Single-shot  Needle Type: Echogenic Stimulator Needle     Needle Length: 5cm  Needle Gauge: 22     Additional Needles:   Procedures:, nerve stimulator,,, ultrasound used (permanent image in chart),,    Narrative:  Start time: 09/14/2021 9:50 AM End time: 09/14/2021 9:55 AM Injection made incrementally with aspirations every 5 mL.  Performed by: Personally  Anesthesiologist: Janeece Riggers, MD  Additional Notes: Functioning IV was confirmed and monitors were applied.  A 67mm 22ga Arrow echogenic stimulator needle was used. Sterile prep and drape,hand hygiene and sterile gloves were used. Ultrasound guidance: relevant anatomy identified, needle position confirmed, local anesthetic spread visualized around nerve(s)., vascular puncture avoided.  Image printed for medical record. Negative aspiration and negative test dose prior to incremental administration of local anesthetic. The patient tolerated the procedure well.

## 2021-09-14 NOTE — Brief Op Note (Signed)
° °  09/14/2021  1:22 PM  PATIENT:  Kaitlyn Kelly  61 y.o. female  PRE-OPERATIVE DIAGNOSIS:  left knee osteoarthritis  POST-OPERATIVE DIAGNOSIS:  left knee osteoarthritis  PROCEDURE:  Procedure(s): LEFT TOTAL KNEE ARTHROPLASTY  SURGEON:  Surgeon(s): Marlou Sa, Tonna Corner, MD  ASSISTANT: magnant pa  ANESTHESIA:   spinal  EBL: 75 at most ml    Total I/O In: 1000 [I.V.:1000] Out: 600 [Urine:400; Blood:200]  BLOOD ADMINISTERED: none  DRAINS: none   LOCAL MEDICATIONS USED: Marcaine morphine clonidine Exparel vancomycin powder  SPECIMEN:  No Specimen  COUNTS:  YES  TOURNIQUET:   Total Tourniquet Time Documented: Thigh (Left) - 68 minutes Total: Thigh (Left) - 68 minutes   DICTATION: [id number]  PLAN OF CARE: Admit for overnight observation  PATIENT DISPOSITION:  PACU - hemodynamically stable

## 2021-09-14 NOTE — Progress Notes (Signed)
Pt arrived to unit alert and oriented  X4, c/o pain, husband notified, bed lowest position call bell within reach

## 2021-09-14 NOTE — Anesthesia Preprocedure Evaluation (Signed)
Anesthesia Evaluation  Patient identified by MRN, date of birth, ID band Patient awake    Reviewed: Allergy & Precautions, H&P , NPO status , Patient's Chart, lab work & pertinent test results  History of Anesthesia Complications (+) PONV and history of anesthetic complications  Airway Mallampati: II  TM Distance: >3 FB Neck ROM: Full    Dental no notable dental hx. (+) Teeth Intact, Dental Advisory Given,  PROMINENT LOWER INCISORS:   Pulmonary asthma ,    Pulmonary exam normal breath sounds clear to auscultation       Cardiovascular Exercise Tolerance: Good hypertension, Pt. on medications Normal cardiovascular exam Rhythm:Regular Rate:Normal     Neuro/Psych negative neurological ROS  negative psych ROS   GI/Hepatic Neg liver ROS,   Endo/Other  Hypothyroidism Morbid obesity  Renal/GU negative Renal ROS  negative genitourinary   Musculoskeletal  (+) Arthritis ,   Abdominal   Peds negative pediatric ROS (+)  Hematology negative hematology ROS (+)   Anesthesia Other Findings   Reproductive/Obstetrics negative OB ROS                             Anesthesia Physical  Anesthesia Plan  ASA: 2  Anesthesia Plan: MAC and Spinal   Post-op Pain Management: Regional block*   Induction: Intravenous  PONV Risk Score and Plan: 3 and Ondansetron, Treatment may vary due to age or medical condition and Propofol infusion  Airway Management Planned: Nasal Cannula, Natural Airway and Simple Face Mask  Additional Equipment: None  Intra-op Plan:   Post-operative Plan:   Informed Consent: I have reviewed the patients History and Physical, chart, labs and discussed the procedure including the risks, benefits and alternatives for the proposed anesthesia with the patient or authorized representative who has indicated his/her understanding and acceptance.       Plan Discussed with: CRNA and  Anesthesiologist  Anesthesia Plan Comments: ( )        Anesthesia Quick Evaluation

## 2021-09-15 LAB — BASIC METABOLIC PANEL
Anion gap: 11 (ref 5–15)
BUN: 13 mg/dL (ref 6–20)
CO2: 26 mmol/L (ref 22–32)
Calcium: 8.6 mg/dL — ABNORMAL LOW (ref 8.9–10.3)
Chloride: 100 mmol/L (ref 98–111)
Creatinine, Ser: 1.03 mg/dL — ABNORMAL HIGH (ref 0.44–1.00)
GFR, Estimated: >60 mL/min (ref >60)
Glucose, Bld: 113 mg/dL — ABNORMAL HIGH (ref 70–99)
Potassium: 3.3 mmol/L — ABNORMAL LOW (ref 3.5–5.1)
Sodium: 137 mmol/L (ref 135–145)

## 2021-09-15 LAB — CBC
HCT: 35.8 % — ABNORMAL LOW (ref 36.0–46.0)
Hemoglobin: 11.2 g/dL — ABNORMAL LOW (ref 12.0–15.0)
MCH: 26.5 pg (ref 26.0–34.0)
MCHC: 31.3 g/dL (ref 30.0–36.0)
MCV: 84.6 fL (ref 80.0–100.0)
Platelets: 290 10*3/uL (ref 150–400)
RBC: 4.23 MIL/uL (ref 3.87–5.11)
RDW: 14.4 % (ref 11.5–15.5)
WBC: 10.1 10*3/uL (ref 4.0–10.5)
nRBC: 0 % (ref 0.0–0.2)

## 2021-09-15 NOTE — TOC Initial Note (Signed)
Transition of Care (TOC) - Initial/Assessment Note  ? ? ?Patient Details  ?Name: Kaitlyn Kelly ?MRN: [id number] ?Date of Birth: 1960/10/14 ? ?Transition of Care [facility]) CM/SW Contact:    ?Sharin Mons, RN ?Phone Number: ?09/15/2021, 4:36 PM ? ?Clinical Narrative:      ?Admitted 09/14/2021 for [place].            ?From home with spouse. NCM spoke with pt regarding d/c planning. Pt without DME needs. Already has RW, [facility]. ? ?Per PT's eval./ recommendation: Follow physician's recommendations for discharge plan and follow up therapies. Order noted from MD for home health PT. Pt's insurance will note cover home health services, NCM made pt and provider aware. Outpatient PT offered. Pt not interested. Family to care and assist pt once d/c. Pt states will follow exercises provided by PT once home. ? ?Pt without concerns regarding Rx meds co-pay. ? ?Husband to provide transportation to home when d/c. ? ?TOC team will continue to monitor and assist with needs... ? ?Expected Discharge Plan: Home/Self Care ?Barriers to Discharge: Continued Medical Work up ? ? ?Patient Goals and CMS Choice ?  ?  ?  ? ?Expected Discharge Plan and Services ?Expected Discharge Plan: Home/Self Care ?  ?Discharge Planning Services: CM Consult ?  ?Living arrangements for the past 2 months: [place] ?                ?  ?  ?  ?  ?  ?  ?  ?  ?  ?  ? ?Prior Living Arrangements/Services ?Living arrangements for the past 2 months: [place] ?Lives with:: Spouse ?Patient language and need for interpreter reviewed:: Yes ?Do you feel safe going back to the place where you live?: Yes      ?Need for Family Participation in Patient Care: Yes (Comment) ?Care giver support system in place?: Yes (comment) ?  ?Criminal Activity/Legal Involvement Pertinent to Current Situation/Hospitalization: No - Comment as needed ? ?Activities of Daily Living ?Home Assistive Devices/Equipment: Gilford Rile (specify type) ?ADL Screening (condition at time of  admission) ?Patient's cognitive ability adequate to safely complete daily activities?: Yes ?Is the patient deaf or have difficulty hearing?: No ?Does the patient have difficulty seeing, even when wearing glasses/contacts?: No ?Does the patient have difficulty concentrating, remembering, or making decisions?: No ?Patient able to express need for assistance with ADLs?: No ?Does the patient have difficulty dressing or bathing?: No ?Independently performs ADLs?: Yes (appropriate for developmental age) ?Communication: Independent ?Dressing (OT): Independent ?Grooming: Independent ?Feeding: Independent ?Bathing: Independent ?Toileting: Independent ?In/Out Bed: Independent ?Walks in Home: Independent ?Does the patient have difficulty walking or climbing stairs?: Yes ?Weakness of Legs: Left ?Weakness of Arms/Hands: None ? ?Permission Sought/Granted ?  ?Permission granted to share information with : Yes, Verbal Permission Granted ? Share Information with NAME: CHRISTAL LAGERSTROM (Spouse) [phone] ?   ?   ?   ? ?Emotional Assessment ?Appearance:: Appears stated age ?Attitude/Demeanor/Rapport: Gracious ?Affect (typically observed): Accepting ?Orientation: : Oriented to Self, Oriented to Place, Oriented to  Time, Oriented to Situation ?Alcohol / Substance Use: Not Applicable ?Psych Involvement: No (comment) ? ?Admission diagnosis:  S/P TKR (total knee replacement), left [Z96.652] ?Patient Active Problem List  ? Diagnosis Date Noted  ? S/P TKR (total knee replacement), left 09/14/2021  ? Acquired hypothyroidism 06/23/2021  ? Arthralgia of multiple joints 06/23/2021  ? Gastroesophageal reflux disease 06/23/2021  ? Localized morphea 06/23/2021  ? Lumbar back sprain 06/23/2021  ? Menopause 06/23/2021  ?  Osteoarthritis 06/23/2021  ? Acute medial meniscus tear of right knee   ? Vulvar irritation 03/04/2020  ? Lichen sclerosus 52/17/4715  ? Pain in both hands 01/17/2020  ? Vitamin D deficiency 05/20/2009  ? Allergic rhinitis 05/20/2009   ? ELEVATED BLOOD PRESSURE WITHOUT DIAGNOSIS OF HYPERTENSION 05/20/2009  ? HASHIMOTO'S THYROIDITIS 12/07/2007  ? Obesity 06/11/2007  ? Essential hypertension 06/11/2007  ? Exacerbation of asthma 06/11/2007  ? HEMATURIA, HX OF 06/11/2007  ? ?PCP:  Maximiano Coss, NP ?Pharmacy:   ?[facility] ?[address] ?[place] [address] ?Phone: [phone] Fax: [phone] ? ? ? ? ?Social Determinants of Health (SDOH) Interventions ?  ? ?Readmission Risk Interventions ?No flowsheet data found. ? ? ?

## 2021-09-15 NOTE — Progress Notes (Signed)
?  Subjective: ?Kaitlyn Kelly is a 61 y.o. female s/p left TKA.  They are POD 1.  Pt's pain is moderate but controlled.  Pt denies numbness/tingling/weakness.  Patient was able to work with physical therapy yesterday.  Denies any dizziness or lightheadedness in bed.  No chest pain or shortness of breath.  No calf pain.  She only has a couple stairs to get into her 1 level house. ? ?Objective: ?Vital signs in last 24 hours: ?Temp:  [97.6 ?F (36.4 ?C)-98.8 ?F (37.1 ?C)] 98 ?F (36.7 ?C) (03/01 0411) ?Pulse Rate:  [59-90] 90 (03/01 0411) ?Resp:  [9-91] 20 (03/01 0411) ?BP: (93-160)/(48-95) 100/59 (03/01 0411) ?SpO2:  [88 %-100 %] 99 % (03/01 0411) ?FiO2 (%):  [92 %] 92 % (02/28 0951) ?Weight:  [99.8 kg] 99.8 kg (02/28 0912) ? ?Intake/Output from previous day: ?02/28 0701 - 03/01 0700 ?In: 1364.5 [P.O.:120; I.V.:1244.5] ?Out: 600 [Urine:400; Blood:200] ?Intake/Output this shift: ?No intake/output data recorded. ? ?Exam: ? ?No gross blood or drainage overlying the dressing ?2+ DP pulse ?Sensation intact distally in the left foot ?Able to dorsiflex and plantarflex the left foot ?No calf tenderness.  Negative Homans' sign.  Not able to perform straight leg raise yet. ? ? ?Labs: ?Recent Labs  ?  09/15/21 ?0429  ?HGB 11.2*  ? ?Recent Labs  ?  09/15/21 ?0429  ?WBC 10.1  ?RBC 4.23  ?HCT 35.8*  ?PLT 290  ? ?Recent Labs  ?  09/15/21 ?0429  ?NA 137  ?K 3.3*  ?CL 100  ?CO2 26  ?BUN 13  ?CREATININE 1.03*  ?GLUCOSE 113*  ?CALCIUM 8.6*  ? ?No results for input(s): LABPT, INR in the last 72 hours. ? ?Assessment/Plan: ?Pt is POD 1 s/p left TKA.   ? -Plan to discharge to home in coming days pending patient's pain and PT eval ? -WBAT with a walker ? -Anticipate discharge likely tomorrow as she feels she may need a couple days of therapy.  But we will see how therapy goes and how she progresses from this morning to this afternoon. ?  ? ? ?Gerrianne Scale Janalee Grobe ?09/15/2021, 8:07 AM  ? ? ?   ?

## 2021-09-15 NOTE — Progress Notes (Signed)
Physical Therapy Treatment ?Patient Details ?Name: Kaitlyn Kelly ?MRN: [id number] ?DOB: [birth year] ?Today's Date: 09/15/2021 ? ? ?History of Present Illness Pt is 61 yo female admitted 09/14/21 for L TKA.  Pt with hx including but not limited to OA, HTN, lumbar back sprain, and GERD. ? ?  ?PT Comments  ? ? PM Session: Pt received in recliner and agreeable to session with continued progress towards goals. Pt able to ascend/descend 2 stairs in therapy gym with up to mod assist for cueing only with no physical assist needed to complete. Pt continues to progress with gait training with good recall of LE sequencing, needing min assist for safety and light cueing. Reiterated education re; precautions, positioning, ice, HEP compliance, pt verbalized understanding. Anticipate safe pt discharge with family assistance once medically cleared, will follow acutely. Pt continues to benefit from skilled PT services to progress toward functional mobility goals.  ? ?  ?Recommendations for follow up therapy are one component of a multi-disciplinary discharge planning process, led by the attending physician.  Recommendations may be updated based on patient status, additional functional criteria and insurance authorization. ? ?Follow Up Recommendations ? Follow physician's recommendations for discharge plan and follow up therapies ?  ?  ?Assistance Recommended at Discharge Intermittent Supervision/Assistance  ?Patient can return home with the following A little help with walking and/or transfers;A little help with bathing/dressing/bathroom;Help with stairs or ramp for entrance;Assistance with cooking/housework ?  ?Equipment Recommendations ? None recommended by PT  ?  ?Recommendations for Other Services   ? ? ?  ?Precautions / Restrictions Precautions ?Precautions: Fall ?Restrictions ?Weight Bearing Restrictions: Yes ?LLE Weight Bearing: Weight bearing as tolerated  ?  ? ?Mobility ? Bed Mobility ?  ?  ?  ?  ?  ?  ?  ?General bed  mobility comments: OOB in recliner on arrival ?  ? ?Transfers ?Overall transfer level: Needs assistance ?Equipment used: Rolling walker (2 wheels) ?Transfers: Sit to/from Stand ?Sit to Stand: Min guard ?  ?  ?  ?  ?  ?General transfer comment: light cues for hand placement with good pt recall ?  ? ?Ambulation/Gait ?Ambulation/Gait assistance: Min assist ?Gait Distance (Feet): 25 Feet ?Assistive device: Rolling walker (2 wheels) ?Gait Pattern/deviations: Step-to pattern, Decreased stride length, Decreased stance time - left, Decreased weight shift to left ?Gait velocity: decreased ?  ?  ?General Gait Details: slow stedy gait with good recall of sequencing ? ? ?Stairs ?Stairs: Yes ?Stairs assistance: Min assist, Mod assist (mod assist for cues only) ?Stair Management: Two rails, Step to pattern, Forwards ?Number of Stairs: 2 ?General stair comments: up/down 2 stairs in therapy gym with min assist for sequencing an safety ? ? ?Wheelchair Mobility ?  ? ?Modified Rankin (Stroke Patients Only) ?  ? ? ?  ?Balance Overall balance assessment: Needs assistance ?Sitting-balance support: No upper extremity supported ?Sitting balance-Leahy Scale: Good ?  ?  ?Standing balance support: Bilateral upper extremity supported, Reliant on assistive device for balance ?Standing balance-Leahy Scale: Fair ?Standing balance comment: able to static standwithout UE support for peri-care ?  ?  ?  ?  ?  ?  ?  ?  ?  ?  ?  ?  ? ?  ?Cognition Arousal/Alertness: Awake/alert ?Behavior During Therapy: [facility] for tasks assessed/performed ?Overall Cognitive Status: Within Functional Limits for tasks assessed ?  ?  ?  ?  ?  ?  ?  ?  ?  ?  ?  ?  ?  ?  ?  ?  ?  ?  ?  ? ?  ?  Exercises Total Joint Exercises ?Ankle Circles/Pumps: AROM, Both, 10 reps, Supine ?Quad Sets: AROM, Left, 5 reps, Supine ?Heel Slides: AROM, AAROM, Left, 5 reps, Supine, Seated ?Hip ABduction/ADduction: AROM, AAROM, Left, 5 reps, Supine ?Straight Leg Raises: AROM, AAROM, Left, 5 reps,  Supine ?Long Arc Quad: AROM, AAROM, Left, 5 reps, Seated ?Knee Flexion: PROM, Left, 5 reps, Seated (with RLE over pressure) ? ?  ?General Comments   ?  ?  ? ?Pertinent Vitals/Pain Pain Assessment ?Pain Assessment: 0-10 ?Pain Score: 5  ?Pain Location: L knee ?Pain Descriptors / Indicators: Discomfort, Sore ?Pain Intervention(s): Limited activity within patient's tolerance, Monitored during session, Repositioned, Ice applied  ? ? ?Home Living   ?  ?  ?  ?  ?  ?  ?  ?  ?  ?   ?  ?Prior Function    ?  ?  ?   ? ?PT Goals (current goals can now be found in the care plan section) Acute Rehab PT Goals ?Patient Stated Goal: return home ?PT Goal Formulation: With patient/family ?Time For Goal Achievement: 09/28/21 ? ?  ?Frequency ? ? ? 7X/week ? ? ? ?  ?PT Plan    ? ? ?Co-evaluation   ?  ?  ?  ?  ? ?  ?AM-PAC PT "6 Clicks" Mobility   ?Outcome Measure ? Help needed turning from your back to your side while in a flat bed without using bedrails?: A Little ?Help needed moving from lying on your back to sitting on the side of a flat bed without using bedrails?: A Little ?Help needed moving to and from a bed to a chair (including a wheelchair)?: A Little ?Help needed standing up from a chair using your arms (e.g., wheelchair or bedside chair)?: A Little ?Help needed to walk in hospital room?: A Little ?Help needed climbing 3-5 steps with a railing? : A Lot ?6 Click Score: 17 ? ?  ?End of Session Equipment Utilized During Treatment: Gait belt ?Activity Tolerance: Patient tolerated treatment well ?Patient left: in chair;with call bell/phone within reach ?Nurse Communication: Mobility status ?PT Visit Diagnosis: Other abnormalities of gait and mobility (R26.89);Muscle weakness (generalized) (M62.81) ?  ? ? ?Time: 5409-8119 ?PT Time Calculation (min) (ACUTE ONLY): 35 min ? ?Charges:  $Gait Training: 8-22 mins ?$Therapeutic Exercise: 8-22 mins          ?          ? ?Audry Riles. PTA ?Acute Rehabilitation Services ?Office:  [phone] ? ? ? ?Betsey Holiday Roxy Filler ?09/15/2021, 2:15 PM ? ?

## 2021-09-15 NOTE — Progress Notes (Signed)
Physical Therapy Treatment ?Patient Details ?Name: Kaitlyn Kelly ?MRN: [id number] ?DOB: 08-12-1960 ?Today's Date: 09/15/2021 ? ? ?History of Present Illness Pt is 61 yo female admitted 09/14/21 for L TKA.  Pt with hx including but not limited to OA, HTN, lumbar back sprain, and GERD. ? ?  ?PT Comments  ? ? AM session: Pt received OOB in recliner and agreeable to session with fair progress towards goals. Pt required grossly min assist for transfers and gait, with cues throughout for sequencing, ambulation distance limited by pain. Educated pt on HEP exercises for increased strength and ROM and pt able to demo back all with good recall. Educated pt re: ice, proper knee positioning to encourage extension, use of KI and all precautions, pt verbalized understanding. Plan to progress gait training and initiate stair training this afternoon to pt tolerance. Pt continues to benefit from skilled PT services to progress toward functional mobility goals.  ?  ?Recommendations for follow up therapy are one component of a multi-disciplinary discharge planning process, led by the attending physician.  Recommendations may be updated based on patient status, additional functional criteria and insurance authorization. ? ?Follow Up Recommendations ? Follow physician's recommendations for discharge plan and follow up therapies ?  ?  ?Assistance Recommended at Discharge Intermittent Supervision/Assistance  ?Patient can return home with the following A little help with walking and/or transfers;A little help with bathing/dressing/bathroom;Help with stairs or ramp for entrance;Assistance with cooking/housework ?  ?Equipment Recommendations ? None recommended by PT  ?  ?Recommendations for Other Services   ? ? ?  ?Precautions / Restrictions Precautions ?Precautions: Fall ?Restrictions ?Weight Bearing Restrictions: Yes ?LLE Weight Bearing: Weight bearing as tolerated  ?  ? ?Mobility ? Bed Mobility ?  ?  ?  ?  ?  ?  ?  ?General bed mobility  comments: OOB in recliner on arrival ?  ? ?Transfers ?Overall transfer level: Needs assistance ?Equipment used: Rolling walker (2 wheels) ?Transfers: Sit to/from Stand ?Sit to Stand: Min assist ?  ?  ?  ?  ?  ?General transfer comment: Cues for hand placement and L LE managment with light min A to assist ?  ? ?Ambulation/Gait ?Ambulation/Gait assistance: Min assist ?Gait Distance (Feet): 30 Feet ?Assistive device: Rolling walker (2 wheels) ?Gait Pattern/deviations: Step-to pattern, Decreased stride length, Decreased stance time - left, Decreased weight shift to left ?Gait velocity: decreased ?  ?  ?General Gait Details: slow stedy gait with cues for sequencing, RW proximity, and using UE to unweight painful LE ? ? ?Stairs ?  ?  ?  ?  ?  ? ? ?Wheelchair Mobility ?  ? ?Modified Rankin (Stroke Patients Only) ?  ? ? ?  ?Balance Overall balance assessment: Needs assistance ?Sitting-balance support: No upper extremity supported ?Sitting balance-Leahy Scale: Good ?  ?  ?Standing balance support: Bilateral upper extremity supported, Reliant on assistive device for balance ?Standing balance-Leahy Scale: Fair ?Standing balance comment: able to static stand at sink for ADLs without UE support ?  ?  ?  ?  ?  ?  ?  ?  ?  ?  ?  ?  ? ?  ?Cognition Arousal/Alertness: Awake/alert ?Behavior During Therapy: [facility] for tasks assessed/performed ?Overall Cognitive Status: Within Functional Limits for tasks assessed ?  ?  ?  ?  ?  ?  ?  ?  ?  ?  ?  ?  ?  ?  ?  ?  ?  ?  ?  ? ?  ?  Exercises Total Joint Exercises ?Ankle Circles/Pumps: AROM, Both, 10 reps, Supine ?Quad Sets: AROM, Left, 5 reps, Supine ?Heel Slides: AROM, AAROM, Left, 5 reps, Supine ?Hip ABduction/ADduction: AROM, AAROM, Left, 5 reps, Supine ?Straight Leg Raises: AROM, AAROM, Left, 5 reps, Supine ? ?  ?General Comments   ?  ?  ? ?Pertinent Vitals/Pain Pain Assessment ?Pain Assessment: 0-10 ?Pain Score: 7  ?Pain Location: L knee ?Pain Descriptors / Indicators: Discomfort,  Sore ?Pain Intervention(s): Limited activity within patient's tolerance, Monitored during session, Repositioned, Premedicated before session, Ice applied  ? ? ?Home Living   ?  ?  ?  ?  ?  ?  ?  ?  ?  ?   ?  ?Prior Function    ?  ?  ?   ? ?PT Goals (current goals can now be found in the care plan section) Acute Rehab PT Goals ?Patient Stated Goal: return home ?PT Goal Formulation: With patient/family ?Time For Goal Achievement: 09/28/21 ? ?  ?Frequency ? ? ? 7X/week ? ? ? ?  ?PT Plan    ? ? ?Co-evaluation   ?  ?  ?  ?  ? ?  ?AM-PAC PT "6 Clicks" Mobility   ?Outcome Measure ? Help needed turning from your back to your side while in a flat bed without using bedrails?: A Little ?Help needed moving from lying on your back to sitting on the side of a flat bed without using bedrails?: A Little ?Help needed moving to and from a bed to a chair (including a wheelchair)?: A Little ?Help needed standing up from a chair using your arms (e.g., wheelchair or bedside chair)?: A Little ?Help needed to walk in hospital room?: A Little ?Help needed climbing 3-5 steps with a railing? : Total ?6 Click Score: 16 ? ?  ?End of Session Equipment Utilized During Treatment: Gait belt ?Activity Tolerance: Patient tolerated treatment well ?Patient left: in chair;with call bell/phone within reach ?Nurse Communication: Mobility status ?PT Visit Diagnosis: Other abnormalities of gait and mobility (R26.89);Muscle weakness (generalized) (M62.81) ?  ? ? ?Time: 5038-8828 ?PT Time Calculation (min) (ACUTE ONLY): 32 min ? ?Charges:  $Gait Training: 8-22 mins ?$Therapeutic Exercise: 8-22 mins          ?          ? ?Kaitlyn Kelly. PTA ?Acute Rehabilitation Services ?Office: [phone] ? ? ? ?Kaitlyn Kelly ?09/15/2021, 9:58 AM ? ?

## 2021-09-15 NOTE — Anesthesia Postprocedure Evaluation (Signed)
Anesthesia Post Note ? ?Patient: Kaitlyn Kelly ? ?Procedure(s) Performed: LEFT TOTAL KNEE ARTHROPLASTY (Left: Knee) ? ?  ? ?Patient location during evaluation: PACU ?Anesthesia Type: MAC and Spinal ?Level of consciousness: awake and alert ?Pain management: pain level controlled ?Vital Signs Assessment: post-procedure vital signs reviewed and stable ?Respiratory status: spontaneous breathing and respiratory function stable ?Cardiovascular status: blood pressure returned to baseline and stable ?Postop Assessment: spinal receding ?Anesthetic complications: no ? ? ?No notable events documented. ? ?Last Vitals:  ?Vitals:  ? 09/15/21 0012 09/15/21 0411  ?BP: 117/69 (!) 100/59  ?Pulse: 84 90  ?Resp: 20 20  ?Temp: 36.4 ?C 36.7 ?C  ?SpO2: 100% 99%  ?  ?Last Pain:  ?Vitals:  ? 09/15/21 0659  ?TempSrc:   ?PainSc: 4   ? ?Pain Goal: Patients Stated Pain Goal: 4 (09/15/21 0615) ? ?  ?  ?  ?  ?  ?  ?  ? ?[name] ? ? ? ? ?

## 2021-09-16 ENCOUNTER — Encounter (HOSPITAL_COMMUNITY): Payer: Self-pay | Admitting: Orthopedic Surgery

## 2021-09-16 ENCOUNTER — Other Ambulatory Visit (HOSPITAL_COMMUNITY): Payer: Self-pay

## 2021-09-16 LAB — CBC
HCT: 35.5 % — ABNORMAL LOW (ref 36.0–46.0)
Hemoglobin: 11.6 g/dL — ABNORMAL LOW (ref 12.0–15.0)
MCH: 26.8 pg (ref 26.0–34.0)
MCHC: 32.7 g/dL (ref 30.0–36.0)
MCV: 82 fL (ref 80.0–100.0)
Platelets: 273 10*3/uL (ref 150–400)
RBC: 4.33 MIL/uL (ref 3.87–5.11)
RDW: 14.3 % (ref 11.5–15.5)
WBC: 13.6 10*3/uL — ABNORMAL HIGH (ref 4.0–10.5)
nRBC: 0 % (ref 0.0–0.2)

## 2021-09-16 MED ORDER — OXYCODONE HCL 5 MG PO TABS
5.0000 mg | ORAL_TABLET | ORAL | 0 refills | Status: DC | PRN
  Filled 2021-09-16: qty 40, 4d supply, fill #0

## 2021-09-16 MED ORDER — ASPIRIN 81 MG PO CHEW
81.0000 mg | CHEWABLE_TABLET | Freq: Two times a day (BID) | ORAL | 0 refills | Status: AC
  Filled 2021-09-16: qty 60, 30d supply, fill #0

## 2021-09-16 MED ORDER — ACETAMINOPHEN 325 MG PO TABS
325.0000 mg | ORAL_TABLET | Freq: Four times a day (QID) | ORAL | 0 refills | Status: DC | PRN
  Filled 2021-09-16: qty 40, 5d supply, fill #0

## 2021-09-16 MED ORDER — IBUPROFEN 800 MG PO TABS
800.0000 mg | ORAL_TABLET | Freq: Two times a day (BID) | ORAL | 0 refills | Status: DC | PRN
  Filled 2021-09-16: qty 60, 30d supply, fill #0

## 2021-09-16 NOTE — Progress Notes (Signed)
Physical Therapy Treatment ?Patient Details ?Name: Kaitlyn Kelly ?MRN: [id number] ?DOB: [birth year] ?Today's Date: 09/16/2021 ? ? ?History of Present Illness Pt is 61 yo female admitted 09/14/21 for L TKA.  Pt with hx including but not limited to OA, HTN, lumbar back sprain, and GERD. ? ?  ?PT Comments  ? ? Pt with continues progress towards goals this session with increased tolerance for gait training distance and increased tolerance for WB on LLE. PT with good recall of gait sequencing with light cues needed to maintain upright trunk and forward gaze with grossly min guard for safety needed. Answered all pt questions, reiterated education re; precautions, positioning, ice, HEP compliance, pt verbalized understanding. Anticipate safe pt discharge with family assistance once medically cleared, will follow acutely. Pt continues to benefit from skilled PT services to progress toward functional mobility goals.  ?  ?Recommendations for follow up therapy are one component of a multi-disciplinary discharge planning process, led by the attending physician.  Recommendations may be updated based on patient status, additional functional criteria and insurance authorization. ? ?Follow Up Recommendations ? Follow physician's recommendations for discharge plan and follow up therapies ?  ?  ?Assistance Recommended at Discharge Intermittent Supervision/Assistance  ?Patient can return home with the following A little help with walking and/or transfers;A little help with bathing/dressing/bathroom;Help with stairs or ramp for entrance;Assistance with cooking/housework ?  ?Equipment Recommendations ? None recommended by PT  ?  ?Recommendations for Other Services   ? ? ?  ?Precautions / Restrictions Precautions ?Precautions: Fall ?Restrictions ?Weight Bearing Restrictions: Yes ?LLE Weight Bearing: Weight bearing as tolerated  ?  ? ?Mobility ? Bed Mobility ?Overal bed mobility: Modified Independent ?Bed Mobility: Supine to Sit ?  ?   ?Supine to sit: Modified independent (Device/Increase time) ?  ?  ?General bed mobility comments: increased time and use of strap ?  ? ?Transfers ?Overall transfer level: Needs assistance ?Equipment used: Rolling walker (2 wheels) ?Transfers: Sit to/from Stand ?Sit to Stand: Min guard ?  ?  ?  ?  ?  ?General transfer comment: light cues for hand placement with good pt recall ?  ? ?Ambulation/Gait ?Ambulation/Gait assistance: Min assist ?Gait Distance (Feet): 120 Feet ?Assistive device: Rolling walker (2 wheels) ?Gait Pattern/deviations: Step-to pattern, Decreased stride length, Decreased stance time - left, Decreased weight shift to left ?Gait velocity: decreased ?  ?  ?General Gait Details: slow stedy gait with good recall of sequencing ? ? ?Stairs ?  ?  ?  ?  ?  ? ? ?Wheelchair Mobility ?  ? ?Modified Rankin (Stroke Patients Only) ?  ? ? ?  ?Balance Overall balance assessment: Needs assistance ?Sitting-balance support: No upper extremity supported ?Sitting balance-Leahy Scale: Good ?  ?  ?Standing balance support: Bilateral upper extremity supported, Reliant on assistive device for balance ?Standing balance-Leahy Scale: Fair ?Standing balance comment: able to static standwithout UE support for peri-care and to wash hands ?  ?  ?  ?  ?  ?  ?  ?  ?  ?  ?  ?  ? ?  ?Cognition Arousal/Alertness: Awake/alert ?Behavior During Therapy: [facility] for tasks assessed/performed ?Overall Cognitive Status: Within Functional Limits for tasks assessed ?  ?  ?  ?  ?  ?  ?  ?  ?  ?  ?  ?  ?  ?  ?  ?  ?  ?  ?  ? ?  ?Exercises Total Joint Exercises ?Ankle Circles/Pumps: AROM, Both, 20 reps, Supine ?Knee  Flexion:  (with RLE over pressure) ? ?  ?General Comments   ?  ?  ? ?Pertinent Vitals/Pain Pain Assessment ?Pain Assessment: Faces ?Faces Pain Scale: Hurts little more ?Pain Location: L knee ?Pain Descriptors / Indicators: Discomfort, Sore ?Pain Intervention(s): Limited activity within patient's tolerance, Monitored during session,  Repositioned  ? ? ?Home Living   ?  ?  ?  ?  ?  ?  ?  ?  ?  ?   ?  ?Prior Function    ?  ?  ?   ? ?PT Goals (current goals can now be found in the care plan section) Acute Rehab PT Goals ?Patient Stated Goal: return home ?PT Goal Formulation: With patient/family ?Time For Goal Achievement: 09/28/21 ? ?  ?Frequency ? ? ? 7X/week ? ? ? ?  ?PT Plan    ? ? ?Co-evaluation   ?  ?  ?  ?  ? ?  ?AM-PAC PT "6 Clicks" Mobility   ?Outcome Measure ? Help needed turning from your back to your side while in a flat bed without using bedrails?: A Little ?Help needed moving from lying on your back to sitting on the side of a flat bed without using bedrails?: A Little ?Help needed moving to and from a bed to a chair (including a wheelchair)?: A Little ?Help needed standing up from a chair using your arms (e.g., wheelchair or bedside chair)?: A Little ?Help needed to walk in hospital room?: A Little ?Help needed climbing 3-5 steps with a railing? : A Lot ?6 Click Score: 17 ? ?  ?End of Session Equipment Utilized During Treatment: Gait belt ?Activity Tolerance: Patient tolerated treatment well ?Patient left: in chair;with call bell/phone within reach ?Nurse Communication: Mobility status;Other (comment) (pt requesting to shower before going home) ?PT Visit Diagnosis: Other abnormalities of gait and mobility (R26.89);Muscle weakness (generalized) (M62.81) ?  ? ? ?Time: 0017-4944 ?PT Time Calculation (min) (ACUTE ONLY): 25 min ? ?Charges:  $Gait Training: 23-37 mins          ?          ? ?Audry Riles. PTA ?Acute Rehabilitation Services ?Office: [phone] ? ? ? ?Betsey Holiday Teriana Danker ?09/16/2021, 9:49 AM ? ?

## 2021-09-16 NOTE — Plan of Care (Signed)

## 2021-09-16 NOTE — Progress Notes (Signed)
?  Subjective: ?Patient stable.  Pain slightly better controlled today than yesterday.  She has been walking in the halls as well as going up and down 1-2 stairs in the therapy room.  Currently taking oxycodone for pain and aspirin for DVT prophylaxis  ? ?Objective: ?Vital signs in last 24 hours: ?Temp:  [97.8 ?F (36.6 ?C)-98.9 ?F (37.2 ?C)] 97.8 ?F (36.6 ?C) (03/02 0428) ?Pulse Rate:  [100-104] 104 (03/02 0428) ?Resp:  [17-20] 17 (03/02 0428) ?BP: (108-125)/(62-81) 108/62 (03/02 0428) ?SpO2:  [91 %-100 %] 91 % (03/02 0428) ? ?Intake/Output from previous day: ?03/01 0701 - 03/02 0700 ?In: 980 [P.O.:480; IV Piggyback:500] ?Out: -  ?Intake/Output this shift: ?No intake/output data recorded. ? ?Exam: ? ?Sensation intact distally ?Dorsiflexion/Plantar flexion intact ? ?Labs: ?Recent Labs  ?  09/15/21 ?0429 09/16/21 ?0253  ?HGB 11.2* 11.6*  ? ?Recent Labs  ?  09/15/21 ?0429 09/16/21 ?0253  ?WBC 10.1 13.6*  ?RBC 4.23 4.33  ?HCT 35.8* 35.5*  ?PLT 290 273  ? ?Recent Labs  ?  09/15/21 ?0429  ?NA 137  ?K 3.3*  ?CL 100  ?CO2 26  ?BUN 13  ?CREATININE 1.03*  ?GLUCOSE 113*  ?CALCIUM 8.6*  ? ?No results for input(s): LABPT, INR in the last 72 hours. ? ?Assessment/Plan: ?Plan discharge today after physical therapy this morning.  Patient doing well.  Patient instructed in home use of CPM.  Follow-up with [place] in approximately 10 days.  Patient has appointment ? ? ?Anderson Malta ?09/16/2021, 7:24 AM  ? ? ?  ?

## 2021-09-21 ENCOUNTER — Other Ambulatory Visit (HOSPITAL_COMMUNITY): Payer: Self-pay

## 2021-09-23 ENCOUNTER — Other Ambulatory Visit (HOSPITAL_COMMUNITY): Payer: Self-pay

## 2021-09-23 ENCOUNTER — Encounter: Payer: Self-pay | Admitting: Registered Nurse

## 2021-09-23 ENCOUNTER — Other Ambulatory Visit: Payer: Self-pay | Admitting: Surgical

## 2021-09-23 MED ORDER — METHOCARBAMOL 500 MG PO TABS
500.0000 mg | ORAL_TABLET | Freq: Three times a day (TID) | ORAL | 0 refills | Status: DC | PRN
Start: 2021-09-23 — End: 2021-09-29
  Filled 2021-09-23: qty 30, 10d supply, fill #0

## 2021-09-26 NOTE — Discharge Summary (Cosign Needed)
Physician Discharge Summary      Patient ID: Kaitlyn Kelly MRN: [id number] DOB/AGE: 1961-06-03 62 y.o.  Admit date: 09/14/2021 Discharge date: 09/16/2021  Admission Diagnoses:  Principal Problem:   S/P TKR (total knee replacement), left   Discharge Diagnoses:  Same  Surgeries: Procedure(s): LEFT TOTAL KNEE ARTHROPLASTY on 09/14/2021   Consultants:   Discharged Condition: Stable  Hospital Course: Kaitlyn Kelly is an 61 y.o. female who was admitted 09/14/2021 with a chief complaint of left knee pain, and found to have a diagnosis of left knee osteoarthritis.  They were brought to the operating room on 09/14/2021 and underwent the above named procedures.  Pt awoke from anesthesia without complication and was transferred to the floor. On POD1, patient's pain was overall controlled but moderate.  She was only able to ambulate about 25 feet with physical therapy on POD 1.  This improved significantly on POD 2 when she was able to ambulate 120 feet.  She had no complaint chest pain or shortness of breath during her stay.  She was discharged home on POD 2..  Pt will f/u with Dr. Marlou Sa in clinic in ~2 weeks.   Antibiotics given:  Anti-infectives (From admission, onward)    Start     Dose/Rate Route Frequency Ordered Stop   09/14/21 1900  ceFAZolin (ANCEF) IVPB 2g/100 mL premix  Status:  Discontinued        2 g 200 mL/hr over 30 Minutes Intravenous Every 8 hours 09/14/21 1424 09/16/21 1947   09/14/21 1400  ceFAZolin (ANCEF) IVPB 2g/100 mL premix  Status:  Discontinued        2 g 200 mL/hr over 30 Minutes Intravenous Every 8 hours 09/14/21 1338 09/14/21 1424   09/14/21 1158  vancomycin (VANCOCIN) powder  Status:  Discontinued          As needed 09/14/21 1159 09/14/21 1321   09/14/21 0915  ceFAZolin (ANCEF) IVPB 2g/100 mL premix        2 g 200 mL/hr over 30 Minutes Intravenous On call to O.R. 09/14/21 0907 09/14/21 1045   09/14/21 0910  ceFAZolin (ANCEF) 2-4 GM/100ML-% IVPB       Note  to Pharmacy: Cameron Sprang M: cabinet override      09/14/21 0910 09/15/21 2030     .  Recent vital signs:  Vitals:   09/16/21 0428 09/16/21 0740  BP: 108/62 112/66  Pulse: (!) 104 99  Resp: 17 18  Temp: 97.8 F (36.6 C) 98.6 F (37 C)  SpO2: 91% 99%    Recent laboratory studies:  Results for orders placed or performed during the hospital encounter of 09/14/21  CBC  Result Value Ref Range   WBC 10.1 4.0 - 10.5 K/uL   RBC 4.23 3.87 - 5.11 MIL/uL   Hemoglobin 11.2 (L) 12.0 - 15.0 g/dL   HCT 35.8 (L) 36.0 - 46.0 %   MCV 84.6 80.0 - 100.0 fL   MCH 26.5 26.0 - 34.0 pg   MCHC 31.3 30.0 - 36.0 g/dL   RDW 14.4 11.5 - 15.5 %   Platelets 290 150 - 400 K/uL   nRBC 0.0 0.0 - 0.2 %  Basic metabolic panel  Result Value Ref Range   Sodium 137 135 - 145 mmol/L   Potassium 3.3 (L) 3.5 - 5.1 mmol/L   Chloride 100 98 - 111 mmol/L   CO2 26 22 - 32 mmol/L   Glucose, Bld 113 (H) 70 - 99 mg/dL   BUN 13 6 -  20 mg/dL   Creatinine, Ser 1.03 (H) 0.44 - 1.00 mg/dL   Calcium 8.6 (L) 8.9 - 10.3 mg/dL   GFR, Estimated >60 >60 mL/min   Anion gap 11 5 - 15  CBC  Result Value Ref Range   WBC 13.6 (H) 4.0 - 10.5 K/uL   RBC 4.33 3.87 - 5.11 MIL/uL   Hemoglobin 11.6 (L) 12.0 - 15.0 g/dL   HCT 35.5 (L) 36.0 - 46.0 %   MCV 82.0 80.0 - 100.0 fL   MCH 26.8 26.0 - 34.0 pg   MCHC 32.7 30.0 - 36.0 g/dL   RDW 14.3 11.5 - 15.5 %   Platelets 273 150 - 400 K/uL   nRBC 0.0 0.0 - 0.2 %    Discharge Medications:   Allergies as of 09/16/2021       Reactions   Shingrix [zoster Vac Recomb Adjuvanted]    LICHEN'S FLARE   Sulfonamide Derivatives Rash        Medication List     STOP taking these medications    nitrofurantoin (macrocrystal-monohydrate) 100 MG capsule Commonly known as: Macrobid   oxyCODONE-acetaminophen 5-325 MG tablet Commonly known as: Percocet       TAKE these medications    acetaminophen 325 MG tablet Commonly known as: TYLENOL Take 1-2 tablets (325-650 mg total) by  mouth every 6 (six) hours as needed for mild pain (pain score 1-3 or temp > 100.5).   albuterol 108 (90 Base) MCG/ACT inhaler Commonly known as: VENTOLIN HFA INHALE 2 PUFFS INTO THE LUNGS AS NEEDED EVERY 4 HOURS What changed:  how much to take when to take this reasons to take this   aspirin 81 MG chewable tablet Chew 1 tablet (81 mg total) by mouth 2 (two) times daily. What changed: when to take this   clobetasol cream 0.05 % Commonly known as: TEMOVATE Apply 1 application topically daily as needed (lichen sclerosus).   docusate sodium 100 MG capsule Commonly known as: COLACE Take 200 mg by mouth in the morning.   fluticasone-salmeterol 100-50 MCG/ACT Aepb Commonly known as: ADVAIR 1 puff 2 (two) times daily as needed (Asthma).   gabapentin 300 MG capsule Commonly known as: Neurontin Take 1 capsule (300 mg total) by mouth 3 (three) times daily.   ibuprofen 800 MG tablet Commonly known as: ADVIL Take 1 tablet (800 mg total) by mouth every 12 (twelve) hours as needed. What changed: when to take this   levothyroxine 100 MCG tablet Commonly known as: SYNTHROID Take 1 tablet (100 mcg total) by mouth daily before breakfast.   lisinopril-hydrochlorothiazide 20-25 MG tablet Commonly known as: ZESTORETIC TAKE 1 TABLET BY MOUTH DAILY.   oxyCODONE 5 MG immediate release tablet Commonly known as: Oxy IR/ROXICODONE Take 1-2 tablets (5-10 mg total) by mouth every 4 (four) hours as needed for moderate pain (pain score 4-6).        Diagnostic Studies: DG Knee 1-2 Views Left  Result Date: 09/14/2021 CLINICAL DATA:  Evaluate for missing needle EXAM: LEFT KNEE - 1-2 VIEW COMPARISON:  08/16/2021 FINDINGS: Postop change from left total knee arthroplasty. No underlying periprosthetic fracture or dislocation. No retained metallic foreign body identified to explain the missing surgical needle. IMPRESSION: No retained metallic foreign body identified to account for the missing surgical  needle. Results were called by telephone at the time of interpretation on 09/14/2021 at 1:22 pm to [place], and staff member Alyse Low verbally acknowledged these results. Electronically Signed   By: Queen Slough.D.  On: 09/14/2021 13:23    Disposition: Discharge disposition: 01-Home or Self Care       Discharge Instructions     Call MD / Call 911   Complete by: As directed    If you experience chest pain or shortness of breath, CALL 911 and be transported to the hospital emergency room.  If you develope a fever above 101 F, pus (white drainage) or increased drainage or redness at the wound, or calf pain, call your surgeon's office.   Constipation Prevention   Complete by: As directed    Drink plenty of fluids.  Prune juice may be helpful.  You may use a stool softener, such as Colace (over the counter) 100 mg twice a day.  Use MiraLax (over the counter) for constipation as needed.   Diet - low sodium heart healthy   Complete by: As directed    Discharge instructions   Complete by: As directed    Okay to shower dressing is waterproof CPM 1 and half hours 3 times a day minimum increasing the degrees daily Use the knee immobilizer for ambulation only until you can do 10 straight leg raises on your own   Face-to-face encounter (required for Medicare/Medicaid patients)   Complete by: As directed    I Anderson Malta certify that this patient is under my care and that I, or a nurse practitioner or physician's assistant working with me, had a face-to-face encounter that meets the physician face-to-face encounter requirements with this patient on 09/16/2021. The encounter with the patient was in whole, or in part for the following medical condition(s) which is the primary reason for home health care (List medical condition): Total knee replacement with diminished ambulatory ability   The encounter with the patient was in whole, or in part, for the following medical condition, which is  the primary reason for home health care: Left total knee replacement   I certify that, based on my findings, the following services are medically necessary home health services: Physical therapy   Reason for Medically Necessary Home Health Services: Therapy- Therapeutic Exercises to Increase Strength and Endurance   My clinical findings support the need for the above services: Pain interferes with ambulation/mobility   Further, I certify that my clinical findings support that this patient is homebound due to: Pain interferes with ambulation/mobility   Home Health   Complete by: As directed    To provide the following care/treatments: PT   Increase activity slowly as tolerated   Complete by: As directed    Post-operative opioid taper instructions:   Complete by: As directed    POST-OPERATIVE OPIOID TAPER INSTRUCTIONS: It is important to wean off of your opioid medication as soon as possible. If you do not need pain medication after your surgery it is ok to stop day one. Opioids include: Codeine, Hydrocodone(Norco, Vicodin), Oxycodone(Percocet, oxycontin) and hydromorphone amongst others.  Long term and even short term use of opiods can cause: Increased pain response Dependence Constipation Depression Respiratory depression And more.  Withdrawal symptoms can include Flu like symptoms Nausea, vomiting And more Techniques to manage these symptoms Hydrate well Eat regular healthy meals Stay active Use relaxation techniques(deep breathing, meditating, yoga) Do Not substitute Alcohol to help with tapering If you have been on opioids for less than two weeks and do not have pain than it is ok to stop all together.  Plan to wean off of opioids This plan should start within one week post op of your joint replacement.  Maintain the same interval or time between taking each dose and first decrease the dose.  Cut the total daily intake of opioids by one tablet each day Next start to increase  the time between doses. The last dose that should be eliminated is the evening dose.             SignedDonella Stade 09/26/2021, 6:11 PM

## 2021-09-29 ENCOUNTER — Ambulatory Visit (INDEPENDENT_AMBULATORY_CARE_PROVIDER_SITE_OTHER): Payer: No Typology Code available for payment source | Admitting: Surgical

## 2021-09-29 ENCOUNTER — Ambulatory Visit (INDEPENDENT_AMBULATORY_CARE_PROVIDER_SITE_OTHER): Payer: No Typology Code available for payment source

## 2021-09-29 ENCOUNTER — Other Ambulatory Visit (HOSPITAL_COMMUNITY): Payer: Self-pay

## 2021-09-29 ENCOUNTER — Encounter: Payer: Self-pay | Admitting: Orthopedic Surgery

## 2021-09-29 ENCOUNTER — Other Ambulatory Visit: Payer: Self-pay

## 2021-09-29 DIAGNOSIS — Z96652 Presence of left artificial knee joint: Secondary | ICD-10-CM

## 2021-09-29 MED ORDER — METHOCARBAMOL 500 MG PO TABS
500.0000 mg | ORAL_TABLET | Freq: Three times a day (TID) | ORAL | 2 refills | Status: DC | PRN
  Filled 2021-09-29: qty 30, 10d supply, fill #0
  Filled 2021-10-17: qty 30, 10d supply, fill #1
  Filled 2021-10-28: qty 30, 10d supply, fill #2

## 2021-09-29 MED ORDER — METHOCARBAMOL 500 MG PO TABS
500.0000 mg | ORAL_TABLET | Freq: Three times a day (TID) | ORAL | 0 refills | Status: DC | PRN
  Filled 2021-09-29: qty 30, 10d supply, fill #0

## 2021-09-29 NOTE — Progress Notes (Signed)
? ?Post-Op Visit Note ?  ?Patient: Kaitlyn Kelly           ?Date of Birth: [birth year]           ?MRN: [id number] ?Visit Date: 09/29/2021 ?PCP: Maximiano Coss, NP ? ? ?Assessment & Plan: ? ?Chief Complaint:  ?Chief Complaint  ?Patient presents with  ? Left Knee - Routine Post Op  ?  S/p TKA  ? ?Visit Diagnoses:  ?1. S/P total knee arthroplasty, left   ? ? ?Plan: Patient is a 61 year old female who presents s/p left total knee arthroplasty on 09/14/2021.  She reports that her pain is overall controlled.  She is taking gabapentin, Robaxin.  Only has to rarely take oxycodone for pain control.  Up to 92 degrees on the CPM machine.  Insurance denied home health physical therapy so she has just been using a CPM machine and doing the physical therapy exercises that they taught her in the hospital.  She denies any fevers, chills, drainage.  No chest pain, shortness of breath, abdominal pain. ? ?On exam, she has range of motion from 0 degrees to 45 degrees of knee flexion.  She has a fairly firm endpoint at 45 degrees but her patella has good side-to-side mobility.  Incision looks to be healing well without evidence of infection or dehiscence.  2 sutures were removed and replaced with Steri-Strips.  No calf tenderness.  Negative Homans' sign.  She is able to perform straight leg raise weakly and is still using a gait belt to some extent.  Quad is firing. ? ?Left knee radiographs demonstrate left knee prosthesis in excellent position and alignment without any complicating features.  She has done a great job of getting her leg straight but unfortunately, she is struggling quite a bit with achieving flexion.  Plan for more urgent referral to physical therapy upstairs to work on left knee range of motion and strengthening; really want to focus on passive flexion and regaining her quad strength so she does not have to use the gait belt.  Plan for her to follow-up with the office in 2 weeks for clinical recheck primarily on her  range of motion and quad strength.  If she is still struggling, may need to consider manipulation under anesthesia at some point. ? ?Follow-Up Instructions: No follow-ups on file.  ? ?Orders:  ?Orders Placed This Encounter  ?Procedures  ? XR Knee 1-2 Views Left  ? Ambulatory referral to Physical Therapy  ? ?Meds ordered this encounter  ?Medications  ? DISCONTD: methocarbamol (ROBAXIN) 500 MG tablet  ?  Sig: Take 1 tablet (500 mg total) by mouth every 8 (eight) hours as needed.  ?  Dispense:  30 tablet  ?  Refill:  0  ? methocarbamol (ROBAXIN) 500 MG tablet  ?  Sig: Take 1 tablet (500 mg total) by mouth every 8 (eight) hours as needed.  ?  Dispense:  30 tablet  ?  Refill:  2  ? ? ?Imaging: ?No results found. ? ?PMFS History: ?Patient Active Problem List  ? Diagnosis Date Noted  ? S/P TKR (total knee replacement), left 09/14/2021  ? Acquired hypothyroidism 06/23/2021  ? Arthralgia of multiple joints 06/23/2021  ? Gastroesophageal reflux disease 06/23/2021  ? Localized morphea 06/23/2021  ? Lumbar back sprain 06/23/2021  ? Menopause 06/23/2021  ? Osteoarthritis 06/23/2021  ? Acute medial meniscus tear of right knee   ? Vulvar irritation 03/04/2020  ? Lichen sclerosus 09/38/1829  ? Pain in both hands 01/17/2020  ?  Vitamin D deficiency 05/20/2009  ? Allergic rhinitis 05/20/2009  ? ELEVATED BLOOD PRESSURE WITHOUT DIAGNOSIS OF HYPERTENSION 05/20/2009  ? HASHIMOTO'S THYROIDITIS 12/07/2007  ? Obesity 06/11/2007  ? Essential hypertension 06/11/2007  ? Exacerbation of asthma 06/11/2007  ? HEMATURIA, HX OF 06/11/2007  ? ?Past Medical History:  ?Diagnosis Date  ? Anal fissure   ? related to [place]  ? Arthritis   ? Asthma   ? Hemorrhoids   ? History of kidney stones   ? Hypertension   ? Hypothyroidism   ? Medical history non-contributory   ? Pneumonia   ? PONV (postoperative nausea and vomiting)   ? Thyroid disease   ?  ?Family History  ?Problem Relation Age of Onset  ? Heart disease Mother   ? Alcohol abuse Mother   ?  Asthma Mother   ? Depression Mother   ? Drug abuse Mother   ? Early death Mother   ? Hypertension Mother   ? Thyroid disease Sister   ? Diabetes Sister   ? Asthma Sister   ? Drug abuse Sister   ? Hypertension Sister   ? Psoriasis Sister   ? Interstitial cystitis Sister   ? Colon cancer Neg Hx   ? Esophageal cancer Neg Hx   ? Pancreatic cancer Neg Hx   ? Prostate cancer Neg Hx   ? Rectal cancer Neg Hx   ?  ?Past Surgical History:  ?Procedure Laterality Date  ? COLONOSCOPY    ? had a 2014, 2017  ? KNEE ARTHROSCOPY WITH MENISCAL REPAIR Right 03/30/2021  ? Procedure: right knee meniscal root tear repair; arthroscopy;  Surgeon: Meredith Pel, MD;  Location: [place];  Service: Orthopedics;  Laterality: Right;  ? TOTAL ABDOMINAL HYSTERECTOMY Left 08/30/2002  ? TAH/RSO/LO cystectomy for bleeding, bilateral cysts  ? TOTAL KNEE ARTHROPLASTY Left 09/14/2021  ? Procedure: LEFT TOTAL KNEE ARTHROPLASTY;  Surgeon: Meredith Pel, MD;  Location: [place];  Service: Orthopedics;  Laterality: Left;  ? TUBAL LIGATION    ? VULVA /PERINEUM BIOPSY  03/08/2020  ?    ? ?Social History  ? ?Occupational History  ? Not on file  ?Tobacco Use  ? Smoking status: Never  ? Smokeless tobacco: Never  ?Vaping Use  ? Vaping Use: Never used  ?Substance and Sexual Activity  ? Alcohol use: Yes  ?  Alcohol/week: 1.0 - 2.0 standard drink  ?  Types: 1 - 2 Glasses of wine per week  ?  Comment: socially  ? Drug use: No  ? Sexual activity: Yes  ? ? ? ?

## 2021-10-01 ENCOUNTER — Ambulatory Visit: Payer: No Typology Code available for payment source | Admitting: Physical Therapy

## 2021-10-01 ENCOUNTER — Encounter: Payer: Self-pay | Admitting: Physical Therapy

## 2021-10-01 ENCOUNTER — Other Ambulatory Visit (HOSPITAL_COMMUNITY): Payer: Self-pay

## 2021-10-01 ENCOUNTER — Other Ambulatory Visit: Payer: Self-pay

## 2021-10-01 DIAGNOSIS — M25662 Stiffness of left knee, not elsewhere classified: Secondary | ICD-10-CM | POA: Diagnosis not present

## 2021-10-01 DIAGNOSIS — M6281 Muscle weakness (generalized): Secondary | ICD-10-CM | POA: Diagnosis not present

## 2021-10-01 DIAGNOSIS — R600 Localized edema: Secondary | ICD-10-CM | POA: Diagnosis not present

## 2021-10-01 DIAGNOSIS — M25562 Pain in left knee: Secondary | ICD-10-CM | POA: Diagnosis not present

## 2021-10-01 NOTE — Therapy (Signed)
?OUTPATIENT PHYSICAL THERAPY LOWER EXTREMITY EVALUATION ? ? ?Patient Name: Kaitlyn Kelly ?MRN: [id number] ?DOB:Nov 21, 1960, 61 y.o., female ?Today's Date: 10/01/2021 ? ? PT End of Session - 10/01/21 1004   ? ? Visit Number 1   ? Number of Visits 16   ? Date for PT Re-Evaluation 11/26/21   ? Authorization Type [facility]   ? PT Start Time [phone]   ? PT Stop Time 1005   ? PT Time Calculation (min) 48 min   ? Activity Tolerance Patient tolerated treatment well   ? Behavior During Therapy [facility] for tasks assessed/performed   ? ?  ?  ? ?  ? ? ?Past Medical History:  ?Diagnosis Date  ? Anal fissure   ? related to [place]  ? Arthritis   ? Asthma   ? Hemorrhoids   ? History of kidney stones   ? Hypertension   ? Hypothyroidism   ? Medical history non-contributory   ? Pneumonia   ? PONV (postoperative nausea and vomiting)   ? Thyroid disease   ? ?Past Surgical History:  ?Procedure Laterality Date  ? COLONOSCOPY    ? had a 2014, 2017  ? KNEE ARTHROSCOPY WITH MENISCAL REPAIR Right 03/30/2021  ? Procedure: right knee meniscal root tear repair; arthroscopy;  Surgeon: Meredith Pel, MD;  Location: [place];  Service: Orthopedics;  Laterality: Right;  ? TOTAL ABDOMINAL HYSTERECTOMY Left 08/30/2002  ? TAH/RSO/LO cystectomy for bleeding, bilateral cysts  ? TOTAL KNEE ARTHROPLASTY Left 09/14/2021  ? Procedure: LEFT TOTAL KNEE ARTHROPLASTY;  Surgeon: Meredith Pel, MD;  Location: [place];  Service: Orthopedics;  Laterality: Left;  ? TUBAL LIGATION    ? VULVA /PERINEUM BIOPSY  03/08/2020  ?    ? ?Patient Active Problem List  ? Diagnosis Date Noted  ? S/P TKR (total knee replacement), left 09/14/2021  ? Acquired hypothyroidism 06/23/2021  ? Arthralgia of multiple joints 06/23/2021  ? Gastroesophageal reflux disease 06/23/2021  ? Localized morphea 06/23/2021  ? Lumbar back sprain 06/23/2021  ? Menopause 06/23/2021  ? Osteoarthritis 06/23/2021  ? Acute medial meniscus tear of right knee   ? Vulvar irritation 03/04/2020  ?  Lichen sclerosus 08/67/6195  ? Pain in both hands 01/17/2020  ? Vitamin D deficiency 05/20/2009  ? Allergic rhinitis 05/20/2009  ? ELEVATED BLOOD PRESSURE WITHOUT DIAGNOSIS OF HYPERTENSION 05/20/2009  ? HASHIMOTO'S THYROIDITIS 12/07/2007  ? Obesity 06/11/2007  ? Essential hypertension 06/11/2007  ? Exacerbation of asthma 06/11/2007  ? HEMATURIA, HX OF 06/11/2007  ? ? ?PCP: Maximiano Coss, NP ? ?REFERRING PROVIDER: Donella Stade, PA-C ? ?REFERRING DIAG: K93.267 (ICD-10-CM) - S/P total knee arthroplasty, left ? ?THERAPY DIAG:  ?Acute pain of left knee ? ?Stiffness of left knee, not elsewhere classified ? ?Muscle weakness (generalized) ? ?Localized edema ? ?ONSET DATE: 09/14/21 ? ?SUBJECTIVE:  ? ?SUBJECTIVE STATEMENT: ?She reports Lt knee pain and stiffness S/P TKA 09/14/21. She has CPM machine at home she has been using and has been working on stretches. She has mostly just been using [facility] unless she needs to carry something or walking in yard. ? ?PERTINENT HISTORY: ?PMH: Lt TKA 09/14/21, Rt knee scope with meniscal repair 03/30/21 ? ?PAIN:  ?Are you having pain? Yes: NPRS scale: 1/10 ?Pain location: Lt knee ?Pain description: pulling ?Aggravating factors: extending her knee, bending too much, prolonged sitting ?Relieving factors: slight knee flexion , ice helped in beginning but has not used any lately, movement ? ?PRECAUTIONS: None ? ?WEIGHT BEARING RESTRICTIONS No ? ?  FALLS:  ?Has patient fallen in last 6 months? No, Number of falls: 0 ? ?LIVING ENVIRONMENT: ?Lives with: lives with their family ?Stairs: Yes; 2 steps to enter, none inside ? ? ?OCCUPATION: Nurse for [employer] ? ?PLOF: Independent ? 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 ? ?OBJECTIVE:  ? ?DIAGNOSTIC FINDINGS:  ?XR 09/29/21 "Left total knee prosthesis in good position alignment without incompetent features.  No periprosthetic  ?fracture noted.  No evidence of loosening or any lucency between the bone and prosthesis. ? ?PATIENT SURVEYS:   ?10/01/21: FOTO 53% functional, Goal is 72% ? ?COGNITION: ? Overall cognitive status: Within functional limits for tasks assessed   ?  ?SENSATION: ?Light touch Appears intact ? ?MUSCLE LENGTH: ?Tight hamstrings and quads on Lt ? ? ?PALPATION: ?No significant warmth or redness with palpation, TTP in quads and hamstrings ? ?LE ROM: ? ? AROM Right ?10/01/2021 Left ?10/01/2021  ?Knee flexion  65  ?Knee extension  8  ? (Blank rows = not tested) ? ?LE MMT: ? ?MMT Right ?10/01/2021 Left ?10/01/2021  ?Hip flexion  3  ?Hip extension    ?Hip abduction  4  ?Hip adduction    ?Hip internal rotation    ?Hip external rotation    ?Knee flexion  3  ?Knee extension  2+  ?Ankle dorsiflexion    ?Ankle plantarflexion    ?Ankle inversion    ?Ankle eversion    ? (Blank rows = not tested) ? ? ?FUNCTIONAL TESTS:  ?10/01/21: 5 times sit to stand: 18.99  must use UE's ? ?GAIT: ?Distance walked: 100 ?Assistive device utilized: Single point cane ?Level of assistance: Modified independence ?Comments: Antalgic gait with decreased step length, stance time, and hip/knee flexion on Lt ? ? ? ?TODAY'S TREATMENT: ?10/01/21: Reviewed HEP listed below and performed 10 reps ea of quad sets, LAQ, AAROM heelslides in supine and siting. Supine hamstring stretch with strap 30 sec X2. Nu step L5 seat #9 for 5 min UE/LE. Manual therapy for PROM to tolerance Lt knee flexion and extension. ? ? ?PATIENT EDUCATION:  ?Education details: HEP,POC ?Person educated: Patient ?Education method: Explanation, Demonstration, Verbal cues, and Handouts ?Education comprehension: verbalized understanding, returned demonstration, and needs further education ? ? ?HOME EXERCISE PROGRAM: ?Access Code: [email or web address] ?URL: [email or web address] ?Date: 10/01/2021 ?Prepared by: Elsie Ra ? ?Exercises ?Supine Quad Set - 2 x daily - 6 x weekly - 2-3 sets - 10 reps - 5 sec hold ?Supine Hamstring Stretch with Strap - 2 x daily - 6 x weekly - 3 sets - 30 hold ?Supine Heel Slide with  Strap - 2 x daily - 6 x weekly - 2-3 sets - 10 reps - 5 hold ?Supine Knee Extension Mobilization with Weight - 2 x daily - 6 x weekly - 1 sets - 3 min hold ?Seated Long Arc Quad - 2 x daily - 6 x weekly - 2-3 sets - 10 reps - 3 hold ?Seated Knee Flexion Stretch - 2 x daily - 6 x weekly - 1-2 sets - 10 reps - 5 sec hold ? ? ? ? ?ASSESSMENT: ? ?CLINICAL IMPRESSION: Patient presents with signs and symptoms consistent with Lt knee pain, stiffness, edema, general LE weakness, and difficulty standing and walking S/P Lt TKA on 09/14/21. Patient will benefit from skilled PT to address below impairments and improve overall function. ? ?OBJECTIVE IMPAIRMENTS: decreased activity tolerance, difficulty walking, decreased balance, decreased endurance, decreased mobility, decreased ROM, decreased strength, impaired flexibility, impaired LE use, postural dysfunction, and pain. ? ?  ACTIVITY LIMITATIONS: bending, lifting, carry, locomotion, cleaning, community activity, driving, and or occupation ? ?PERSONAL FACTORS: OA, Lt TKA 09/14/21 ? ? ?REHAB POTENTIAL: Good ? ?CLINICAL DECISION MAKING: stable/uncomplicated ?EVALUATION COMPLEXITY: Low ? ? ? ?GOALS: ?Short term PT Goals Target date: 10/29/2021 ?Pt will be I and compliant with HEP. ?Baseline:  ?Goal status: New ?Pt will decrease pain by 25% overall with activity ?Baseline: ?Goal status: New ? ?Long term PT goals Target date: 11/26/2021 ?Pt will improve Lt knee ROM 0-110 deg to improve functional mobility ?Baseline:8-65 ?Goal status: New ?Pt will improve  hip/knee strength to at least 5-/5 MMT to improve functional strength ?Baseline: ?Goal status: New ?Pt will improve FOTO to at least 72% functional to show improved function ?Baseline: ?Goal status: New ?Pt will reduce pain to overall less than 3/10 with usual activity and work activity. ?Baseline: ?Goal status: New ?Pt will be able to ambulate community distances at least 1000 ft WNL gait pattern without complaints ?Baseline: ?Goal  status: New ? 6. Pt will improve 5 times sit to stand time to less than 13 seconds to show improved leg strength, endurance, and balance ? ?PLAN: ?PT FREQUENCY: 2-3 times per week  ? ?PT DURATION: 6-8 weeks ? ?PLAN

## 2021-10-04 ENCOUNTER — Ambulatory Visit (INDEPENDENT_AMBULATORY_CARE_PROVIDER_SITE_OTHER): Payer: No Typology Code available for payment source | Admitting: Physical Therapy

## 2021-10-04 ENCOUNTER — Encounter: Payer: Self-pay | Admitting: Physical Therapy

## 2021-10-04 ENCOUNTER — Other Ambulatory Visit: Payer: Self-pay

## 2021-10-04 DIAGNOSIS — M6281 Muscle weakness (generalized): Secondary | ICD-10-CM | POA: Diagnosis not present

## 2021-10-04 DIAGNOSIS — R600 Localized edema: Secondary | ICD-10-CM

## 2021-10-04 DIAGNOSIS — M25662 Stiffness of left knee, not elsewhere classified: Secondary | ICD-10-CM

## 2021-10-04 DIAGNOSIS — M25562 Pain in left knee: Secondary | ICD-10-CM

## 2021-10-04 NOTE — Therapy (Signed)
?OUTPATIENT PHYSICAL THERAPY TREATMENT NOTE ? ? ?Patient Name: Kaitlyn Kelly ?MRN: [id number] ?DOB:Nov 25, 1960, 61 y.o., female ?Today's Date: 10/04/2021 ? ?PCP: Maximiano Coss, NP ?REFERRING PROVIDER: Donella Stade, PA-C ? ? PT End of Session - 10/04/21 0807   ? ? Visit Number 2   ? Number of Visits 16   ? Date for PT Re-Evaluation 11/26/21   ? Authorization Type [facility]   ? PT Start Time [phone]   ? PT Stop Time 0855   ? PT Time Calculation (min) 52 min   ? Activity Tolerance Patient tolerated treatment well   ? Behavior During Therapy [facility] for tasks assessed/performed   ? ?  ?  ? ?  ? ? ?Past Medical History:  ?Diagnosis Date  ? Anal fissure   ? related to [place]  ? Arthritis   ? Asthma   ? Hemorrhoids   ? History of kidney stones   ? Hypertension   ? Hypothyroidism   ? Medical history non-contributory   ? Pneumonia   ? PONV (postoperative nausea and vomiting)   ? Thyroid disease   ? ?Past Surgical History:  ?Procedure Laterality Date  ? COLONOSCOPY    ? had a 2014, 2017  ? KNEE ARTHROSCOPY WITH MENISCAL REPAIR Right 03/30/2021  ? Procedure: right knee meniscal root tear repair; arthroscopy;  Surgeon: Meredith Pel, MD;  Location: [place];  Service: Orthopedics;  Laterality: Right;  ? TOTAL ABDOMINAL HYSTERECTOMY Left 08/30/2002  ? TAH/RSO/LO cystectomy for bleeding, bilateral cysts  ? TOTAL KNEE ARTHROPLASTY Left 09/14/2021  ? Procedure: LEFT TOTAL KNEE ARTHROPLASTY;  Surgeon: Meredith Pel, MD;  Location: [place];  Service: Orthopedics;  Laterality: Left;  ? TUBAL LIGATION    ? VULVA /PERINEUM BIOPSY  03/08/2020  ?    ? ?Patient Active Problem List  ? Diagnosis Date Noted  ? S/P TKR (total knee replacement), left 09/14/2021  ? Acquired hypothyroidism 06/23/2021  ? Arthralgia of multiple joints 06/23/2021  ? Gastroesophageal reflux disease 06/23/2021  ? Localized morphea 06/23/2021  ? Lumbar back sprain 06/23/2021  ? Menopause 06/23/2021  ? Osteoarthritis 06/23/2021  ? Acute medial  meniscus tear of right knee   ? Vulvar irritation 03/04/2020  ? Lichen sclerosus 16/01/3709  ? Pain in both hands 01/17/2020  ? Vitamin D deficiency 05/20/2009  ? Allergic rhinitis 05/20/2009  ? ELEVATED BLOOD PRESSURE WITHOUT DIAGNOSIS OF HYPERTENSION 05/20/2009  ? HASHIMOTO'S THYROIDITIS 12/07/2007  ? Obesity 06/11/2007  ? Essential hypertension 06/11/2007  ? Exacerbation of asthma 06/11/2007  ? HEMATURIA, HX OF 06/11/2007  ? ? ?REFERRING DIAG: G26.948 (ICD-10-CM) - S/P total knee arthroplasty, left ? ?THERAPY DIAG:  ?Acute pain of left knee ? ?Stiffness of left knee, not elsewhere classified ? ?Muscle weakness (generalized) ? ?Localized edema ? ?PERTINENT HISTORY: PMH: Lt TKA 09/14/21, Rt knee scope with meniscal repair 03/30/21 ? ?ONSET DATE: 09/14/21 ? ?PRECAUTIONS: None ? ?SUBJECTIVE: She is still using CPM 4-5 hours / day and is doing HEP.  Yesterday she was able to do SLRs 2 reps 3 sets. She went out in yard some with cane and her leg was sore a little more.  ? ?PAIN:  ?Are you having pain? Yes: NPRS scale: 6/10  since PT eval  lowest 0/10  &  highest 7/10 ?Pain location: knee more in quads distally and hamstrings ?Pain description: intermittent  tight pull & sore, some spasms ?Aggravating factors: moving knee after it has gotten stiff ?Relieving factors: straightening knee ? ? ?OBJECTIVE:  ?  ?  DIAGNOSTIC FINDINGS:  ?XR 09/29/21 "Left total knee prosthesis in good position alignment without incompetent features.  No periprosthetic  ?fracture noted.  No evidence of loosening or any lucency between the bone and prosthesis. ?  ?PATIENT SURVEYS:  ?10/01/21: FOTO 53% functional, Goal is 72% ?  ?MUSCLE LENGTH: ?10/01/2021: Tight hamstrings and quads on Lt ?  ?  ?PALPATION: ?No significant warmth or redness with palpation, TTP in quads and hamstrings ?  ?LE ROM: ?  ? AROM Right ?10/01/2021 Left ?10/01/2021  ?Knee flexion   65  ?Knee extension   8  ? (Blank rows = not tested) ?  ?LE MMT: ?  ?MMT Right ?10/01/2021  Left ?10/01/2021  ?Hip flexion   3  ?Hip extension      ?Hip abduction   4  ?Hip adduction      ?Hip internal rotation      ?Hip external rotation      ?Knee flexion   3  ?Knee extension   2+  ?Ankle dorsiflexion      ?Ankle plantarflexion      ?Ankle inversion      ?Ankle eversion      ? (Blank rows = not tested) ?  ?  ?FUNCTIONAL TESTS:  ?10/01/21: 5 times sit to stand: 18.99  must use UE's ?  ?GAIT: ?Distance walked: 100 ?Assistive device utilized: Single point cane ?Level of assistance: Modified independence ?Comments: Antalgic gait with decreased step length, stance time, and hip/knee flexion on Lt ?  ?  ?  ?TODAY'S TREATMENT: ?10/04/2021 ?Therapeutic Exercise: ?Aerobic: Nustep seat 8 level 5 with BLEs & BUEs 10 min ? ? ?Access Code: [email or web address] ?Updated & reviewed ? ?Exercises ?Supine Quad Set towel roll at ankle - 2 x daily - 7 x weekly - 2-3 sets - 10 reps - 5 sec hold ?Supine Hamstring Stretch with Strap - 2 x daily - 7 x weekly - 3 sets - 30 hold ?Supine Heel Slide with Strap (added ball under ankle)  - 2 x daily - 7 x weekly - 2-3 sets - 10 reps - 5 hold ?Supine Knee Extension Mobilization with Weight - 2 x daily - 7 x weekly - 1 sets - 3 min hold ?Seated Long Arc Quad - 2 x daily - 7 x weekly - 2-3 sets - 10 reps - 3 hold ?Seated Knee Flexion Stretch - 2 x daily - 7 x weekly - 1-2 sets - 10 reps - 5 sec hold ?Added ?Seated Knee Flexion Extension AROM - 2 x daily - 7 x weekly - 1 sets - 10 reps - 5 seconds hold ?Seated Hamstring Stretch with Strap - 2 x daily - 7 x weekly - 1 sets - 2-3 reps - 30 seconds hold ? ?Neuromuscular Re-education: ?Manual Therapy: ?Modalities: Vaso left knee medium compression 34* for 10 min ? ?10/01/21: Reviewed HEP listed below and performed 10 reps ea of quad sets, LAQ, AAROM heelslides in supine and siting. Supine hamstring stretch with strap 30 sec X2. Nu step L5 seat #9 for 5 min UE/LE. Manual therapy for PROM to tolerance Lt knee flexion and extension. ?  ?  ?PATIENT EDUCATION:   ?Education details: HEP,POC ?Person educated: Patient ?Education method: Explanation, Demonstration, Verbal cues, and Handouts ?Education comprehension: verbalized understanding, returned demonstration, and needs further education ?  ?  ?HOME EXERCISE PROGRAM: ?Access Code: [email or web address] ?URL: [email or web address] ?Date: 10/01/2021 ?Prepared by: Elsie Ra ?  ?Exercises ?Supine Quad Set - 2 x daily - 6 x weekly -  2-3 sets - 10 reps - 5 sec hold ?Supine Hamstring Stretch with Strap - 2 x daily - 6 x weekly - 3 sets - 30 hold ?Supine Heel Slide with Strap - 2 x daily - 6 x weekly - 2-3 sets - 10 reps - 5 hold ?Supine Knee Extension Mobilization with Weight - 2 x daily - 6 x weekly - 1 sets - 3 min hold ?Seated Long Arc Quad - 2 x daily - 6 x weekly - 2-3 sets - 10 reps - 3 hold ?Seated Knee Flexion Stretch - 2 x daily - 6 x weekly - 1-2 sets - 10 reps - 5 sec hold ?  ?  ?  ?  ?ASSESSMENT: ?  ?CLINICAL IMPRESSION:  Patient has better understanding of HEP including 2 seated options for heel slide & hamstring stretch.   ?  ?OBJECTIVE IMPAIRMENTS: decreased activity tolerance, difficulty walking, decreased balance, decreased endurance, decreased mobility, decreased ROM, decreased strength, impaired flexibility, impaired LE use, postural dysfunction, and pain. ?  ?ACTIVITY LIMITATIONS: bending, lifting, carry, locomotion, cleaning, community activity, driving, and or occupation ?  ?PERSONAL FACTORS: OA, Lt TKA 09/14/21 ?  ?REHAB POTENTIAL: Good ?  ?CLINICAL DECISION MAKING: stable/uncomplicated ? ?EVALUATION COMPLEXITY: Low ?  ?GOALS: ?Short term PT Goals Target date: 10/29/2021 ?Pt will be I and compliant with HEP. ?Baseline:  ?Goal status: New ?Pt will decrease pain by 25% overall with activity ?Baseline: ?Goal status: New ?  ?Long term PT goals Target date: 11/26/2021 ?Pt will improve Lt knee ROM 0-110 deg to improve functional mobility ?Baseline:8-65 ?Goal status: New ?Pt will improve  hip/knee strength to at  least 5-/5 MMT to improve functional strength ?Baseline: ?Goal status: New ?Pt will improve FOTO to at least 72% functional to show improved function ?Baseline: ?Goal status: New ?Pt will reduce pain to overall less th

## 2021-10-08 ENCOUNTER — Ambulatory Visit: Payer: No Typology Code available for payment source | Admitting: Orthopedic Surgery

## 2021-10-11 ENCOUNTER — Ambulatory Visit (INDEPENDENT_AMBULATORY_CARE_PROVIDER_SITE_OTHER): Payer: No Typology Code available for payment source | Admitting: Physical Therapy

## 2021-10-11 ENCOUNTER — Encounter: Payer: Self-pay | Admitting: Physical Therapy

## 2021-10-11 ENCOUNTER — Other Ambulatory Visit: Payer: Self-pay

## 2021-10-11 DIAGNOSIS — M25662 Stiffness of left knee, not elsewhere classified: Secondary | ICD-10-CM | POA: Diagnosis not present

## 2021-10-11 DIAGNOSIS — M25562 Pain in left knee: Secondary | ICD-10-CM | POA: Diagnosis not present

## 2021-10-11 DIAGNOSIS — M6281 Muscle weakness (generalized): Secondary | ICD-10-CM | POA: Diagnosis not present

## 2021-10-11 DIAGNOSIS — R600 Localized edema: Secondary | ICD-10-CM | POA: Diagnosis not present

## 2021-10-11 NOTE — Therapy (Signed)
?OUTPATIENT PHYSICAL THERAPY TREATMENT NOTE ? ? ?Patient Name: Kaitlyn Kelly ?MRN: [id number] ?DOB:06/15/1961, 61 y.o., female ?Today's Date: 10/11/2021 ? ?PCP: Maximiano Coss, NP ?REFERRING PROVIDER: Donella Stade, PA-C ? ? PT End of Session - 10/11/21 0805   ? ? Visit Number 3   ? Number of Visits 16   ? Date for PT Re-Evaluation 11/26/21   ? Authorization Type [facility]   ? PT Start Time 0802   ? PT Stop Time 0855   ? PT Time Calculation (min) 53 min   ? Activity Tolerance Patient tolerated treatment well   ? Behavior During Therapy [facility] for tasks assessed/performed   ? ?  ?  ? ?  ? ? ? ?Past Medical History:  ?Diagnosis Date  ? Anal fissure   ? related to [place]  ? Arthritis   ? Asthma   ? Hemorrhoids   ? History of kidney stones   ? Hypertension   ? Hypothyroidism   ? Medical history non-contributory   ? Pneumonia   ? PONV (postoperative nausea and vomiting)   ? Thyroid disease   ? ?Past Surgical History:  ?Procedure Laterality Date  ? COLONOSCOPY    ? had a 2014, 2017  ? KNEE ARTHROSCOPY WITH MENISCAL REPAIR Right 03/30/2021  ? Procedure: right knee meniscal root tear repair; arthroscopy;  Surgeon: Meredith Pel, MD;  Location: [place];  Service: Orthopedics;  Laterality: Right;  ? TOTAL ABDOMINAL HYSTERECTOMY Left 08/30/2002  ? TAH/RSO/LO cystectomy for bleeding, bilateral cysts  ? TOTAL KNEE ARTHROPLASTY Left 09/14/2021  ? Procedure: LEFT TOTAL KNEE ARTHROPLASTY;  Surgeon: Meredith Pel, MD;  Location: [place];  Service: Orthopedics;  Laterality: Left;  ? TUBAL LIGATION    ? VULVA /PERINEUM BIOPSY  03/08/2020  ?    ? ?Patient Active Problem List  ? Diagnosis Date Noted  ? S/P TKR (total knee replacement), left 09/14/2021  ? Acquired hypothyroidism 06/23/2021  ? Arthralgia of multiple joints 06/23/2021  ? Gastroesophageal reflux disease 06/23/2021  ? Localized morphea 06/23/2021  ? Lumbar back sprain 06/23/2021  ? Menopause 06/23/2021  ? Osteoarthritis 06/23/2021  ? Acute medial  meniscus tear of right knee   ? Vulvar irritation 03/04/2020  ? Lichen sclerosus 08/65/7846  ? Pain in both hands 01/17/2020  ? Vitamin D deficiency 05/20/2009  ? Allergic rhinitis 05/20/2009  ? ELEVATED BLOOD PRESSURE WITHOUT DIAGNOSIS OF HYPERTENSION 05/20/2009  ? HASHIMOTO'S THYROIDITIS 12/07/2007  ? Obesity 06/11/2007  ? Essential hypertension 06/11/2007  ? Exacerbation of asthma 06/11/2007  ? HEMATURIA, HX OF 06/11/2007  ? ? ?REFERRING DIAG: N62.952 (ICD-10-CM) - S/P total knee arthroplasty, left ? ?THERAPY DIAG:  ?Acute pain of left knee ? ?Stiffness of left knee, not elsewhere classified ? ?Muscle weakness (generalized) ? ?Localized edema ? ?PERTINENT HISTORY: PMH: Lt TKA 09/14/21, Rt knee scope with meniscal repair 03/30/21 ? ?ONSET DATE: 09/14/21 ? ?PRECAUTIONS: None ? ?SUBJECTIVE: She is going back to work this morning. 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  She was able to get her LLE in SUV without strap.   ? ?PAIN:  ?Are you having pain? Yes: NPRS scale: 3/10  and in last week lowest 0/10  &  highest 9/10 ?Pain location: knee more in quads distally and hamstrings ?Pain description: intermittent  tight pull & sore, some spasms ?Aggravating factors: moving knee after it has gotten stiff ?Relieving factors: straightening  knee ? ? ?OBJECTIVE:  ?  ?DIAGNOSTIC FINDINGS:  ?XR 09/29/21 "Left total knee prosthesis in good position alignment without incompetent features.  No periprosthetic  ?fracture noted.  No evidence of loosening or any lucency between the bone and prosthesis. ?  ?PATIENT SURVEYS:  ?10/01/21: FOTO 53% functional, Goal is 72% ?  ?MUSCLE LENGTH: ?10/01/2021: Tight hamstrings and quads on Lt ?  ?  ?PALPATION: ?No significant warmth or redness with palpation, TTP in quads and hamstrings ?  ?LE ROM: ?  ? ROM Right ?10/01/21 Left ?10/01/21 Left ?10/11/21  ?Knee flexion   65 P: ?78*  ?Knee extension   8 P: -6*  ? (Blank  rows = not tested) ?  ?LE MMT: ?  ?MMT Right ?10/01/2021 Left ?10/01/2021  ?Hip flexion   3  ?Hip extension      ?Hip abduction   4  ?Hip adduction      ?Hip internal rotation      ?Hip external rotation      ?Knee flexion   3  ?Knee extension   2+  ?Ankle dorsiflexion      ?Ankle plantarflexion      ?Ankle inversion      ?Ankle eversion      ? (Blank rows = not tested) ?  ?  ?FUNCTIONAL TESTS:  ?10/01/21: 5 times sit to stand: 18.99  must use UE's ?  ?GAIT: ?Distance walked: 100 ?Assistive device utilized: Single point cane ?Level of assistance: Modified independence ?Comments: Antalgic gait with decreased step length, stance time, and hip/knee flexion on Lt ?  ?  ?  ?TODAY'S TREATMENT: ?10/11/2021 ?Therapeutic Exercise: ?Aerobic: Nustep seat 8 level 5 with BLEs & BUEs 10 min ?Step Up & Step Down 6" step BUE support 10 reps. PT demo & verbal cues on technique.  ?LAQ 15 reps with strap assist last degrees ? ?Gait:  PT demo & verbal cues on terminal stance knee flexion & initial contact with heel.  Pt ambulated 100' with cane safely carrying over instructions.  ? ?Neuromuscular Re-education: tandem stance on foam beam 30 sec LLE in front & in back.  ?Self-Care:  PT demo & instructed in scar mobs. Pt verbalized understanding. ? PT instructed with verbal cues on using pain increases to guide in use of assistive device.  ?Manual Therapy: PROM with end range stretch with gentle traction, tibial int rot and contract relax;  supine knee ext grade II mob with femoral int rot ?Modalities: Vaso left knee medium compression 34* for 10 min ? ?10/04/2021 ?Therapeutic Exercise: ?Aerobic: Nustep seat 8 level 5 with BLEs & BUEs 10 min ? ?Access Code: [email or web address] ?Updated & reviewed ? ?Exercises ?Supine Quad Set towel roll at ankle - 2 x daily - 7 x weekly - 2-3 sets - 10 reps - 5 sec hold ?Supine Hamstring Stretch with Strap - 2 x daily - 7 x weekly - 3 sets - 30 hold ?Supine Heel Slide with Strap (added ball under ankle)  - 2 x daily - 7  x weekly - 2-3 sets - 10 reps - 5 hold ?Supine Knee Extension Mobilization with Weight - 2 x daily - 7 x weekly - 1 sets - 3 min hold ?Seated Long Arc Quad - 2 x daily - 7 x weekly - 2-3 sets - 10 reps - 3 hold ?Seated Knee Flexion Stretch - 2 x daily - 7 x weekly - 1-2 sets - 10 reps - 5 sec hold ?Added ?Seated Knee Flexion Extension AROM -  2 x daily - 7 x weekly - 1 sets - 10 reps - 5 seconds hold ?Seated Hamstring Stretch with Strap - 2 x daily - 7 x weekly - 1 sets - 2-3 reps - 30 seconds hold ? ?Neuromuscular Re-education: ?Manual Therapy: ?Modalities: Vaso left knee medium compression 34* for 10 min ? ?10/01/21: Reviewed HEP listed below and performed 10 reps ea of quad sets, LAQ, AAROM heelslides in supine and siting. Supine hamstring stretch with strap 30 sec X2. Nu step L5 seat #9 for 5 min UE/LE. Manual therapy for PROM to tolerance Lt knee flexion and extension. ?  ?  ?PATIENT EDUCATION:  ?Education details: HEP,POC ?Person educated: Patient ?Education method: Explanation, Demonstration, Verbal cues, and Handouts ?Education comprehension: verbalized understanding, returned demonstration, and needs further education ?  ?  ?HOME EXERCISE PROGRAM: ?Access Code: [email or web address] ?URL: [email or web address] ?Date: 10/01/2021 ?Prepared by: Elsie Ra ?  ?Exercises ?Supine Quad Set - 2 x daily - 6 x weekly - 2-3 sets - 10 reps - 5 sec hold ?Supine Hamstring Stretch with Strap - 2 x daily - 6 x weekly - 3 sets - 30 hold ?Supine Heel Slide with Strap - 2 x daily - 6 x weekly - 2-3 sets - 10 reps - 5 hold ?Supine Knee Extension Mobilization with Weight - 2 x daily - 6 x weekly - 1 sets - 3 min hold ?Seated Long Arc Quad - 2 x daily - 6 x weekly - 2-3 sets - 10 reps - 3 hold ?Seated Knee Flexion Stretch - 2 x daily - 6 x weekly - 1-2 sets - 10 reps - 5 sec hold ?  ?  ?  ?  ?ASSESSMENT: ?  ?CLINICAL IMPRESSION:  Patient improved step up & down with instruction which should carryover to stairs. She appears to  understand scar mobs and PT recommendations to guide assistive device use without causing significant increases in pain. ?  ?OBJECTIVE IMPAIRMENTS: decreased activity tolerance, difficulty walking, decreased

## 2021-10-12 ENCOUNTER — Encounter: Payer: No Typology Code available for payment source | Admitting: Rehabilitative and Restorative Service Providers"

## 2021-10-13 ENCOUNTER — Ambulatory Visit (INDEPENDENT_AMBULATORY_CARE_PROVIDER_SITE_OTHER): Payer: No Typology Code available for payment source | Admitting: Orthopedic Surgery

## 2021-10-13 ENCOUNTER — Other Ambulatory Visit: Payer: Self-pay

## 2021-10-13 DIAGNOSIS — Z96652 Presence of left artificial knee joint: Secondary | ICD-10-CM

## 2021-10-14 ENCOUNTER — Encounter: Payer: Self-pay | Admitting: Orthopedic Surgery

## 2021-10-14 NOTE — Progress Notes (Signed)
? ?Post-Op Visit Note ?  ?Patient: Kaitlyn Kelly           ?Date of Birth: June 14, 1961           ?MRN: [id number] ?Visit Date: 10/13/2021 ?PCP: Maximiano Coss, NP ? ? ?Assessment & Plan: ? ?Chief Complaint:  ?Chief Complaint  ?Patient presents with  ? Left Knee - Routine Post Op  ?  left total knee arthroplasty on 09/14/2021  ? ?Visit Diagnoses:  ?1. S/P total knee arthroplasty, left   ? ? ?Plan: Patient presents now 5 weeks out left total knee replacement.  She is doing well.  Does have soreness.  Therapy has been going on 1 time a week for the past several weeks but starting next week she will go to twice a week.  She did go back to work on Monday.  In therapy they are doing balance and step ups.  She is able to get in and out of her SUV.  Uses a cane occasionally.  On examination the incision intact.  Range of motion 0-80.  Takes oxycodone at night.  Discontinue aspirin.  Negative Homans no calf tenderness today.  4-week return for clinical recheck on range of motion particularly flexion. ? ?Follow-Up Instructions: Return in about 4 weeks (around 11/10/2021).  ? ?Orders:  ?No orders of the defined types were placed in this encounter. ? ?No orders of the defined types were placed in this encounter. ? ? ?Imaging: ?No results found. ? ?PMFS History: ?Patient Active Problem List  ? Diagnosis Date Noted  ? S/P TKR (total knee replacement), left 09/14/2021  ? Acquired hypothyroidism 06/23/2021  ? Arthralgia of multiple joints 06/23/2021  ? Gastroesophageal reflux disease 06/23/2021  ? Localized morphea 06/23/2021  ? Lumbar back sprain 06/23/2021  ? Menopause 06/23/2021  ? Osteoarthritis 06/23/2021  ? Acute medial meniscus tear of right knee   ? Vulvar irritation 03/04/2020  ? Lichen sclerosus 53/66/4403  ? Pain in both hands 01/17/2020  ? Vitamin D deficiency 05/20/2009  ? Allergic rhinitis 05/20/2009  ? ELEVATED BLOOD PRESSURE WITHOUT DIAGNOSIS OF HYPERTENSION 05/20/2009  ? HASHIMOTO'S THYROIDITIS 12/07/2007  ?  Obesity 06/11/2007  ? Essential hypertension 06/11/2007  ? Exacerbation of asthma 06/11/2007  ? HEMATURIA, HX OF 06/11/2007  ? ?Past Medical History:  ?Diagnosis Date  ? Anal fissure   ? related to [place]  ? Arthritis   ? Asthma   ? Hemorrhoids   ? History of kidney stones   ? Hypertension   ? Hypothyroidism   ? Medical history non-contributory   ? Pneumonia   ? PONV (postoperative nausea and vomiting)   ? Thyroid disease   ?  ?Family History  ?Problem Relation Age of Onset  ? Heart disease Mother   ? Alcohol abuse Mother   ? Asthma Mother   ? Depression Mother   ? Drug abuse Mother   ? Early death Mother   ? Hypertension Mother   ? Thyroid disease Sister   ? Diabetes Sister   ? Asthma Sister   ? Drug abuse Sister   ? Hypertension Sister   ? Psoriasis Sister   ? Interstitial cystitis Sister   ? Colon cancer Neg Hx   ? Esophageal cancer Neg Hx   ? Pancreatic cancer Neg Hx   ? Prostate cancer Neg Hx   ? Rectal cancer Neg Hx   ?  ?Past Surgical History:  ?Procedure Laterality Date  ? COLONOSCOPY    ? had a 2014, 2017  ?  KNEE ARTHROSCOPY WITH MENISCAL REPAIR Right 03/30/2021  ? Procedure: right knee meniscal root tear repair; arthroscopy;  Surgeon: Meredith Pel, MD;  Location: [place];  Service: Orthopedics;  Laterality: Right;  ? TOTAL ABDOMINAL HYSTERECTOMY Left 08/30/2002  ? TAH/RSO/LO cystectomy for bleeding, bilateral cysts  ? TOTAL KNEE ARTHROPLASTY Left 09/14/2021  ? Procedure: LEFT TOTAL KNEE ARTHROPLASTY;  Surgeon: Meredith Pel, MD;  Location: [place];  Service: Orthopedics;  Laterality: Left;  ? TUBAL LIGATION    ? VULVA /PERINEUM BIOPSY  03/08/2020  ?    ? ?Social History  ? ?Occupational History  ? Not on file  ?Tobacco Use  ? Smoking status: Never  ? Smokeless tobacco: Never  ?Vaping Use  ? Vaping Use: Never used  ?Substance and Sexual Activity  ? Alcohol use: Yes  ?  Alcohol/week: 1.0 - 2.0 standard drink  ?  Types: 1 - 2 Glasses of wine per week  ?  Comment: socially  ? Drug use: No  ?  Sexual activity: Yes  ? ? ? ?

## 2021-10-18 ENCOUNTER — Encounter: Payer: Self-pay | Admitting: Physical Therapy

## 2021-10-18 ENCOUNTER — Encounter: Payer: No Typology Code available for payment source | Admitting: Physical Therapy

## 2021-10-18 ENCOUNTER — Other Ambulatory Visit (HOSPITAL_COMMUNITY): Payer: Self-pay

## 2021-10-18 ENCOUNTER — Ambulatory Visit (INDEPENDENT_AMBULATORY_CARE_PROVIDER_SITE_OTHER): Payer: No Typology Code available for payment source | Admitting: Physical Therapy

## 2021-10-18 DIAGNOSIS — M6281 Muscle weakness (generalized): Secondary | ICD-10-CM | POA: Diagnosis not present

## 2021-10-18 DIAGNOSIS — M25562 Pain in left knee: Secondary | ICD-10-CM | POA: Diagnosis not present

## 2021-10-18 DIAGNOSIS — R600 Localized edema: Secondary | ICD-10-CM | POA: Diagnosis not present

## 2021-10-18 DIAGNOSIS — M25662 Stiffness of left knee, not elsewhere classified: Secondary | ICD-10-CM | POA: Diagnosis not present

## 2021-10-18 NOTE — Therapy (Addendum)
?OUTPATIENT PHYSICAL THERAPY TREATMENT NOTE ? ? ?Patient Name: Kaitlyn Kelly ?MRN: [id number] ?DOB:01-15-1961, 61 y.o., female ?Today's Date: 10/18/2021 ? ?PCP: Maximiano Coss, NP ?REFERRING PROVIDER: Maximiano Coss, NP ? ? PT End of Session - 10/18/21 4098   ? ? Visit Number 4   ? Number of Visits 16   ? Date for PT Re-Evaluation 11/26/21   ? Authorization Type [facility]   ? PT Start Time 0800   ? PT Stop Time 0850   ? PT Time Calculation (min) 50 min   ? Activity Tolerance Patient tolerated treatment well   ? Behavior During Therapy [facility] for tasks assessed/performed   ? ?  ?  ? ?  ? ? ? ?Past Medical History:  ?Diagnosis Date  ? Anal fissure   ? related to [place]  ? Arthritis   ? Asthma   ? Hemorrhoids   ? History of kidney stones   ? Hypertension   ? Hypothyroidism   ? Medical history non-contributory   ? Pneumonia   ? PONV (postoperative nausea and vomiting)   ? Thyroid disease   ? ?Past Surgical History:  ?Procedure Laterality Date  ? COLONOSCOPY    ? had a 2014, 2017  ? KNEE ARTHROSCOPY WITH MENISCAL REPAIR Right 03/30/2021  ? Procedure: right knee meniscal root tear repair; arthroscopy;  Surgeon: Meredith Pel, MD;  Location: [place];  Service: Orthopedics;  Laterality: Right;  ? TOTAL ABDOMINAL HYSTERECTOMY Left 08/30/2002  ? TAH/RSO/LO cystectomy for bleeding, bilateral cysts  ? TOTAL KNEE ARTHROPLASTY Left 09/14/2021  ? Procedure: LEFT TOTAL KNEE ARTHROPLASTY;  Surgeon: Meredith Pel, MD;  Location: [place];  Service: Orthopedics;  Laterality: Left;  ? TUBAL LIGATION    ? VULVA /PERINEUM BIOPSY  03/08/2020  ?    ? ?Patient Active Problem List  ? Diagnosis Date Noted  ? S/P TKR (total knee replacement), left 09/14/2021  ? Acquired hypothyroidism 06/23/2021  ? Arthralgia of multiple joints 06/23/2021  ? Gastroesophageal reflux disease 06/23/2021  ? Localized morphea 06/23/2021  ? Lumbar back sprain 06/23/2021  ? Menopause 06/23/2021  ? Osteoarthritis 06/23/2021  ? Acute medial meniscus  tear of right knee   ? Vulvar irritation 03/04/2020  ? Lichen sclerosus 11/91/4782  ? Pain in both hands 01/17/2020  ? Vitamin D deficiency 05/20/2009  ? Allergic rhinitis 05/20/2009  ? ELEVATED BLOOD PRESSURE WITHOUT DIAGNOSIS OF HYPERTENSION 05/20/2009  ? HASHIMOTO'S THYROIDITIS 12/07/2007  ? Obesity 06/11/2007  ? Essential hypertension 06/11/2007  ? Exacerbation of asthma 06/11/2007  ? HEMATURIA, HX OF 06/11/2007  ? ? ?REFERRING DIAG: N56.213 (ICD-10-CM) - S/P total knee arthroplasty, left ? ?THERAPY DIAG:  ?Acute pain of left knee ? ?Stiffness of left knee, not elsewhere classified ? ?Muscle weakness (generalized) ? ?Localized edema ? ?PERTINENT HISTORY: PMH: Lt TKA 09/14/21, Rt knee scope with meniscal repair 03/30/21 ? ?ONSET DATE: 09/14/21 ? ?PRECAUTIONS: None ? ?SUBJECTIVE: She says she has been working on her knee with ROM, stairs, seated bike at home.  ? ?PAIN:  ?Are you having pain? Yes: NPRS scale: 3s/10 ?Pain location: knee more in quads distally and hamstrings ?Pain description: intermittent  tight pull & sore, some spasms ?Aggravating factors: moving knee after it has gotten stiff ?Relieving factors: straightening knee ? ? ?OBJECTIVE:  ?  ?DIAGNOSTIC FINDINGS:  ?XR 09/29/21 "Left total knee prosthesis in good position alignment without incompetent features.  No periprosthetic  ?fracture noted.  No evidence of loosening or any lucency between the bone and  prosthesis. ?  ?PATIENT SURVEYS:  ?10/01/21: FOTO 53% functional, Goal is 72% ?  ?MUSCLE LENGTH: ?10/01/2021: Tight hamstrings and quads on Lt ?  ?  ?PALPATION: ?No significant warmth or redness with palpation, TTP in quads and hamstrings ?  ?LE ROM: ?  ? ROM Left ?10/01/21 Left ?10/11/21 Left ?10/18/21  ?Knee flexion 65 P: ?78* A:82 ?P:85  ?Knee extension 8 P: -6* A:-2  ? (Blank rows = not tested) ?  ?LE MMT: ?  ?MMT Right ?10/01/2021 Left ?10/01/2021  ?Hip flexion   3  ?Hip extension      ?Hip abduction   4  ?Hip adduction      ?Hip internal rotation      ?Hip  external rotation      ?Knee flexion   3  ?Knee extension   2+  ?Ankle dorsiflexion      ?Ankle plantarflexion      ?Ankle inversion      ?Ankle eversion      ? (Blank rows = not tested) ?  ?  ?FUNCTIONAL TESTS:  ?10/01/21: 5 times sit to stand: 18.99  must use UE's ?  ?  ?  ?TODAY'S TREATMENT: ?10/18/2021 ?Therapeutic Exercise: ?Aerobic: bike rocking X 8 min for ROM ?Gastroc stretch 30 sec X 3 on slantboard ?Seated knee flexion AAROM tailgate stretch 10 sec X 3 min ?LAQ 3# 3X10 on Left ?Seated hamstring curls green 2X10 ? ?Gait:  She arrives with [facility] for community ambulation but able to ambulate independently in clinic without AD.  ?Manual Therapy: PROM with end range stretch, manual hamstring stretch and knee flexion mobs. ?Modalities: Vaso left knee medium compression 34* for 10 min ? ?10/11/2021 ?Therapeutic Exercise: ?Aerobic: Nustep seat 8 level 5 with BLEs & BUEs 10 min ?Step Up & Step Down 6" step BUE support 10 reps. PT demo & verbal cues on technique.  ?LAQ 15 reps with strap assist last degrees ? ?Gait:  PT demo & verbal cues on terminal stance knee flexion & initial contact with heel.  Pt ambulated 100' with cane safely carrying over instructions.  ? ?Neuromuscular Re-education: tandem stance on foam beam 30 sec LLE in front & in back.  ?Self-Care:  PT demo & instructed in scar mobs. Pt verbalized understanding. ? PT instructed with verbal cues on using pain increases to guide in use of assistive device.  ?Manual Therapy: PROM with end range stretch with gentle traction, tibial int rot and contract relax;  supine knee ext grade II mob with femoral int rot ?Modalities: Vaso left knee medium compression 34* for 10 min ? ?  ?PATIENT EDUCATION:  ?Education details: HEP,POC ?Person educated: Patient ?Education method: Explanation, Demonstration, Verbal cues, and Handouts ?Education comprehension: verbalized understanding, returned demonstration, and needs further education ?  ?  ?HOME EXERCISE PROGRAM: ?Access  Code: [email or web address] ?URL: [email or web address] ?Date: 10/01/2021 ?Prepared by: Elsie Ra ?  ?Exercises ?Supine Quad Set - 2 x daily - 6 x weekly - 2-3 sets - 10 reps - 5 sec hold ?Supine Hamstring Stretch with Strap - 2 x daily - 6 x weekly - 3 sets - 30 hold ?Supine Heel Slide with Strap - 2 x daily - 6 x weekly - 2-3 sets - 10 reps - 5 hold ?Supine Knee Extension Mobilization with Weight - 2 x daily - 6 x weekly - 1 sets - 3 min hold ?Seated Long Arc Quad - 2 x daily - 6 x weekly - 2-3 sets - 10 reps - 3 hold ?  Seated Knee Flexion Stretch - 2 x daily - 6 x weekly - 1-2 sets - 10 reps - 5 sec hold ?  ?  ?  ?  ?ASSESSMENT: ?  ?CLINICAL IMPRESSION:  She is overall progressing with gait and knee extension ROM, however knee flexion ROM is coming along more slowly which was the emphasis of today's session. ?  ?OBJECTIVE IMPAIRMENTS: decreased activity tolerance, difficulty walking, decreased balance, decreased endurance, decreased mobility, decreased ROM, decreased strength, impaired flexibility, impaired LE use, postural dysfunction, and pain. ?  ?ACTIVITY LIMITATIONS: bending, lifting, carry, locomotion, cleaning, community activity, driving, and or occupation ?  ?PERSONAL FACTORS: OA, Lt TKA 09/14/21 ?  ?REHAB POTENTIAL: Good ?  ?CLINICAL DECISION MAKING: stable/uncomplicated ? ?EVALUATION COMPLEXITY: Low ?  ?GOALS: ?Short term PT Goals Target date: 10/29/2021 ?Pt will be I and compliant with HEP. ?Baseline:  ?Goal status: Met ?Pt will decrease pain by 25% overall with activity ?Baseline: ?Goal status: Met ?  ?Long term PT goals Target date: 11/26/2021 ?Pt will improve Lt knee ROM 0-110 deg to improve functional mobility ?Baseline:see measurements ?Goal status: ongoing ?Pt will improve  hip/knee strength to at least 5-/5 MMT to improve functional strength ?Baseline: ?Goal status: ongoing ?Pt will improve FOTO to at least 72% functional to show improved function ?Baseline: ?Goal status: ongoing ?Pt will  reduce pain to overall less than 3/10 with usual activity and work activity. ?Baseline: ?Goal status: ongoing ?Pt will be able to ambulate community distances at least 1000 ft WNL gait pattern without complain

## 2021-10-20 ENCOUNTER — Encounter: Payer: Self-pay | Admitting: Physical Therapy

## 2021-10-20 ENCOUNTER — Ambulatory Visit (INDEPENDENT_AMBULATORY_CARE_PROVIDER_SITE_OTHER): Payer: No Typology Code available for payment source | Admitting: Physical Therapy

## 2021-10-20 DIAGNOSIS — M6281 Muscle weakness (generalized): Secondary | ICD-10-CM | POA: Diagnosis not present

## 2021-10-20 DIAGNOSIS — M25562 Pain in left knee: Secondary | ICD-10-CM

## 2021-10-20 DIAGNOSIS — M25662 Stiffness of left knee, not elsewhere classified: Secondary | ICD-10-CM

## 2021-10-20 DIAGNOSIS — R600 Localized edema: Secondary | ICD-10-CM

## 2021-10-20 NOTE — Therapy (Signed)
?OUTPATIENT PHYSICAL THERAPY TREATMENT NOTE ? ? ?Patient Name: Kaitlyn Kelly ?MRN: [id number] ?DOB:10/27/1960, 61 y.o., female ?Today's Date: 10/20/2021 ? ?PCP: Maximiano Coss, NP ?REFERRING PROVIDER: Donella Stade, PA-C ? ? PT End of Session - 10/20/21 0920   ? ? Visit Number 5   ? Number of Visits 16   ? Date for PT Re-Evaluation 11/26/21   ? Authorization Type [facility]   ? PT Start Time 0845   ? PT Stop Time 0930   ? PT Time Calculation (min) 45 min   ? Activity Tolerance Patient tolerated treatment well   ? Behavior During Therapy [facility] for tasks assessed/performed   ? ?  ?  ? ?  ? ? ? ? ?Past Medical History:  ?Diagnosis Date  ? Anal fissure   ? related to [place]  ? Arthritis   ? Asthma   ? Hemorrhoids   ? History of kidney stones   ? Hypertension   ? Hypothyroidism   ? Medical history non-contributory   ? Pneumonia   ? PONV (postoperative nausea and vomiting)   ? Thyroid disease   ? ?Past Surgical History:  ?Procedure Laterality Date  ? COLONOSCOPY    ? had a 2014, 2017  ? KNEE ARTHROSCOPY WITH MENISCAL REPAIR Right 03/30/2021  ? Procedure: right knee meniscal root tear repair; arthroscopy;  Surgeon: Meredith Pel, MD;  Location: [place];  Service: Orthopedics;  Laterality: Right;  ? TOTAL ABDOMINAL HYSTERECTOMY Left 08/30/2002  ? TAH/RSO/LO cystectomy for bleeding, bilateral cysts  ? TOTAL KNEE ARTHROPLASTY Left 09/14/2021  ? Procedure: LEFT TOTAL KNEE ARTHROPLASTY;  Surgeon: Meredith Pel, MD;  Location: [place];  Service: Orthopedics;  Laterality: Left;  ? TUBAL LIGATION    ? VULVA /PERINEUM BIOPSY  03/08/2020  ?    ? ?Patient Active Problem List  ? Diagnosis Date Noted  ? S/P TKR (total knee replacement), left 09/14/2021  ? Acquired hypothyroidism 06/23/2021  ? Arthralgia of multiple joints 06/23/2021  ? Gastroesophageal reflux disease 06/23/2021  ? Localized morphea 06/23/2021  ? Lumbar back sprain 06/23/2021  ? Menopause 06/23/2021  ? Osteoarthritis 06/23/2021  ? Acute medial  meniscus tear of right knee   ? Vulvar irritation 03/04/2020  ? Lichen sclerosus 71/12/2692  ? Pain in both hands 01/17/2020  ? Vitamin D deficiency 05/20/2009  ? Allergic rhinitis 05/20/2009  ? ELEVATED BLOOD PRESSURE WITHOUT DIAGNOSIS OF HYPERTENSION 05/20/2009  ? HASHIMOTO'S THYROIDITIS 12/07/2007  ? Obesity 06/11/2007  ? Essential hypertension 06/11/2007  ? Exacerbation of asthma 06/11/2007  ? HEMATURIA, HX OF 06/11/2007  ? ? ?REFERRING DIAG: W54.627 (ICD-10-CM) - S/P total knee arthroplasty, left ? ?THERAPY DIAG:  ?No diagnosis found. ? ?PERTINENT HISTORY: PMH: Lt TKA 09/14/21, Rt knee scope with meniscal repair 03/30/21 ? ?ONSET DATE: 09/14/21 ? ?PRECAUTIONS: None ? ?SUBJECTIVE: She says that she is not having pain at the moment but she is getting these sharp cramping pains in the anterior medial side of her knee that are severe when it happens but it does not happen all the time. ? ?PAIN:  ?Are you having pain? Yes: NPRS scale: 0-8/10 ?Pain location: knee more in quads distally and hamstrings ?Pain description: intermittent  tight pull & sore, some spasms ?Aggravating factors: moving knee after it has gotten stiff ?Relieving factors: straightening knee ? ? ?OBJECTIVE:  ?  ?DIAGNOSTIC FINDINGS:  ?XR 09/29/21 "Left total knee prosthesis in good position alignment without incompetent features.  No periprosthetic  ?fracture noted.  No evidence  of loosening or any lucency between the bone and prosthesis. ?  ?PATIENT SURVEYS:  ?10/01/21: FOTO 53% functional, Goal is 72% ?  ?MUSCLE LENGTH: ?10/01/2021: Tight hamstrings and quads on Lt ?  ?  ?PALPATION: ?No significant warmth or redness with palpation, TTP in quads and hamstrings ?  ?LE ROM: ?  ? ROM Left ?10/01/21 Left ?10/11/21 Left ?10/18/21  ?Knee flexion 65 P: ?78* A:82 ?P:85  ?Knee extension 8 P: -6* A:-2  ? (Blank rows = not tested) ?  ?LE MMT: ?  ?MMT Right ?10/01/2021 Left ?10/01/2021  ?Hip flexion   3  ?Hip extension      ?Hip abduction   4  ?Hip adduction      ?Hip  internal rotation      ?Hip external rotation      ?Knee flexion   3  ?Knee extension   2+  ?Ankle dorsiflexion      ?Ankle plantarflexion      ?Ankle inversion      ?Ankle eversion      ? (Blank rows = not tested) ?  ?  ?FUNCTIONAL TESTS:  ?10/01/21: 5 times sit to stand: 18.99  must use UE's ?  ?  ?  ?TODAY'S TREATMENT: ?10/20/2021 ?Therapeutic Exercise: ?Aerobic:Nu step X 8 min for ROM ?quad stretch 30 sec X 3 in supine with strap on Lt ?Seated knee flexion AAROM tailgate stretch 10 sec X 3 min ?LAQ 3# 3X10 on Left ?Sit to stands 2X10 no UE support from slightly elevated mat  ?TRX squats 2 sec hold X 10 ?Lunge stretch with Lt foot on 8 inch step 5 sec X10 ?  ?Manual Therapy: PROM with end range stretch, manual hamstring stretch and knee flexion mobs. ?Modalities: Vaso left knee medium compression 34* for 10 min ? ?10/18/2021 ?Therapeutic Exercise: ?Aerobic: bike rocking X 8 min for ROM ?Gastroc stretch 30 sec X 3 on slantboard ?Seated knee flexion AAROM tailgate stretch 10 sec X 3 min ?LAQ 3# 3X10 on Left ?Seated hamstring curls green 2X10 ? ?Gait:  She arrives with [facility] for community ambulation but able to ambulate independently in clinic without AD.  ?Manual Therapy: PROM with end range stretch, manual hamstring stretch and knee flexion mobs. ?Modalities: Vaso left knee medium compression 34* for 10 min ? ? ?  ?PATIENT EDUCATION:  ?Education details: HEP,POC ?Person educated: Patient ?Education method: Explanation, Demonstration, Verbal cues, and Handouts ?Education comprehension: verbalized understanding, returned demonstration, and needs further education ?  ?  ?HOME EXERCISE PROGRAM: ?Access Code: [email or web address] ?URL: [email or web address] ?Date: 10/01/2021 ?Prepared by: Elsie Ra ?  ?Exercises ?Supine Quad Set - 2 x daily - 6 x weekly - 2-3 sets - 10 reps - 5 sec hold ?Supine Hamstring Stretch with Strap - 2 x daily - 6 x weekly - 3 sets - 30 hold ?Supine Heel Slide with Strap - 2 x daily - 6 x weekly  - 2-3 sets - 10 reps - 5 hold ?Supine Knee Extension Mobilization with Weight - 2 x daily - 6 x weekly - 1 sets - 3 min hold ?Seated Long Arc Quad - 2 x daily - 6 x weekly - 2-3 sets - 10 reps - 3 hold ?Seated Knee Flexion Stretch - 2 x daily - 6 x weekly - 1-2 sets - 10 reps - 5 sec hold ?  ?  ?  ?  ?ASSESSMENT: ?  ?CLINICAL IMPRESSION:  We progressed her strength program with good overall tolerance, she does report some cramping  pain at times that may he her quads so we added stretch for this. We will continue to emphasize Lt knee flexion ROM as this is her biggest deficit at this time. ?  ?OBJECTIVE IMPAIRMENTS: decreased activity tolerance, difficulty walking, decreased balance, decreased endurance, decreased mobility, decreased ROM, decreased strength, impaired flexibility, impaired LE use, postural dysfunction, and pain. ?  ?ACTIVITY LIMITATIONS: bending, lifting, carry, locomotion, cleaning, community activity, driving, and or occupation ?  ?PERSONAL FACTORS: OA, Lt TKA 09/14/21 ?  ?REHAB POTENTIAL: Good ?  ?CLINICAL DECISION MAKING: stable/uncomplicated ? ?EVALUATION COMPLEXITY: Low ?  ?GOALS: ?Short term PT Goals Target date: 10/29/2021 ?Pt will be I and compliant with HEP. ?Baseline:  ?Goal status: Met ?Pt will decrease pain by 25% overall with activity ?Baseline: ?Goal status: Met ?  ?Long term PT goals Target date: 11/26/2021 ?Pt will improve Lt knee ROM 0-110 deg to improve functional mobility ?Baseline:see measurements ?Goal status: ongoing ?Pt will improve  hip/knee strength to at least 5-/5 MMT to improve functional strength ?Baseline: ?Goal status: ongoing ?Pt will improve FOTO to at least 72% functional to show improved function ?Baseline: ?Goal status: ongoing ?Pt will reduce pain to overall less than 3/10 with usual activity and work activity. ?Baseline: ?Goal status: ongoing ?Pt will be able to ambulate community distances at least 1000 ft WNL gait pattern without complaints ?Baseline: ?Goal  status: ongoing ?           6. Pt will improve 5 times sit to stand time to less than 13 seconds to show improved leg strength, endurance, and balance ?  ?PLAN: ?PT FREQUENCY: 2-3 times per week  ?  ?PT DURATION:

## 2021-10-27 ENCOUNTER — Ambulatory Visit (INDEPENDENT_AMBULATORY_CARE_PROVIDER_SITE_OTHER): Payer: No Typology Code available for payment source | Admitting: Physical Therapy

## 2021-10-27 ENCOUNTER — Encounter: Payer: Self-pay | Admitting: Physical Therapy

## 2021-10-27 ENCOUNTER — Other Ambulatory Visit: Payer: Self-pay

## 2021-10-27 DIAGNOSIS — R600 Localized edema: Secondary | ICD-10-CM | POA: Diagnosis not present

## 2021-10-27 DIAGNOSIS — M6281 Muscle weakness (generalized): Secondary | ICD-10-CM

## 2021-10-27 DIAGNOSIS — M25562 Pain in left knee: Secondary | ICD-10-CM

## 2021-10-27 DIAGNOSIS — M25662 Stiffness of left knee, not elsewhere classified: Secondary | ICD-10-CM

## 2021-10-27 NOTE — Therapy (Signed)
?OUTPATIENT PHYSICAL THERAPY TREATMENT NOTE ? ? ?Patient Name: Kaitlyn Kelly ?MRN: [id number] ?DOB:1961-02-28, 61 y.o., female ?Today's Date: 10/27/2021 ? ?PCP: Maximiano Coss, NP ?REFERRING PROVIDER: Donella Stade, PA-C ? ? PT End of Session - 10/27/21 0804   ? ? Visit Number 6   ? Number of Visits 16   ? Date for PT Re-Evaluation 11/26/21   ? Authorization Type [facility]   ? PT Start Time 0800   ? PT Stop Time 0855   ? PT Time Calculation (min) 55 min   ? Activity Tolerance Patient tolerated treatment well   ? Behavior During Therapy [facility] for tasks assessed/performed   ? ?  ?  ? ?  ? ? ? ? ? ?Past Medical History:  ?Diagnosis Date  ? Anal fissure   ? related to [place]  ? Arthritis   ? Asthma   ? Hemorrhoids   ? History of kidney stones   ? Hypertension   ? Hypothyroidism   ? Medical history non-contributory   ? Pneumonia   ? PONV (postoperative nausea and vomiting)   ? Thyroid disease   ? ?Past Surgical History:  ?Procedure Laterality Date  ? COLONOSCOPY    ? had a 2014, 2017  ? KNEE ARTHROSCOPY WITH MENISCAL REPAIR Right 03/30/2021  ? Procedure: right knee meniscal root tear repair; arthroscopy;  Surgeon: Meredith Pel, MD;  Location: [place];  Service: Orthopedics;  Laterality: Right;  ? TOTAL ABDOMINAL HYSTERECTOMY Left 08/30/2002  ? TAH/RSO/LO cystectomy for bleeding, bilateral cysts  ? TOTAL KNEE ARTHROPLASTY Left 09/14/2021  ? Procedure: LEFT TOTAL KNEE ARTHROPLASTY;  Surgeon: Meredith Pel, MD;  Location: [place];  Service: Orthopedics;  Laterality: Left;  ? TUBAL LIGATION    ? VULVA /PERINEUM BIOPSY  03/08/2020  ?    ? ?Patient Active Problem List  ? Diagnosis Date Noted  ? S/P TKR (total knee replacement), left 09/14/2021  ? Acquired hypothyroidism 06/23/2021  ? Arthralgia of multiple joints 06/23/2021  ? Gastroesophageal reflux disease 06/23/2021  ? Localized morphea 06/23/2021  ? Lumbar back sprain 06/23/2021  ? Menopause 06/23/2021  ? Osteoarthritis 06/23/2021  ? Acute medial  meniscus tear of right knee   ? Vulvar irritation 03/04/2020  ? Lichen sclerosus 26/71/2458  ? Pain in both hands 01/17/2020  ? Vitamin D deficiency 05/20/2009  ? Allergic rhinitis 05/20/2009  ? ELEVATED BLOOD PRESSURE WITHOUT DIAGNOSIS OF HYPERTENSION 05/20/2009  ? HASHIMOTO'S THYROIDITIS 12/07/2007  ? Obesity 06/11/2007  ? Essential hypertension 06/11/2007  ? Exacerbation of asthma 06/11/2007  ? HEMATURIA, HX OF 06/11/2007  ? ? ?REFERRING DIAG: K99.833 (ICD-10-CM) - S/P total knee arthroplasty, left ? ?THERAPY DIAG:  ?Acute pain of left knee ? ?Stiffness of left knee, not elsewhere classified ? ?Muscle weakness (generalized) ? ?Localized edema ? ?PERTINENT HISTORY: PMH: Lt TKA 09/14/21, Rt knee scope with meniscal repair 03/30/21 ? ?ONSET DATE: 09/14/21 ? ?PRECAUTIONS: None ? ?SUBJECTIVE: She has not been using cane for basic community without issues.   ? ?PAIN:  ?Are you having pain?  Yes: NPRS scale: 0-4/10 ?Pain location: knee more in quads distally and hamstrings ?Pain description: intermittent  tight pull & sore, some spasms ?Aggravating factors: end of day ?Relieving factors: straightening knee ? ? ?OBJECTIVE:  ?  ?DIAGNOSTIC FINDINGS:  ?XR 09/29/21 "Left total knee prosthesis in good position alignment without incompetent features.  No periprosthetic  ?fracture noted.  No evidence of loosening or any lucency between the bone and prosthesis. ?  ?PATIENT  SURVEYS:  ?10/01/21: FOTO 53% functional, Goal is 72% ?  ?MUSCLE LENGTH: ?10/01/2021: Tight hamstrings and quads on Lt ?  ?  ?PALPATION: ?No significant warmth or redness with palpation, TTP in quads and hamstrings ?  ?LE ROM: ?  ? ROM Left ?10/01/21 Left ?10/11/21 Left ?10/18/21 Left ?10/27/21  ?Knee flexion 65 P: ?78* A:82 ?P:85 A: ?P:92*  ?Knee extension 8 P: -6* A:-2   ? (Blank rows = not tested) ?  ?LE MMT: ?  ?MMT Right ?10/01/2021 Left ?10/01/2021  ?Hip flexion   3  ?Hip extension      ?Hip abduction   4  ?Hip adduction      ?Hip internal rotation      ?Hip  external rotation      ?Knee flexion   3  ?Knee extension   2+  ?Ankle dorsiflexion      ?Ankle plantarflexion      ?Ankle inversion      ?Ankle eversion      ?(Blank rows = not tested) ?  ?  ?FUNCTIONAL TESTS:  ?10/01/21: 5 times sit to stand: 18.99  must use UE's ?  ?  ?  ?TODAY'S TREATMENT: ?10/27/2021 ?Therapeutic Exercise: ?Aerobic:Nu step seat 9 level 8 with BLEs & BUEs  X 5.5 min and 2.5 min LEs only for 8 min total ?Machine for strengthening: leg press BLEs 100# 10 reps 2 sets 5 sec hold ext & flex, slow controlled motion. LLE only 50# 10 reps 1 set ?Hamstring stretch SLR w/strap DF to neutral 30 sec hold 2 reps ?Gastroc stretch step heel depression 30 sec hold 2 reps ?quad stretch 30 sec X 2 in supine with strap on Lt ?Step up & step down LLE 4" step single UE support 10 reps. PT demo & verbal cues on technique. ?LAQ 4# 2X15 on LLE ?Sit to stands 2X10 no UE support from 18" mat  ?Hamstring curl green theraband 10 reps 1 sets ?  ?Manual Therapy: PROM with end range stretch  ?Modalities: Vaso left knee high compression 34* for 10 min intermittent prop for ext ? ?10/20/2021 ?Therapeutic Exercise: ?Aerobic:Nu step X 8 min for ROM ?quad stretch 30 sec X 3 in supine with strap on Lt ?Seated knee flexion AAROM tailgate stretch 10 sec X 3 min ?LAQ 3# 3X10 on Left ?Sit to stands 2X10 no UE support from slightly elevated mat  ?TRX squats 2 sec hold X 10 ?Lunge stretch with Lt foot on 8 inch step 5 sec X10 ?  ?Manual Therapy: PROM with end range stretch, manual hamstring stretch and knee flexion mobs. ?Modalities: Vaso left knee medium compression 34* for 10 min ? ?10/18/2021 ?Therapeutic Exercise: ?Aerobic: bike rocking X 8 min for ROM ?Gastroc stretch 30 sec X 3 on slantboard ?Seated knee flexion AAROM tailgate stretch 10 sec X 3 min ?LAQ 3# 3X10 on Left ?Seated hamstring curls green 2X10 ? ?Gait:  She arrives with [facility] for community ambulation but able to ambulate independently in clinic without AD.  ?Manual Therapy: PROM  with end range stretch, manual hamstring stretch and knee flexion mobs. ?Modalities: Vaso left knee medium compression 34* for 10 min ? ? ?  ?PATIENT EDUCATION:  ?Education details: HEP,POC ?Person educated: Patient ?Education method: Explanation, Demonstration, Verbal cues, and Handouts ?Education comprehension: verbalized understanding, returned demonstration, and needs further education ?  ?  ?HOME EXERCISE PROGRAM: ?Access Code: [email or web address] ?URL: [email or web address] ?Date: 10/01/2021 ?Prepared by: Elsie Ra ?  ?Exercises ?Supine Quad Set - 2 x daily -  6 x weekly - 2-3 sets - 10 reps - 5 sec hold ?Supine Hamstring Stretch with Strap - 2 x daily - 6 x weekly - 3 sets - 30 hold ?Supine Heel Slide with Strap - 2 x daily - 6 x weekly - 2-3 sets - 10 reps - 5 hold ?Supine Knee Extension Mobilization with Weight - 2 x daily - 6 x weekly - 1 sets - 3 min hold ?Seated Long Arc Quad - 2 x daily - 6 x weekly - 2-3 sets - 10 reps - 3 hold ?Seated Knee Flexion Stretch - 2 x daily - 6 x weekly - 1-2 sets - 10 reps - 5 sec hold ?  ?  ?  ?  ?ASSESSMENT: ?  ?CLINICAL IMPRESSION:  PT progressed strengthening exercises today which challenged her and some muscle fatigue at end noted.  PT also worked on stretching 3 muscle groups crossing knee.  She responded well to contract relax for range.  ?  ?OBJECTIVE IMPAIRMENTS: decreased activity tolerance, difficulty walking, decreased balance, decreased endurance, decreased mobility, decreased ROM, decreased strength, impaired flexibility, impaired LE use, postural dysfunction, and pain. ?  ?ACTIVITY LIMITATIONS: bending, lifting, carry, locomotion, cleaning, community activity, driving, and or occupation ?  ?PERSONAL FACTORS: OA, Lt TKA 09/14/21 ?  ?REHAB POTENTIAL: Good ?  ?CLINICAL DECISION MAKING: stable/uncomplicated ? ?EVALUATION COMPLEXITY: Low ?  ?GOALS: ?Short term PT Goals Target date: 10/29/2021 ?Pt will be I and compliant with HEP. ?Baseline:  ?Goal status:  Met ?Pt will decrease pain by 25% overall with activity ?Baseline: ?Goal status: Met ?  ?Long term PT goals Target date: 11/26/2021 ?Pt will improve Lt knee ROM 0-110 deg to improve functional mobility ?Baseline:s

## 2021-10-28 ENCOUNTER — Other Ambulatory Visit (HOSPITAL_COMMUNITY): Payer: Self-pay

## 2021-10-29 ENCOUNTER — Other Ambulatory Visit: Payer: Self-pay

## 2021-10-29 ENCOUNTER — Ambulatory Visit (INDEPENDENT_AMBULATORY_CARE_PROVIDER_SITE_OTHER): Payer: No Typology Code available for payment source | Admitting: Rehabilitative and Restorative Service Providers"

## 2021-10-29 ENCOUNTER — Encounter: Payer: Self-pay | Admitting: Rehabilitative and Restorative Service Providers"

## 2021-10-29 DIAGNOSIS — M25662 Stiffness of left knee, not elsewhere classified: Secondary | ICD-10-CM | POA: Diagnosis not present

## 2021-10-29 DIAGNOSIS — M6281 Muscle weakness (generalized): Secondary | ICD-10-CM | POA: Diagnosis not present

## 2021-10-29 DIAGNOSIS — R600 Localized edema: Secondary | ICD-10-CM

## 2021-10-29 DIAGNOSIS — M25562 Pain in left knee: Secondary | ICD-10-CM | POA: Diagnosis not present

## 2021-10-29 NOTE — Therapy (Addendum)
?OUTPATIENT PHYSICAL THERAPY TREATMENT NOTE ? ? ?Patient Name: Kaitlyn Kelly ?MRN: [id number] ?DOB:1960-11-03, 61 y.o., female ?Today's Date: 10/29/2021 ? ?PCP: Maximiano Coss, NP ?REFERRING PROVIDER: Donella Stade, PA-C ? ? PT End of Session - 10/29/21 0755   ? ? Visit Number 7   ? Number of Visits 16   ? Date for PT Re-Evaluation 11/26/21   ? Authorization Type [facility]   ? PT Start Time [id number]   ? PT Stop Time 7494   ? PT Time Calculation (min) 49 min   ? Activity Tolerance Patient tolerated treatment well   ? Behavior During Therapy [facility] for tasks assessed/performed   ? ?  ?  ? ?  ? ? ? ? ? ? ?Past Medical History:  ?Diagnosis Date  ? Anal fissure   ? related to [place]  ? Arthritis   ? Asthma   ? Hemorrhoids   ? History of kidney stones   ? Hypertension   ? Hypothyroidism   ? Medical history non-contributory   ? Pneumonia   ? PONV (postoperative nausea and vomiting)   ? Thyroid disease   ? ?Past Surgical History:  ?Procedure Laterality Date  ? COLONOSCOPY    ? had a 2014, 2017  ? KNEE ARTHROSCOPY WITH MENISCAL REPAIR Right 03/30/2021  ? Procedure: right knee meniscal root tear repair; arthroscopy;  Surgeon: Meredith Pel, MD;  Location: [place];  Service: Orthopedics;  Laterality: Right;  ? TOTAL ABDOMINAL HYSTERECTOMY Left 08/30/2002  ? TAH/RSO/LO cystectomy for bleeding, bilateral cysts  ? TOTAL KNEE ARTHROPLASTY Left 09/14/2021  ? Procedure: LEFT TOTAL KNEE ARTHROPLASTY;  Surgeon: Meredith Pel, MD;  Location: [place];  Service: Orthopedics;  Laterality: Left;  ? TUBAL LIGATION    ? VULVA /PERINEUM BIOPSY  03/08/2020  ?    ? ?Patient Active Problem List  ? Diagnosis Date Noted  ? S/P TKR (total knee replacement), left 09/14/2021  ? Acquired hypothyroidism 06/23/2021  ? Arthralgia of multiple joints 06/23/2021  ? Gastroesophageal reflux disease 06/23/2021  ? Localized morphea 06/23/2021  ? Lumbar back sprain 06/23/2021  ? Menopause 06/23/2021  ? Osteoarthritis 06/23/2021  ? Acute medial  meniscus tear of right knee   ? Vulvar irritation 03/04/2020  ? Lichen sclerosus 49/67/5916  ? Pain in both hands 01/17/2020  ? Vitamin D deficiency 05/20/2009  ? Allergic rhinitis 05/20/2009  ? ELEVATED BLOOD PRESSURE WITHOUT DIAGNOSIS OF HYPERTENSION 05/20/2009  ? HASHIMOTO'S THYROIDITIS 12/07/2007  ? Obesity 06/11/2007  ? Essential hypertension 06/11/2007  ? Exacerbation of asthma 06/11/2007  ? HEMATURIA, HX OF 06/11/2007  ? ? ?REFERRING DIAG: B84.665 (ICD-10-CM) - S/P total knee arthroplasty, left ? ?THERAPY DIAG:  ?Acute pain of left knee ? ?Stiffness of left knee, not elsewhere classified ? ?Muscle weakness (generalized) ? ?Localized edema ? ?PERTINENT HISTORY: PMH: Lt TKA 09/14/21, Rt knee scope with meniscal repair 03/30/21 ? ?ONSET DATE: 09/14/21 ? ?PRECAUTIONS: None ? ?SUBJECTIVE: Pt indicated symptoms below: ? ?PAIN:  ?Are you having pain?  Yes:  ?NPRS scale 2/10 ?Pain location: Lt knee  ?Pain description: intermittent sore ?Aggravating factors: end of day ?Relieving factors: straightening knee ? ? ?OBJECTIVE:  ?  ?DIAGNOSTIC FINDINGS:  ?XR 09/29/21 "Left total knee prosthesis in good position alignment without incompetent features.  No periprosthetic  ?fracture noted.  No evidence of loosening or any lucency between the bone and prosthesis. ?  ?PATIENT SURVEYS:  ?10/01/21: FOTO 53% functional, Goal is 72% ?  ?MUSCLE LENGTH: ?10/01/2021: Tight hamstrings and  quads on Lt ?  ?  ?PALPATION: ?No significant warmth or redness with palpation, TTP in quads and hamstrings ?  ?LE ROM: ?  ? ROM Left ?10/01/21 Left ?10/11/21 Left ?10/18/21 Left ?10/27/21  ?Knee flexion 65 P: ?78* A:82 ?P:85 A: ?P:92*  ?Knee extension 8 P: -6* A:-2   ? (Blank rows = not tested) ?  ?LE MMT: ?  ?MMT Right ?10/01/2021 Left ?10/01/2021  ?Hip flexion   3  ?Hip extension      ?Hip abduction   4  ?Hip adduction      ?Hip internal rotation      ?Hip external rotation      ?Knee flexion   3  ?Knee extension   2+  ?Ankle dorsiflexion      ?Ankle  plantarflexion      ?Ankle inversion      ?Ankle eversion      ?(Blank rows = not tested) ?  ?  ?FUNCTIONAL TESTS:  ?10/01/21: 5 times sit to stand: 18.99  must use UE's ?  ?  ?  ?TODAY'S TREATMENT: ?10/29/2021 ?Therapeutic Exercise: ?Nustep lvl 6 8 mins seat 9 ?Hamstring stretch SLR w/strap DF to neutral 30 sec hold 3 reps ?Gastroc stretch step heel depression 30 sec hold 3 reps ?Seated SLR x 15 Lt ?LAQ 5 # 2X15 on LLE c contralateral movement opposite, pause in end range flexion and extension ? ?  ?TherActivity ?Leg press double leg 100 lbs x 15, Lt leg only 50 lbs 2 x 15 ?Lateral step down eccentric lowering 4 inch on Lt leg 2 x 10 with single hand assist on bar ? ?Modalities: Vaso left knee high compression 34* for 10 min intermittent prop for ext ? ?10/27/2021 ?Therapeutic Exercise: ?Aerobic:Nu step seat 9 level 8 with BLEs & BUEs  X 5.5 min and 2.5 min LEs only for 8 min total ?Machine for strengthening: leg press BLEs 100# 10 reps 2 sets 5 sec hold ext & flex, slow controlled motion. LLE only 50# 10 reps 1 set ?Hamstring stretch SLR w/strap DF to neutral 30 sec hold 2 reps ?Gastroc stretch step heel depression 30 sec hold 2 reps ?quad stretch 30 sec X 2 in supine with strap on Lt ?Step up & step down LLE 4" step single UE support 10 reps. PT demo & verbal cues on technique. ?LAQ 4# 2X15 on LLE ?Sit to stands 2X10 no UE support from 18" mat  ?Hamstring curl green theraband 10 reps 1 sets ?  ?Manual Therapy: PROM with end range stretch  ?Modalities: Vaso left knee high compression 34* for 10 min intermittent prop for ext ? ?10/20/2021 ?Therapeutic Exercise: ?Aerobic:Nu step X 8 min for ROM ?quad stretch 30 sec X 3 in supine with strap on Lt ?Seated knee flexion AAROM tailgate stretch 10 sec X 3 min ?LAQ 3# 3X10 on Left ?Sit to stands 2X10 no UE support from slightly elevated mat  ?TRX squats 2 sec hold X 10 ?Lunge stretch with Lt foot on 8 inch step 5 sec X10 ?  ?Manual Therapy: PROM with end range stretch, manual  hamstring stretch and knee flexion mobs. ?Modalities: Vaso left knee medium compression 34* for 10 min ? ?10/18/2021 ?Therapeutic Exercise: ?Aerobic: bike rocking X 8 min for ROM ?Gastroc stretch 30 sec X 3 on slantboard ?Seated knee flexion AAROM tailgate stretch 10 sec X 3 min ?LAQ 3# 3X10 on Left ?Seated hamstring curls green 2X10 ? ?Gait:  She arrives with [facility] for community ambulation but  able to ambulate independently in clinic without AD.  ?Manual Therapy: PROM with end range stretch, manual hamstring stretch and knee flexion mobs. ?Modalities: Vaso left knee medium compression 34* for 10 min ? ? ?  ?PATIENT EDUCATION:  ?Education details: HEP,POC ?Person educated: Patient ?Education method: Explanation, Demonstration, Verbal cues, and Handouts ?Education comprehension: verbalized understanding, returned demonstration, and needs further education ?  ?  ?HOME EXERCISE PROGRAM: ?Access Code: [email or web address] ?URL: [email or web address] ?Date: 10/01/2021 ?Prepared by: Elsie Ra ?  ?Exercises ?Supine Quad Set - 2 x daily - 6 x weekly - 2-3 sets - 10 reps - 5 sec hold ?Supine Hamstring Stretch with Strap - 2 x daily - 6 x weekly - 3 sets - 30 hold ?Supine Heel Slide with Strap - 2 x daily - 6 x weekly - 2-3 sets - 10 reps - 5 hold ?Supine Knee Extension Mobilization with Weight - 2 x daily - 6 x weekly - 1 sets - 3 min hold ?Seated Long Arc Quad - 2 x daily - 6 x weekly - 2-3 sets - 10 reps - 3 hold ?Seated Knee Flexion Stretch - 2 x daily - 6 x weekly - 1-2 sets - 10 reps - 5 sec hold ?  ?  ?  ?  ?ASSESSMENT: ?  ?CLINICAL IMPRESSION:   ?Fair control in eccentric lowering on step on Lt LE at this time but improving steadily.  Pt to benefit from continued quad strengthening and end range mobility improvements to facilitate functional movement control and mobility.  Continued skilled PT services indicated at this time.  ?  ?OBJECTIVE IMPAIRMENTS: decreased activity tolerance, difficulty walking, decreased  balance, decreased endurance, decreased mobility, decreased ROM, decreased strength, impaired flexibility, impaired LE use, postural dysfunction, and pain. ?  ?ACTIVITY LIMITATIONS: bending, lifting, carry, locomot

## 2021-11-01 ENCOUNTER — Ambulatory Visit (INDEPENDENT_AMBULATORY_CARE_PROVIDER_SITE_OTHER): Payer: No Typology Code available for payment source | Admitting: Physical Therapy

## 2021-11-01 ENCOUNTER — Encounter: Payer: Self-pay | Admitting: Physical Therapy

## 2021-11-01 ENCOUNTER — Other Ambulatory Visit: Payer: Self-pay

## 2021-11-01 DIAGNOSIS — M6281 Muscle weakness (generalized): Secondary | ICD-10-CM

## 2021-11-01 DIAGNOSIS — R600 Localized edema: Secondary | ICD-10-CM | POA: Diagnosis not present

## 2021-11-01 DIAGNOSIS — M25562 Pain in left knee: Secondary | ICD-10-CM

## 2021-11-01 DIAGNOSIS — M25662 Stiffness of left knee, not elsewhere classified: Secondary | ICD-10-CM

## 2021-11-01 NOTE — Therapy (Signed)
?OUTPATIENT PHYSICAL THERAPY TREATMENT NOTE ? ? ?Patient Name: Kaitlyn Kelly ?MRN: [id number] ?DOB:1960/07/19, 61 y.o., female ?Today's Date: 11/01/2021 ? ?PCP: Maximiano Coss, NP ?REFERRING PROVIDER: Donella Stade, PA-C ? ? PT End of Session - 11/01/21 0802   ? ? Visit Number 8   ? Number of Visits 16   ? Date for PT Re-Evaluation 11/26/21   ? Authorization Type [facility]   ? PT Start Time 0800   ? PT Stop Time 0853   ? PT Time Calculation (min) 53 min   ? Activity Tolerance Patient tolerated treatment well   ? Behavior During Therapy [facility] for tasks assessed/performed   ? ?  ?  ? ?  ? ? ? ? ? ? ? ?Past Medical History:  ?Diagnosis Date  ? Anal fissure   ? related to [place]  ? Arthritis   ? Asthma   ? Hemorrhoids   ? History of kidney stones   ? Hypertension   ? Hypothyroidism   ? Medical history non-contributory   ? Pneumonia   ? PONV (postoperative nausea and vomiting)   ? Thyroid disease   ? ?Past Surgical History:  ?Procedure Laterality Date  ? COLONOSCOPY    ? had a 2014, 2017  ? KNEE ARTHROSCOPY WITH MENISCAL REPAIR Right 03/30/2021  ? Procedure: right knee meniscal root tear repair; arthroscopy;  Surgeon: Meredith Pel, MD;  Location: [place];  Service: Orthopedics;  Laterality: Right;  ? TOTAL ABDOMINAL HYSTERECTOMY Left 08/30/2002  ? TAH/RSO/LO cystectomy for bleeding, bilateral cysts  ? TOTAL KNEE ARTHROPLASTY Left 09/14/2021  ? Procedure: LEFT TOTAL KNEE ARTHROPLASTY;  Surgeon: Meredith Pel, MD;  Location: [place];  Service: Orthopedics;  Laterality: Left;  ? TUBAL LIGATION    ? VULVA /PERINEUM BIOPSY  03/08/2020  ?    ? ?Patient Active Problem List  ? Diagnosis Date Noted  ? S/P TKR (total knee replacement), left 09/14/2021  ? Acquired hypothyroidism 06/23/2021  ? Arthralgia of multiple joints 06/23/2021  ? Gastroesophageal reflux disease 06/23/2021  ? Localized morphea 06/23/2021  ? Lumbar back sprain 06/23/2021  ? Menopause 06/23/2021  ? Osteoarthritis 06/23/2021  ? Acute  medial meniscus tear of right knee   ? Vulvar irritation 03/04/2020  ? Lichen sclerosus 81/82/9937  ? Pain in both hands 01/17/2020  ? Vitamin D deficiency 05/20/2009  ? Allergic rhinitis 05/20/2009  ? ELEVATED BLOOD PRESSURE WITHOUT DIAGNOSIS OF HYPERTENSION 05/20/2009  ? HASHIMOTO'S THYROIDITIS 12/07/2007  ? Obesity 06/11/2007  ? Essential hypertension 06/11/2007  ? Exacerbation of asthma 06/11/2007  ? HEMATURIA, HX OF 06/11/2007  ? ? ?REFERRING DIAG: J69.678 (ICD-10-CM) - S/P total knee arthroplasty, left ? ?THERAPY DIAG:  ?Acute pain of left knee ? ?Stiffness of left knee, not elsewhere classified ? ?Muscle weakness (generalized) ? ?Localized edema ? ?PERTINENT HISTORY: PMH: Lt TKA 09/14/21, Rt knee scope with meniscal repair 03/30/21 ? ?ONSET DATE: 09/14/21 ? ?PRECAUTIONS: None ? ?SUBJECTIVE: Work is going well.  Her exercises are going well.   ? ?PAIN:  ?Are you having pain?  Yes:  ?NPRS scale:  this morning 1/10 and over weekend 0/10 - 7-8/10  ?Pain location: Lt knee  ?Pain description: intermittent sore ?Aggravating factors: on feet longer so end of day ?Relieving factors: straightening knee ? ? ?OBJECTIVE:  ?  ?DIAGNOSTIC FINDINGS:  ?XR 09/29/21 "Left total knee prosthesis in good position alignment without incompetent features.  No periprosthetic  ?fracture noted.  No evidence of loosening or any lucency between the  bone and prosthesis. ?  ?PATIENT SURVEYS:  ?10/01/21: FOTO 53% functional, Goal is 72% ?  ?MUSCLE LENGTH: ?10/01/2021: Tight hamstrings and quads on Lt ?  ?  ?PALPATION: ?No significant warmth or redness with palpation, TTP in quads and hamstrings ?  ?LE ROM: ?  ? ROM ?P:passive ?A:active Left ?10/01/21 Left ?10/11/21 Left ?10/18/21 Left ?10/27/21 Left ?11/01/21  ?Knee flexion 65 P: ?78* A:82 ?P:85 A: ?P:92* A: 87* ?seated  ?Knee extension 8 P: -6* A:-2  A: -2 ?LAQ  ? (Blank rows = not tested) ?  ?LE MMT: ?  ?MMT Right ?10/01/2021 Left ?10/01/2021  ?Hip flexion   3  ?Hip extension      ?Hip abduction   4   ?Hip adduction      ?Hip internal rotation      ?Hip external rotation      ?Knee flexion   3  ?Knee extension   2+  ?Ankle dorsiflexion      ?Ankle plantarflexion      ?Ankle inversion      ?Ankle eversion      ?(Blank rows = not tested) ?  ?  ?FUNCTIONAL TESTS:  ?10/01/21: 5 times sit to stand: 18.99  must use UE's ?  ?  ?  ?TODAY'S TREATMENT: ?11/01/2021 ?Therapeutic Exercise: ?Recumbent SciFit bike rocking stretch using BLEs / BUEs 8 mins seat 9 ?Hamstring stretch SLR w/strap DF to neutral 30 sec hold 2 reps ?Gastroc stretch step heel depression 30 sec hold 2 reps ?Quad stretch hooklying supine LE over edge strap knee flexion 30 sec hold 2 reps ?supine SLR x 15 Lt ?LAQ 5 # 2X15 on LLE c contralateral movement opposite, pause in end range flexion and extension ? ?  ?TherActivity ?Leg press double leg 106 lbs x 15, Lt leg only 50 lbs 2 x 15 ?Step up 4" step no UE support 10 reps PT demo & verbal cues on technique ?Step down 4" step single UE support 10 reps PT demo & verbal cues on technique ?Lateral step up 4 inch on Lt leg 10 without UE support PT demo & verbal cues on technique ? ?Manual Therapy: PROM with end range stretch & contract relax agonist & antigonist ? ?Modalities: Vaso left knee high compression 34* for 10 min intermittent prop for ext ? ?10/29/2021 ?Therapeutic Exercise: ?Nustep lvl 6 8 mins seat 9 ?Hamstring stretch SLR w/strap DF to neutral 30 sec hold 3 reps ?Gastroc stretch step heel depression 30 sec hold 3 reps ?Seated SLR x 15 Lt ?LAQ 5 # 2X15 on LLE c contralateral movement opposite, pause in end range flexion and extension ? ?  ?TherActivity ?Leg press double leg 100 lbs x 15, Lt leg only 50 lbs 2 x 15 ?Lateral step down eccentric lowering 4 inch on Lt leg 2 x 10 with single hand assist on bar ? ?Modalities: Vaso left knee high compression 34* for 10 min intermittent prop for ext ? ?10/27/2021 ?Therapeutic Exercise: ?Aerobic:Nu step seat 9 level 8 with BLEs & BUEs  X 5.5 min and 2.5 min LEs  only for 8 min total ?Machine for strengthening: leg press BLEs 100# 10 reps 2 sets 5 sec hold ext & flex, slow controlled motion. LLE only 50# 10 reps 1 set ?Hamstring stretch SLR w/strap DF to neutral 30 sec hold 2 reps ?Gastroc stretch step heel depression 30 sec hold 2 reps ?quad stretch 30 sec X 2 in supine with strap on Lt ?Step up & step down LLE 4"  step single UE support 10 reps. PT demo & verbal cues on technique. ?LAQ 4# 2X15 on LLE ?Sit to stands 2X10 no UE support from 18" mat  ?Hamstring curl green theraband 10 reps 1 sets ?  ?Manual Therapy: PROM with end range stretch  ?Modalities: Vaso left knee high compression 34* for 10 min intermittent prop for ext ? ?PATIENT EDUCATION:  ?Education details: HEP,POC ?Person educated: Patient ?Education method: Explanation, Demonstration, Verbal cues, and Handouts ?Education comprehension: verbalized understanding, returned demonstration, and needs further education ?  ?  ?HOME EXERCISE PROGRAM: ?Access Code: [email or web address] ?URL: [email or web address] ?Date: 10/01/2021 ?Prepared by: Elsie Ra ?  ?Exercises ?Supine Quad Set - 2 x daily - 6 x weekly - 2-3 sets - 10 reps - 5 sec hold ?Supine Hamstring Stretch with Strap - 2 x daily - 6 x weekly - 3 sets - 30 hold ?Supine Heel Slide with Strap - 2 x daily - 6 x weekly - 2-3 sets - 10 reps - 5 hold ?Supine Knee Extension Mobilization with Weight - 2 x daily - 6 x weekly - 1 sets - 3 min hold ?Seated Long Arc Quad - 2 x daily - 6 x weekly - 2-3 sets - 10 reps - 3 hold ?Seated Knee Flexion Stretch - 2 x daily - 6 x weekly - 1-2 sets - 10 reps - 5 sec hold ?  ?  ?  ?  ?ASSESSMENT: ?  ?CLINICAL IMPRESSION:   ?PT instructed pt in stretching 3 muscle groups crossing knee to add to HEP and she verbalized understanding.  Patient is slowly improving AROM with skilled PT.  ?  ?OBJECTIVE IMPAIRMENTS: decreased activity tolerance, difficulty walking, decreased balance, decreased endurance, decreased mobility, decreased  ROM, decreased strength, impaired flexibility, impaired LE use, postural dysfunction, and pain. ?  ?ACTIVITY LIMITATIONS: bending, lifting, carry, locomotion, cleaning, community activity, driving, and or occu

## 2021-11-03 ENCOUNTER — Other Ambulatory Visit: Payer: Self-pay | Admitting: Surgical

## 2021-11-03 MED ORDER — CEPHALEXIN 500 MG PO CAPS
500.0000 mg | ORAL_CAPSULE | Freq: Four times a day (QID) | ORAL | 0 refills | Status: DC
  Filled 2021-11-03: qty 40, 10d supply, fill #0

## 2021-11-04 ENCOUNTER — Other Ambulatory Visit (HOSPITAL_COMMUNITY): Payer: Self-pay

## 2021-11-04 ENCOUNTER — Encounter: Payer: No Typology Code available for payment source | Admitting: Rehabilitative and Restorative Service Providers"

## 2021-11-04 NOTE — Telephone Encounter (Signed)
I called her yesterday, sent in abx. She will come in around 8  AM on friday

## 2021-11-05 ENCOUNTER — Ambulatory Visit (INDEPENDENT_AMBULATORY_CARE_PROVIDER_SITE_OTHER): Payer: No Typology Code available for payment source | Admitting: Surgical

## 2021-11-05 DIAGNOSIS — Z96652 Presence of left artificial knee joint: Secondary | ICD-10-CM

## 2021-11-07 ENCOUNTER — Encounter: Payer: Self-pay | Admitting: Orthopedic Surgery

## 2021-11-07 NOTE — Progress Notes (Signed)
? ?Post-Op Visit Note ?  ?Patient: Kaitlyn Kelly           ?Date of Birth: 1961-01-24           ?MRN: [id number] ?Visit Date: 11/05/2021 ?PCP: Maximiano Coss, NP ? ? ?Assessment & Plan: ? ?Chief Complaint:  ?Chief Complaint  ?Patient presents with  ? Other  ?  Check incision  ? ?Visit Diagnoses:  ?1. S/P total knee arthroplasty, left   ? ? ?Plan: Patient is a 61 year old female presents s/p left total knee arthroplasty on 09/14/2021.  She is doing well overall.  Pain is steadily improving.  She comes in today for evaluation of the incision.  She states she has noticed some increased redness around the superior aspect of the incision and has had some drainage.  She called couple days ago and was placed on Keflex.  She denies any fevers, chills, night sweats, increasing redness.  The redness has improved compared to couple days ago since being on the Keflex.  She has no increased knee pain. ? ?On exam, patient has no effusion in the knee.  No significant pain with passive range of motion of the operative knee except with terminal range of motion.  No calf tenderness.  Negative Homans' sign.  The incision looks to be well-healed except for a small superficial dehiscence at the superior aspect of the incision.  It does not look infected and there is no gross purulent drainage.  It does not probe any deeper than about 2 mm.  There is some Vicryl suture material that was removed with the use of forceps.  Does not appear to be a sinus tract.  Plan for dry dressings and follow-up visit next week for clinical recheck on her incision.  Should heal by secondary intention without difficulty but want to monitor to make sure it does not become infected. ? ?Follow-Up Instructions: No follow-ups on file.  ? ?Orders:  ?No orders of the defined types were placed in this encounter. ? ?No orders of the defined types were placed in this encounter. ? ? ?Imaging: ?No results found. ? ?PMFS History: ?Patient Active Problem List  ?  Diagnosis Date Noted  ? S/P TKR (total knee replacement), left 09/14/2021  ? Acquired hypothyroidism 06/23/2021  ? Arthralgia of multiple joints 06/23/2021  ? Gastroesophageal reflux disease 06/23/2021  ? Localized morphea 06/23/2021  ? Lumbar back sprain 06/23/2021  ? Menopause 06/23/2021  ? Osteoarthritis 06/23/2021  ? Acute medial meniscus tear of right knee   ? Vulvar irritation 03/04/2020  ? Lichen sclerosus 94/17/4081  ? Pain in both hands 01/17/2020  ? Vitamin D deficiency 05/20/2009  ? Allergic rhinitis 05/20/2009  ? ELEVATED BLOOD PRESSURE WITHOUT DIAGNOSIS OF HYPERTENSION 05/20/2009  ? HASHIMOTO'S THYROIDITIS 12/07/2007  ? Obesity 06/11/2007  ? Essential hypertension 06/11/2007  ? Exacerbation of asthma 06/11/2007  ? HEMATURIA, HX OF 06/11/2007  ? ?Past Medical History:  ?Diagnosis Date  ? Anal fissure   ? related to [place]  ? Arthritis   ? Asthma   ? Hemorrhoids   ? History of kidney stones   ? Hypertension   ? Hypothyroidism   ? Medical history non-contributory   ? Pneumonia   ? PONV (postoperative nausea and vomiting)   ? Thyroid disease   ?  ?Family History  ?Problem Relation Age of Onset  ? Heart disease Mother   ? Alcohol abuse Mother   ? Asthma Mother   ? Depression Mother   ? Drug abuse  Mother   ? Early death Mother   ? Hypertension Mother   ? Thyroid disease Sister   ? Diabetes Sister   ? Asthma Sister   ? Drug abuse Sister   ? Hypertension Sister   ? Psoriasis Sister   ? Interstitial cystitis Sister   ? Colon cancer Neg Hx   ? Esophageal cancer Neg Hx   ? Pancreatic cancer Neg Hx   ? Prostate cancer Neg Hx   ? Rectal cancer Neg Hx   ?  ?Past Surgical History:  ?Procedure Laterality Date  ? COLONOSCOPY    ? had a 2014, 2017  ? KNEE ARTHROSCOPY WITH MENISCAL REPAIR Right 03/30/2021  ? Procedure: right knee meniscal root tear repair; arthroscopy;  Surgeon: Meredith Pel, MD;  Location: [place];  Service: Orthopedics;  Laterality: Right;  ? TOTAL ABDOMINAL HYSTERECTOMY Left 08/30/2002   ? TAH/RSO/LO cystectomy for bleeding, bilateral cysts  ? TOTAL KNEE ARTHROPLASTY Left 09/14/2021  ? Procedure: LEFT TOTAL KNEE ARTHROPLASTY;  Surgeon: Meredith Pel, MD;  Location: [place];  Service: Orthopedics;  Laterality: Left;  ? TUBAL LIGATION    ? VULVA /PERINEUM BIOPSY  03/08/2020  ?    ? ?Social History  ? ?Occupational History  ? Not on file  ?Tobacco Use  ? Smoking status: Never  ? Smokeless tobacco: Never  ?Vaping Use  ? Vaping Use: Never used  ?Substance and Sexual Activity  ? Alcohol use: Yes  ?  Alcohol/week: 1.0 - 2.0 standard drink  ?  Types: 1 - 2 Glasses of wine per week  ?  Comment: socially  ? Drug use: No  ? Sexual activity: Yes  ? ? ? ?

## 2021-11-10 ENCOUNTER — Ambulatory Visit (INDEPENDENT_AMBULATORY_CARE_PROVIDER_SITE_OTHER): Payer: No Typology Code available for payment source | Admitting: Surgical

## 2021-11-10 ENCOUNTER — Ambulatory Visit (INDEPENDENT_AMBULATORY_CARE_PROVIDER_SITE_OTHER): Payer: No Typology Code available for payment source | Admitting: Physical Therapy

## 2021-11-10 ENCOUNTER — Other Ambulatory Visit: Payer: Self-pay

## 2021-11-10 ENCOUNTER — Encounter: Payer: Self-pay | Admitting: Surgical

## 2021-11-10 ENCOUNTER — Other Ambulatory Visit (HOSPITAL_COMMUNITY): Payer: Self-pay

## 2021-11-10 ENCOUNTER — Encounter: Payer: Self-pay | Admitting: Physical Therapy

## 2021-11-10 DIAGNOSIS — M6281 Muscle weakness (generalized): Secondary | ICD-10-CM

## 2021-11-10 DIAGNOSIS — R600 Localized edema: Secondary | ICD-10-CM

## 2021-11-10 DIAGNOSIS — M25562 Pain in left knee: Secondary | ICD-10-CM | POA: Diagnosis not present

## 2021-11-10 DIAGNOSIS — M25662 Stiffness of left knee, not elsewhere classified: Secondary | ICD-10-CM

## 2021-11-10 DIAGNOSIS — Z96652 Presence of left artificial knee joint: Secondary | ICD-10-CM

## 2021-11-10 MED ORDER — METHOCARBAMOL 500 MG PO TABS
500.0000 mg | ORAL_TABLET | Freq: Three times a day (TID) | ORAL | 2 refills | Status: DC | PRN
Start: 2021-11-10 — End: 2022-02-14
  Filled 2021-11-10: qty 30, 10d supply, fill #0
  Filled 2021-12-14: qty 30, 10d supply, fill #1
  Filled 2022-01-11 (×2): qty 30, 10d supply, fill #2

## 2021-11-10 NOTE — Progress Notes (Signed)
? ?Post-Op Visit Note ?  ?Patient: Kaitlyn Kelly           ?Date of Birth: [birth year]           ?MRN: [id number] ?Visit Date: 11/10/2021 ?PCP: Maximiano Coss, NP ? ? ?Assessment & Plan: ? ?Chief Complaint:  ?Chief Complaint  ?Patient presents with  ? Other  ?  Recheck incision  ? ?Visit Diagnoses:  ?1. S/P total knee arthroplasty, left   ? ? ?Plan: Patient is a 61 year old female who presents s/p left total knee arthroplasty on 09/14/2021.  She is doing well overall.  Mostly returning for incision recheck with small area of superficial wound dehiscence at the proximal aspect of the left knee incision.  Continues to deny any fevers or chills.  Currently taking Keflex 4 times daily.  No increase in her knee pain.  She feels her knee pain is continually improving.  Redness has improved.  No drainage since last appointment.  She had physical therapy today and had no difficulty completing this today. ? ?0 degrees extension and 90 degrees of knee flexion.  Incision is well-healed except for small area of superficial dehiscence at the proximal aspect of the incision.  Redness has improved since last appointment.  There is no expressible drainage.  There is no knee effusion.  No significant pain with passive motion from 0 to 90 degrees.  No calf tenderness.  Negative Homans' sign.  Patient is able to perform straight leg raise without extensor lag.  Ambulates without significant antalgia. ? ?Plan is refill Robaxin.  Return on next Thursday or Friday for wound recheck which will be when she has been off antibiotics for several days.  Call the office with any concerns in the meantime. ? ? ? ? ?Follow-Up Instructions: No follow-ups on file.  ? ?Orders:  ?No orders of the defined types were placed in this encounter. ? ?Meds ordered this encounter  ?Medications  ? methocarbamol (ROBAXIN) 500 MG tablet  ?  Sig: Take 1 tablet (500 mg total) by mouth every 8 (eight) hours as needed.  ?  Dispense:  30 tablet  ?  Refill:  2   ? ? ?Imaging: ?No results found. ? ?PMFS History: ?Patient Active Problem List  ? Diagnosis Date Noted  ? S/P TKR (total knee replacement), left 09/14/2021  ? Acquired hypothyroidism 06/23/2021  ? Arthralgia of multiple joints 06/23/2021  ? Gastroesophageal reflux disease 06/23/2021  ? Localized morphea 06/23/2021  ? Lumbar back sprain 06/23/2021  ? Menopause 06/23/2021  ? Osteoarthritis 06/23/2021  ? Acute medial meniscus tear of right knee   ? Vulvar irritation 03/04/2020  ? Lichen sclerosus 50/03/3817  ? Pain in both hands 01/17/2020  ? Vitamin D deficiency 05/20/2009  ? Allergic rhinitis 05/20/2009  ? ELEVATED BLOOD PRESSURE WITHOUT DIAGNOSIS OF HYPERTENSION 05/20/2009  ? HASHIMOTO'S THYROIDITIS 12/07/2007  ? Obesity 06/11/2007  ? Essential hypertension 06/11/2007  ? Exacerbation of asthma 06/11/2007  ? HEMATURIA, HX OF 06/11/2007  ? ?Past Medical History:  ?Diagnosis Date  ? Anal fissure   ? related to [place]  ? Arthritis   ? Asthma   ? Hemorrhoids   ? History of kidney stones   ? Hypertension   ? Hypothyroidism   ? Medical history non-contributory   ? Pneumonia   ? PONV (postoperative nausea and vomiting)   ? Thyroid disease   ?  ?Family History  ?Problem Relation Age of Onset  ? Heart disease Mother   ? Alcohol abuse Mother   ?  Asthma Mother   ? Depression Mother   ? Drug abuse Mother   ? Early death Mother   ? Hypertension Mother   ? Thyroid disease Sister   ? Diabetes Sister   ? Asthma Sister   ? Drug abuse Sister   ? Hypertension Sister   ? Psoriasis Sister   ? Interstitial cystitis Sister   ? Colon cancer Neg Hx   ? Esophageal cancer Neg Hx   ? Pancreatic cancer Neg Hx   ? Prostate cancer Neg Hx   ? Rectal cancer Neg Hx   ?  ?Past Surgical History:  ?Procedure Laterality Date  ? COLONOSCOPY    ? had a 2014, 2017  ? KNEE ARTHROSCOPY WITH MENISCAL REPAIR Right 03/30/2021  ? Procedure: right knee meniscal root tear repair; arthroscopy;  Surgeon: Meredith Pel, MD;  Location: [place];  Service:  Orthopedics;  Laterality: Right;  ? TOTAL ABDOMINAL HYSTERECTOMY Left 08/30/2002  ? TAH/RSO/LO cystectomy for bleeding, bilateral cysts  ? TOTAL KNEE ARTHROPLASTY Left 09/14/2021  ? Procedure: LEFT TOTAL KNEE ARTHROPLASTY;  Surgeon: Meredith Pel, MD;  Location: [place];  Service: Orthopedics;  Laterality: Left;  ? TUBAL LIGATION    ? VULVA /PERINEUM BIOPSY  03/08/2020  ?    ? ?Social History  ? ?Occupational History  ? Not on file  ?Tobacco Use  ? Smoking status: Never  ? Smokeless tobacco: Never  ?Vaping Use  ? Vaping Use: Never used  ?Substance and Sexual Activity  ? Alcohol use: Yes  ?  Alcohol/week: 1.0 - 2.0 standard drink  ?  Types: 1 - 2 Glasses of wine per week  ?  Comment: socially  ? Drug use: No  ? Sexual activity: Yes  ? ? ? ?

## 2021-11-10 NOTE — Therapy (Signed)
?OUTPATIENT PHYSICAL THERAPY TREATMENT NOTE ? ? ?Patient Name: Kaitlyn Kelly ?MRN: [id number] ?DOB:[birth year], 61 y.o., female ?Today's Date: 11/10/2021 ? ?PCP: Maximiano Coss, NP ?REFERRING PROVIDER: Donella Stade, PA-C ? ? PT End of Session - 11/10/21 0803   ? ? Visit Number 9   ? Number of Visits 16   ? Date for PT Re-Evaluation 11/26/21   ? Authorization Type [facility]   ? PT Start Time 0801   ? PT Stop Time 0855   ? PT Time Calculation (min) 54 min   ? Activity Tolerance Patient tolerated treatment well   ? Behavior During Therapy [facility] for tasks assessed/performed   ? ?  ?  ? ?  ? ? ? ? ? ? ? ? ?Past Medical History:  ?Diagnosis Date  ? Anal fissure   ? related to [place]  ? Arthritis   ? Asthma   ? Hemorrhoids   ? History of kidney stones   ? Hypertension   ? Hypothyroidism   ? Medical history non-contributory   ? Pneumonia   ? PONV (postoperative nausea and vomiting)   ? Thyroid disease   ? ?Past Surgical History:  ?Procedure Laterality Date  ? COLONOSCOPY    ? had a 2014, 2017  ? KNEE ARTHROSCOPY WITH MENISCAL REPAIR Right 03/30/2021  ? Procedure: right knee meniscal root tear repair; arthroscopy;  Surgeon: Meredith Pel, MD;  Location: [place];  Service: Orthopedics;  Laterality: Right;  ? TOTAL ABDOMINAL HYSTERECTOMY Left 08/30/2002  ? TAH/RSO/LO cystectomy for bleeding, bilateral cysts  ? TOTAL KNEE ARTHROPLASTY Left 09/14/2021  ? Procedure: LEFT TOTAL KNEE ARTHROPLASTY;  Surgeon: Meredith Pel, MD;  Location: [place];  Service: Orthopedics;  Laterality: Left;  ? TUBAL LIGATION    ? VULVA /PERINEUM BIOPSY  03/08/2020  ?    ? ?Patient Active Problem List  ? Diagnosis Date Noted  ? S/P TKR (total knee replacement), left 09/14/2021  ? Acquired hypothyroidism 06/23/2021  ? Arthralgia of multiple joints 06/23/2021  ? Gastroesophageal reflux disease 06/23/2021  ? Localized morphea 06/23/2021  ? Lumbar back sprain 06/23/2021  ? Menopause 06/23/2021  ? Osteoarthritis 06/23/2021  ? Acute  medial meniscus tear of right knee   ? Vulvar irritation 03/04/2020  ? Lichen sclerosus 50/27/7412  ? Pain in both hands 01/17/2020  ? Vitamin D deficiency 05/20/2009  ? Allergic rhinitis 05/20/2009  ? ELEVATED BLOOD PRESSURE WITHOUT DIAGNOSIS OF HYPERTENSION 05/20/2009  ? HASHIMOTO'S THYROIDITIS 12/07/2007  ? Obesity 06/11/2007  ? Essential hypertension 06/11/2007  ? Exacerbation of asthma 06/11/2007  ? HEMATURIA, HX OF 06/11/2007  ? ? ?REFERRING DIAG: I78.676 (ICD-10-CM) - S/P total knee arthroplasty, left ? ?THERAPY DIAG:  ?Acute pain of left knee ? ?Stiffness of left knee, not elsewhere classified ? ?Muscle weakness (generalized) ? ?Localized edema ? ?PERTINENT HISTORY: PMH: Lt TKA 09/14/21, Rt knee scope with meniscal repair 03/30/21 ? ?ONSET DATE: 09/14/21 ? ?PRECAUTIONS: None ? ?SUBJECTIVE: She had a suture that worked outward that caused wound at proximal incision.  PA held PT for one session last week to enable some healing.  She thinks ? ?PAIN:  ?Are you having pain?  Yes:  ?NPRS scale:  this morning 0/10 and over weekend 0/10 - 3/10 for knee but suture was 8/10 ?Pain location: Lt knee  ?Pain description: intermittent sore ?Aggravating factors: on feet longer so end of day ?Relieving factors: straightening knee ? ? ?OBJECTIVE:  ?  ?DIAGNOSTIC FINDINGS:  ?XR 09/29/21 "Left total knee prosthesis  in good position alignment without incompetent features.  No periprosthetic  ?fracture noted.  No evidence of loosening or any lucency between the bone and prosthesis. ?  ?PATIENT SURVEYS:  ?10/01/21: FOTO 53% functional, Goal is 72% ?  ?MUSCLE LENGTH: ?10/01/2021: Tight hamstrings and quads on Lt ?  ?  ?PALPATION: ?No significant warmth or redness with palpation, TTP in quads and hamstrings ?  ?LE ROM: ?  ? ROM ?P:passive ?A:active Left ?10/01/21 Left ?10/11/21 Left ?10/18/21 Left ?10/27/21 Left ?11/01/21 Left ?11/10/21  ?Knee flexion 65 P: ?78* A:82 ?P:85 A: ?P:92* A: 87* ?seated A: 87* ?seated  ?Knee extension 8 P: -6* A:-2   A: -2 ?LAQ A: -2* ?seated  ? (Blank rows = not tested) ?  ?LE MMT: ?  ?MMT Right ?10/01/2021 Left ?10/01/2021  ?Hip flexion   3  ?Hip extension      ?Hip abduction   4  ?Hip adduction      ?Hip internal rotation      ?Hip external rotation      ?Knee flexion   3  ?Knee extension   2+  ?Ankle dorsiflexion      ?Ankle plantarflexion      ?Ankle inversion      ?Ankle eversion      ?(Blank rows = not tested) ?  ?  ?FUNCTIONAL TESTS:  ?10/01/21: 5 times sit to stand: 18.99  must use UE's ?  ?  ?  ?TODAY'S TREATMENT: ?11/10/2021 ?Therapeutic Exercise: ?Recumbent bike rocking stretch 8 mins seat 7 ?Hamstring stretch SLR w/strap DF to neutral 30 sec hold 2 reps ?Gastroc stretch step heel depression 30 sec hold 2 reps ?Quad stretch hooklying supine LE over edge strap knee flexion 30 sec hold 2 reps ?Standing knee flex stretch Lt foot  in chair 10 sec hold 10 reps ?supine rolling bil. Feet 55cm with strap assist LLE knee flexion stretch 5 sec and leg press ext 15 reps ?LAQ 5 # 2X15 on LLE c contralateral movement opposite, pause in end range flexion and extension ? ?TherActivity ?Leg Press BLEs 106# 15 reps 2 sets with 5 sec hold flex & ext, slow controlled motion ?Step up 6" step no UE support 10 reps PT demo & verbal cues on technique ?Step down 6" step single UE support 10 reps PT demo & verbal cues on technique ?Lateral step up 6 inch on Lt leg 10 without UE support PT demo & verbal cues on technique ? ?Neuromuscular Re-ed:  tandem stance on foam beam with LLE in front & LLE in back 30 sec ea.  ? ?Manual Therapy: PROM with end range stretch & contract relax agonist & antigonist ? ?Modalities: Vaso left knee high compression 34* for 10 min intermittent prop for ext ? ?PATIENT EDUCATION:  ?Education details: added to HEP ?Person educated: Patient ?Education method: Explanation, Demonstration, Verbal cues, and Handouts ?Education comprehension: verbalized understanding, returned demonstration, and needs further  education ? ?Access Code: [email or web address] ?URL: [email or web address] ?Date: 11/10/2021 ?Prepared by: Jamey Reas ? ?Exercises ?ADDED ?- Seated Hamstring Stretch with Strap  - 2 x daily - 7 x weekly - 1 sets - 2-3 reps - 30 seconds hold ?- Gastroc Stretch on Step  - 2 x daily - 7 x weekly - 1 sets - 2-3 reps - 30 seconds hold ?- Supine Quadriceps Stretch with Strap on Table  - 2 x daily - 7 x weekly - 1 sets - 2-3 reps - 30 seconds hold ?- Seated Hamstring Curl with Anchored Resistance  -  2 x daily - 7 x weekly - 2 sets - 10 reps - 5 seconds hold ?- Standing Knee Flexion Stretch on Step  - 2 x daily - 7 x weekly - 1 sets - 10 reps - 10 seconds hold ? ?11/01/2021 ?Therapeutic Exercise: ?Recumbent SciFit bike rocking stretch using BLEs / BUEs 8 mins seat 9 ?Hamstring stretch SLR w/strap DF to neutral 30 sec hold 2 reps ?Gastroc stretch step heel depression 30 sec hold 2 reps ?Quad stretch hooklying supine LE over edge strap knee flexion 30 sec hold 2 reps ?supine SLR x 15 Lt ?LAQ 5 # 2X15 on LLE c contralateral movement opposite, pause in end range flexion and extension ? ?  ?TherActivity ?Leg press double leg 106 lbs x 15, Lt leg only 50 lbs 2 x 15 ?Step up 4" step no UE support 10 reps PT demo & verbal cues on technique ?Step down 4" step single UE support 10 reps PT demo & verbal cues on technique ?Lateral step up 4 inch on Lt leg 10 without UE support PT demo & verbal cues on technique ? ?Manual Therapy: PROM with end range stretch & contract relax agonist & antigonist ? ?Modalities: Vaso left knee high compression 34* for 10 min intermittent prop for ext ? ?10/29/2021 ?Therapeutic Exercise: ?Nustep lvl 6 8 mins seat 9 ?Hamstring stretch SLR w/strap DF to neutral 30 sec hold 3 reps ?Gastroc stretch step heel depression 30 sec hold 3 reps ?Seated SLR x 15 Lt ?LAQ 5 # 2X15 on LLE c contralateral movement opposite, pause in end range flexion and extension ? ?  ?TherActivity ?Leg press double leg 100 lbs x 15,  Lt leg only 50 lbs 2 x 15 ?Lateral step down eccentric lowering 4 inch on Lt leg 2 x 10 with single hand assist on bar ? ?Modalities: Vaso left knee high compression 34* for 10 min intermittent prop for ext ? ? ?PATIENT

## 2021-11-12 ENCOUNTER — Encounter: Payer: Self-pay | Admitting: Rehabilitative and Restorative Service Providers"

## 2021-11-12 ENCOUNTER — Other Ambulatory Visit: Payer: Self-pay

## 2021-11-12 ENCOUNTER — Ambulatory Visit (INDEPENDENT_AMBULATORY_CARE_PROVIDER_SITE_OTHER): Payer: No Typology Code available for payment source | Admitting: Rehabilitative and Restorative Service Providers"

## 2021-11-12 DIAGNOSIS — M25662 Stiffness of left knee, not elsewhere classified: Secondary | ICD-10-CM | POA: Diagnosis not present

## 2021-11-12 DIAGNOSIS — R600 Localized edema: Secondary | ICD-10-CM

## 2021-11-12 DIAGNOSIS — M25562 Pain in left knee: Secondary | ICD-10-CM

## 2021-11-12 DIAGNOSIS — M6281 Muscle weakness (generalized): Secondary | ICD-10-CM

## 2021-11-12 NOTE — Therapy (Signed)
?OUTPATIENT PHYSICAL THERAPY TREATMENT NOTE /PROGRESS NOTE ? ? ?Patient Name: Kaitlyn Kelly ?MRN: [id number] ?DOB:07-Sep-1960, 61 y.o., female ?Today's Date: 11/12/2021 ? ?PCP: Maximiano Coss, NP ?REFERRING PROVIDER: Donella Stade, PA-C ? ? PT End of Session - 11/12/21 0803   ? ? Visit Number 10   ? Number of Visits 16   ? Date for PT Re-Evaluation 11/26/21   ? Authorization Type [facility]   ? PT Start Time 0800   ? PT Stop Time 0850   ? PT Time Calculation (min) 50 min   ? Activity Tolerance Patient tolerated treatment well   ? Behavior During Therapy [facility] for tasks assessed/performed   ? ?  ?  ? ?  ? ?Progress Note ?Reporting Period 10/01/2021 to 11/12/2021 ? ?See note below for Objective Data and Assessment of Progress/Goals.  ? ? ?Past Medical History:  ?Diagnosis Date  ? Anal fissure   ? related to [place]  ? Arthritis   ? Asthma   ? Hemorrhoids   ? History of kidney stones   ? Hypertension   ? Hypothyroidism   ? Medical history non-contributory   ? Pneumonia   ? PONV (postoperative nausea and vomiting)   ? Thyroid disease   ? ?Past Surgical History:  ?Procedure Laterality Date  ? COLONOSCOPY    ? had a 2014, 2017  ? KNEE ARTHROSCOPY WITH MENISCAL REPAIR Right 03/30/2021  ? Procedure: right knee meniscal root tear repair; arthroscopy;  Surgeon: Meredith Pel, MD;  Location: [place];  Service: Orthopedics;  Laterality: Right;  ? TOTAL ABDOMINAL HYSTERECTOMY Left 08/30/2002  ? TAH/RSO/LO cystectomy for bleeding, bilateral cysts  ? TOTAL KNEE ARTHROPLASTY Left 09/14/2021  ? Procedure: LEFT TOTAL KNEE ARTHROPLASTY;  Surgeon: Meredith Pel, MD;  Location: [place];  Service: Orthopedics;  Laterality: Left;  ? TUBAL LIGATION    ? VULVA /PERINEUM BIOPSY  03/08/2020  ?    ? ?Patient Active Problem List  ? Diagnosis Date Noted  ? S/P TKR (total knee replacement), left 09/14/2021  ? Acquired hypothyroidism 06/23/2021  ? Arthralgia of multiple joints 06/23/2021  ? Gastroesophageal reflux disease  06/23/2021  ? Localized morphea 06/23/2021  ? Lumbar back sprain 06/23/2021  ? Menopause 06/23/2021  ? Osteoarthritis 06/23/2021  ? Acute medial meniscus tear of right knee   ? Vulvar irritation 03/04/2020  ? Lichen sclerosus 92/05/9416  ? Pain in both hands 01/17/2020  ? Vitamin D deficiency 05/20/2009  ? Allergic rhinitis 05/20/2009  ? ELEVATED BLOOD PRESSURE WITHOUT DIAGNOSIS OF HYPERTENSION 05/20/2009  ? HASHIMOTO'S THYROIDITIS 12/07/2007  ? Obesity 06/11/2007  ? Essential hypertension 06/11/2007  ? Exacerbation of asthma 06/11/2007  ? HEMATURIA, HX OF 06/11/2007  ? ? ?REFERRING DIAG: E08.144 (ICD-10-CM) - S/P total knee arthroplasty, left ? ?THERAPY DIAG:  ?Acute pain of left knee ? ?Stiffness of left knee, not elsewhere classified ? ?Muscle weakness (generalized) ? ?Localized edema ? ?PERTINENT HISTORY: PMH: Lt TKA 09/14/21, Rt knee scope with meniscal repair 03/30/21 ? ?ONSET DATE: 09/14/21 ? ?PRECAUTIONS: None ? ?SUBJECTIVE: Pt indicated having no pain complaints today upon arrival.  Indicated she was able to cross leg over the first time recently.  Pt indicated overall improvement to normal 85% at this time.   Has returned to work and sleeping in bed.  ? ?PAIN:  ? ?NPRS scale:  pain at worst 4/10 at night, 0/10 upon arrival today.  ?Pain location: Lt knee  ?Pain description: intermittent sore ?Aggravating factors: on feet longer so end  of day ?Relieving factors: straightening knee ? ? ?OBJECTIVE:  ?  ?DIAGNOSTIC FINDINGS:  ?10/01/2021 ?XR 09/29/21 "Left total knee prosthesis in good position alignment without incompetent features.  No periprosthetic  ?fracture noted.  No evidence of loosening or any lucency between the bone and prosthesis. ?  ?PATIENT SURVEYS:  ?11/12/2021: FOTO update 63% ? ?10/01/21: FOTO 53% functional, Goal is 72% ?  ?MUSCLE LENGTH: ?10/01/2021: Tight hamstrings and quads on Lt ?  ?  ?PALPATION: ?10/01/2021: No significant warmth or redness with palpation, TTP in quads and hamstrings ?  ?LE  ROM: ?  ? ROM ?P:passive ?A:active Left ?10/01/21 Left ?10/11/21 Left ?10/18/21 Left ?10/27/21 Left ?11/01/21 Left ?11/10/21 Left ?11/12/2021  ?Knee flexion 65 P: ?78* A:82 ?P:85 A: ?P:92* A: 87* ?seated A: 87* ?seated Supine AROM heel slide: ?94 ? ?  ?Knee extension 8 P: -6* A:-2  A: -2 ?LAQ A: -2* ?seated Seated AROM LAQ: ?-2 ?  ? (Blank rows = not tested) ?  ?LE MMT: ?  ?MMT Right ?10/01/2021 Left ?10/01/2021 Right ?11/12/2021 Left ?11/12/2021  ?Hip flexion   3    ?Hip extension        ?Hip abduction   4    ?Hip adduction        ?Hip internal rotation        ?Hip external rotation        ?Knee flexion   3    ?Knee extension   2+ 5/5 ?41.8, 37.3 lbs 5/5 ?28.7, 33 lbs  ?Ankle dorsiflexion        ?Ankle plantarflexion        ?Ankle inversion        ?Ankle eversion        ?(Blank rows = not tested) ?  ?  ?FUNCTIONAL TESTS:  ?11/12/2021: 5 x sit to stand 18 inch chair: 15 seconds s UE ? ?10/01/21: 5 times sit to stand: 18.99  must use UE's ?  ?  ?  ?TODAY'S TREATMENT: ?11/12/2021 ?Therapeutic Exercise: ?Recumbent bike rocking stretch 8 mins seat 7 ?Supine AROM heel slide Lt x 10  ?Leg extension machine eccentric lowering Lt leg 10 lbs 2 x 10 with 30 second stretch into flexion c Lt leg only after each set.  ?Seated SLR Lt x 15 c slow focus ? ? ?TherActivity ?Leg Press BLEs 106# 15 reps 2 sets with 5 sec hold flex & ext, slow controlled motion ?Step on and over 6 inch step c one hand rail Lt Leg WB x 15 ? ?Manual Therapy:  ?  Seated Lt knee flexion c IR/distraction mobilization c movement, contract/relax techniques to Lt quad for mobility improvement.  ? ?Modalities: Vaso left knee high compression 34* for 10 min intermittent prop for ext ? ?11/10/2021 ?Therapeutic Exercise: ?Recumbent bike rocking stretch 8 mins seat 7 ?Hamstring stretch SLR w/strap DF to neutral 30 sec hold 2 reps ?Gastroc stretch step heel depression 30 sec hold 2 reps ?Quad stretch hooklying supine LE over edge strap knee flexion 30 sec hold 2 reps ?Standing knee  flex stretch Lt foot  in chair 10 sec hold 10 reps ?supine rolling bil. Feet 55cm with strap assist LLE knee flexion stretch 5 sec and leg press ext 15 reps ?LAQ 5 # 2X15 on LLE c contralateral movement opposite, pause in end range flexion and extension ? ?TherActivity ?Sit to stand to sit 18 inch chair s UE assist x 5 ?Leg Press BLEs 106# 15 reps 2 sets with 5 sec hold flex &  ext, slow controlled motion ?Step up 6" step no UE support 10 reps PT demo & verbal cues on technique ?Step down 6" step single UE support 10 reps PT demo & verbal cues on technique ?Lateral step up 6 inch on Lt leg 10 without UE support PT demo & verbal cues on technique ? ?Neuromuscular Re-ed:  tandem stance on foam beam with LLE in front & LLE in back 30 sec ea.  ? ?Manual Therapy: PROM with end range stretch & contract relax agonist & antigonist ? ?Modalities: Vaso left knee high compression 34* for 10 min intermittent prop for ext ? ? ?11/01/2021 ?Therapeutic Exercise: ?Recumbent SciFit bike rocking stretch using BLEs / BUEs 8 mins seat 9 ?Hamstring stretch SLR w/strap DF to neutral 30 sec hold 2 reps ?Gastroc stretch step heel depression 30 sec hold 2 reps ?Quad stretch hooklying supine LE over edge strap knee flexion 30 sec hold 2 reps ?supine SLR x 15 Lt ?LAQ 5 # 2X15 on LLE c contralateral movement opposite, pause in end range flexion and extension ? ?  ?TherActivity ?Leg press double leg 106 lbs x 15, Lt leg only 50 lbs 2 x 15 ?Step up 4" step no UE support 10 reps PT demo & verbal cues on technique ?Step down 4" step single UE support 10 reps PT demo & verbal cues on technique ?Lateral step up 4 inch on Lt leg 10 without UE support PT demo & verbal cues on technique ? ?Manual Therapy: PROM with end range stretch & contract relax agonist & antigonist ? ?Modalities: Vaso left knee high compression 34* for 10 min intermittent prop for ext ? ? ? ?PATIENT EDUCATION:  ?Education details: HEP,POC ?Person educated: Patient ?Education method:  Explanation, Demonstration, Verbal cues, and Handouts ?Education comprehension: verbalized understanding, returned demonstration, and needs further education ?  ?  ?HOME EXERCISE PROGRAM: ?Access Code: [email or web address]

## 2021-11-13 LAB — HM MAMMOGRAPHY

## 2021-11-15 ENCOUNTER — Ambulatory Visit (INDEPENDENT_AMBULATORY_CARE_PROVIDER_SITE_OTHER): Payer: No Typology Code available for payment source | Admitting: Rehabilitative and Restorative Service Providers"

## 2021-11-15 ENCOUNTER — Other Ambulatory Visit: Payer: Self-pay

## 2021-11-15 ENCOUNTER — Encounter: Payer: Self-pay | Admitting: Rehabilitative and Restorative Service Providers"

## 2021-11-15 DIAGNOSIS — M25662 Stiffness of left knee, not elsewhere classified: Secondary | ICD-10-CM | POA: Diagnosis not present

## 2021-11-15 DIAGNOSIS — M6281 Muscle weakness (generalized): Secondary | ICD-10-CM | POA: Diagnosis not present

## 2021-11-15 DIAGNOSIS — R600 Localized edema: Secondary | ICD-10-CM | POA: Diagnosis not present

## 2021-11-15 DIAGNOSIS — M25562 Pain in left knee: Secondary | ICD-10-CM | POA: Diagnosis not present

## 2021-11-15 NOTE — Therapy (Signed)
?OUTPATIENT PHYSICAL THERAPY TREATMENT NOTE ? ? ?Patient Name: Kaitlyn Kelly ?MRN: [id number] ?DOB:09-27-1960, 61 y.o., female ?Today's Date: 11/15/2021 ? ?PCP: Maximiano Coss, NP ?REFERRING PROVIDER: Donella Stade, PA-C ? ? PT End of Session - 11/15/21 0802   ? ? Visit Number 11   ? Number of Visits 16   ? Date for PT Re-Evaluation 11/26/21   ? Authorization Type [facility]   ? Progress Note Due on Visit 21   ? PT Start Time [phone]   ? PT Stop Time [phone]   ? PT Time Calculation (min) 53 min   ? Activity Tolerance Patient tolerated treatment well   ? Behavior During Therapy [facility] for tasks assessed/performed   ? ?  ?  ? ?  ? ? ? ?Past Medical History:  ?Diagnosis Date  ? Anal fissure   ? related to [place]  ? Arthritis   ? Asthma   ? Hemorrhoids   ? History of kidney stones   ? Hypertension   ? Hypothyroidism   ? Medical history non-contributory   ? Pneumonia   ? PONV (postoperative nausea and vomiting)   ? Thyroid disease   ? ?Past Surgical History:  ?Procedure Laterality Date  ? COLONOSCOPY    ? had a 2014, 2017  ? KNEE ARTHROSCOPY WITH MENISCAL REPAIR Right 03/30/2021  ? Procedure: right knee meniscal root tear repair; arthroscopy;  Surgeon: Meredith Pel, MD;  Location: [place];  Service: Orthopedics;  Laterality: Right;  ? TOTAL ABDOMINAL HYSTERECTOMY Left 08/30/2002  ? TAH/RSO/LO cystectomy for bleeding, bilateral cysts  ? TOTAL KNEE ARTHROPLASTY Left 09/14/2021  ? Procedure: LEFT TOTAL KNEE ARTHROPLASTY;  Surgeon: Meredith Pel, MD;  Location: [place];  Service: Orthopedics;  Laterality: Left;  ? TUBAL LIGATION    ? VULVA /PERINEUM BIOPSY  03/08/2020  ?    ? ?Patient Active Problem List  ? Diagnosis Date Noted  ? S/P TKR (total knee replacement), left 09/14/2021  ? Acquired hypothyroidism 06/23/2021  ? Arthralgia of multiple joints 06/23/2021  ? Gastroesophageal reflux disease 06/23/2021  ? Localized morphea 06/23/2021  ? Lumbar back sprain 06/23/2021  ? Menopause 06/23/2021  ?  Osteoarthritis 06/23/2021  ? Acute medial meniscus tear of right knee   ? Vulvar irritation 03/04/2020  ? Lichen sclerosus 38/18/2993  ? Pain in both hands 01/17/2020  ? Vitamin D deficiency 05/20/2009  ? Allergic rhinitis 05/20/2009  ? ELEVATED BLOOD PRESSURE WITHOUT DIAGNOSIS OF HYPERTENSION 05/20/2009  ? HASHIMOTO'S THYROIDITIS 12/07/2007  ? Obesity 06/11/2007  ? Essential hypertension 06/11/2007  ? Exacerbation of asthma 06/11/2007  ? HEMATURIA, HX OF 06/11/2007  ? ? ?REFERRING DIAG: Z16.967 (ICD-10-CM) - S/P total knee arthroplasty, left ? ?THERAPY DIAG:  ?Acute pain of left knee ? ?Stiffness of left knee, not elsewhere classified ? ?Muscle weakness (generalized) ? ?Localized edema ? ?PERTINENT HISTORY: PMH: Lt TKA 09/14/21, Rt knee scope with meniscal repair 03/30/21 ? ?ONSET DATE: 09/14/21 ? ?PRECAUTIONS: None ? ?SUBJECTIVE: Pt indicated feeling similar over the weekend.  She indicated doing steps into and out of camper.  Felt going down was trouble (some anxiety noted as well).   ? ?PAIN:  ? ?NPRS scale: 0/10 upon arrival ?Pain location: Lt knee  ?Pain description: intermittent sore ?Aggravating factors: nightime ?Relieving factors: straightening knee ? ? ?OBJECTIVE:  ?  ?DIAGNOSTIC FINDINGS:  ?10/01/2021 ?XR 09/29/21 "Left total knee prosthesis in good position alignment without incompetent features.  No periprosthetic  ?fracture noted.  No evidence of loosening or  any lucency between the bone and prosthesis. ?  ?PATIENT SURVEYS:  ?11/12/2021: FOTO update 63% ? ?10/01/21: FOTO 53% functional, Goal is 72% ?  ?MUSCLE LENGTH: ?10/01/2021: Tight hamstrings and quads on Lt ?  ?  ?PALPATION: ?10/01/2021: No significant warmth or redness with palpation, TTP in quads and hamstrings ?  ?LE ROM: ?  ? ROM ?P:passive ?A:active Left ?10/01/21 Left ?10/11/21 Left ?10/18/21 Left ?10/27/21 Left ?11/01/21 Left ?11/10/21 Left ?11/12/2021  ?Knee flexion 65 P: ?78* A:82 ?P:85 A: ?P:92* A: 87* ?seated A: 87* ?seated Supine AROM heel  slide: ?94 ? ?  ?Knee extension 8 P: -6* A:-2  A: -2 ?LAQ A: -2* ?seated Seated AROM LAQ: ?-2 ?  ? (Blank rows = not tested) ?  ?LE MMT: ?  ?MMT Right ?10/01/2021 Left ?10/01/2021 Right ?11/12/2021 Left ?11/12/2021  ?Hip flexion   3    ?Hip extension        ?Hip abduction   4    ?Hip adduction        ?Hip internal rotation        ?Hip external rotation        ?Knee flexion   3    ?Knee extension   2+ 5/5 ?41.8, 37.3 lbs 5/5 ?28.7, 33 lbs  ?Ankle dorsiflexion        ?Ankle plantarflexion        ?Ankle inversion        ?Ankle eversion        ?(Blank rows = not tested) ?  ?  ?FUNCTIONAL TESTS:  ?11/12/2021: 5 x sit to stand 18 inch chair: 15 seconds s UE ? ?10/01/21: 5 times sit to stand: 18.99  must use UE's ?  ?  ?  ?TODAY'S TREATMENT: ?11/15/2021 ?Therapeutic Exercise: ?Recumbent bike rocking stretch 8 mins seat 6 ?Incilne gastroc stretch 1 min x 2 bilateral ?Leg extension machine eccentric lowering Lt leg 10 lbs 3 x 10 with 30 second stretch into flexion c Lt leg only after each set.  ? ?Neuro Re-ed ?SLS c contralateral leg slider fwd/lateral/reverse ?Lateral stepping 3 cones x 5 bilateral ? ?TherActivity ?Leg Press Lt leg only 37 lbs 2 x 15 to promote control for stair navigation ? ? ?Manual Therapy:  ?  Seated Lt knee flexion c IR/distraction mobilization c movement, contract/relax techniques to Lt quad for mobility improvement.  ? ?Modalities: Vaso left knee high compression 34* for 10 min intermittent prop for ext ? ?11/12/2021 ?Therapeutic Exercise: ?Recumbent bike rocking stretch 8 mins seat 7 ?Supine AROM heel slide Lt x 10  ?Leg extension machine eccentric lowering Lt leg 10 lbs 2 x 10 with 30 second stretch into flexion c Lt leg only after each set.  ?Seated SLR Lt x 15 c slow focus ? ? ?TherActivity ?Leg Press BLEs 106# 15 reps 2 sets with 5 sec hold flex & ext, slow controlled motion ?Step on and over 6 inch step c one hand rail Lt Leg WB x 15 ? ?Manual Therapy:  ?  Seated Lt knee flexion c IR/distraction  mobilization c movement, contract/relax techniques to Lt quad for mobility improvement.  ? ?Modalities: Vaso left knee high compression 34* for 10 min intermittent prop for ext ? ?11/10/2021 ?Therapeutic Exercise: ?Recumbent bike rocking stretch 8 mins seat 7 ?Hamstring stretch SLR w/strap DF to neutral 30 sec hold 2 reps ?Gastroc stretch step heel depression 30 sec hold 2 reps ?Quad stretch hooklying supine LE over edge strap knee flexion 30 sec hold 2 reps ?Standing  knee flex stretch Lt foot  in chair 10 sec hold 10 reps ?supine rolling bil. Feet 55cm with strap assist LLE knee flexion stretch 5 sec and leg press ext 15 reps ?LAQ 5 # 2X15 on LLE c contralateral movement opposite, pause in end range flexion and extension ? ?TherActivity ?Sit to stand to sit 18 inch chair s UE assist x 5 ?Leg Press BLEs 106# 15 reps 2 sets with 5 sec hold flex & ext, slow controlled motion ?Step up 6" step no UE support 10 reps PT demo & verbal cues on technique ?Step down 6" step single UE support 10 reps PT demo & verbal cues on technique ?Lateral step up 6 inch on Lt leg 10 without UE support PT demo & verbal cues on technique ? ?Neuromuscular Re-ed:  tandem stance on foam beam with LLE in front & LLE in back 30 sec ea.  ? ?Manual Therapy: PROM with end range stretch & contract relax agonist & antigonist ? ?Modalities: Vaso left knee high compression 34* for 10 min intermittent prop for ext ? ? ?PATIENT EDUCATION:  ?Education details: HEP,POC ?Person educated: Patient ?Education method: Explanation, Demonstration, Verbal cues, and Handouts ?Education comprehension: verbalized understanding, returned demonstration, and needs further education ?  ?  ?HOME EXERCISE PROGRAM: ?Access Code: [email or web address] ?URL: [email or web address] ?Date: 11/10/2021 ?Prepared by: Jamey Reas ? ?Exercises ?ADDED ?- Seated Hamstring Stretch with Strap  - 2 x daily - 7 x weekly - 1 sets - 2-3 reps - 30 seconds hold ?- Gastroc Stretch on Step  -  2 x daily - 7 x weekly - 1 sets - 2-3 reps - 30 seconds hold ?- Supine Quadriceps Stretch with Strap on Table  - 2 x daily - 7 x weekly - 1 sets - 2-3 reps - 30 seconds hold ?- Seated Hamstring Curl with Anchored Resistance  - 2 x

## 2021-11-17 ENCOUNTER — Encounter: Payer: Self-pay | Admitting: Rehabilitative and Restorative Service Providers"

## 2021-11-17 ENCOUNTER — Other Ambulatory Visit: Payer: Self-pay

## 2021-11-17 ENCOUNTER — Ambulatory Visit (INDEPENDENT_AMBULATORY_CARE_PROVIDER_SITE_OTHER): Payer: No Typology Code available for payment source | Admitting: Rehabilitative and Restorative Service Providers"

## 2021-11-17 DIAGNOSIS — M25562 Pain in left knee: Secondary | ICD-10-CM | POA: Diagnosis not present

## 2021-11-17 DIAGNOSIS — M6281 Muscle weakness (generalized): Secondary | ICD-10-CM

## 2021-11-17 DIAGNOSIS — R600 Localized edema: Secondary | ICD-10-CM | POA: Diagnosis not present

## 2021-11-17 DIAGNOSIS — M25662 Stiffness of left knee, not elsewhere classified: Secondary | ICD-10-CM | POA: Diagnosis not present

## 2021-11-17 NOTE — Therapy (Signed)
?OUTPATIENT PHYSICAL THERAPY TREATMENT NOTE ? ? ?Patient Name: Kaitlyn Kelly ?MRN: [id number] ?DOB:Nov 17, 1960, 61 y.o., female ?Today's Date: 11/17/2021 ? ?PCP: Maximiano Coss, NP ?REFERRING PROVIDER: Donella Stade, PA-C ? ? PT End of Session - 11/17/21 0802   ? ? Visit Number 12   ? Number of Visits 16   ? Date for PT Re-Evaluation 11/26/21   ? Authorization Type [facility]   ? Progress Note Due on Visit 21   ? PT Start Time [phone]   ? PT Stop Time 3419   ? PT Time Calculation (min) 50 min   ? Activity Tolerance Patient tolerated treatment well   ? Behavior During Therapy [facility] for tasks assessed/performed   ? ?  ?  ? ?  ? ? ? ? ?Past Medical History:  ?Diagnosis Date  ? Anal fissure   ? related to [place]  ? Arthritis   ? Asthma   ? Hemorrhoids   ? History of kidney stones   ? Hypertension   ? Hypothyroidism   ? Medical history non-contributory   ? Pneumonia   ? PONV (postoperative nausea and vomiting)   ? Thyroid disease   ? ?Past Surgical History:  ?Procedure Laterality Date  ? COLONOSCOPY    ? had a 2014, 2017  ? KNEE ARTHROSCOPY WITH MENISCAL REPAIR Right 03/30/2021  ? Procedure: right knee meniscal root tear repair; arthroscopy;  Surgeon: Meredith Pel, MD;  Location: [place];  Service: Orthopedics;  Laterality: Right;  ? TOTAL ABDOMINAL HYSTERECTOMY Left 08/30/2002  ? TAH/RSO/LO cystectomy for bleeding, bilateral cysts  ? TOTAL KNEE ARTHROPLASTY Left 09/14/2021  ? Procedure: LEFT TOTAL KNEE ARTHROPLASTY;  Surgeon: Meredith Pel, MD;  Location: [place];  Service: Orthopedics;  Laterality: Left;  ? TUBAL LIGATION    ? VULVA /PERINEUM BIOPSY  03/08/2020  ?    ? ?Patient Active Problem List  ? Diagnosis Date Noted  ? S/P TKR (total knee replacement), left 09/14/2021  ? Acquired hypothyroidism 06/23/2021  ? Arthralgia of multiple joints 06/23/2021  ? Gastroesophageal reflux disease 06/23/2021  ? Localized morphea 06/23/2021  ? Lumbar back sprain 06/23/2021  ? Menopause 06/23/2021  ?  Osteoarthritis 06/23/2021  ? Acute medial meniscus tear of right knee   ? Vulvar irritation 03/04/2020  ? Lichen sclerosus 62/22/9798  ? Pain in both hands 01/17/2020  ? Vitamin D deficiency 05/20/2009  ? Allergic rhinitis 05/20/2009  ? ELEVATED BLOOD PRESSURE WITHOUT DIAGNOSIS OF HYPERTENSION 05/20/2009  ? HASHIMOTO'S THYROIDITIS 12/07/2007  ? Obesity 06/11/2007  ? Essential hypertension 06/11/2007  ? Exacerbation of asthma 06/11/2007  ? HEMATURIA, HX OF 06/11/2007  ? ? ?REFERRING DIAG: X21.194 (ICD-10-CM) - S/P total knee arthroplasty, left ? ?THERAPY DIAG:  ?Acute pain of left knee ? ?Stiffness of left knee, not elsewhere classified ? ?Muscle weakness (generalized) ? ?Localized edema ? ?PERTINENT HISTORY: PMH: Lt TKA 09/14/21, Rt knee scope with meniscal repair 03/30/21 ? ?ONSET DATE: 09/14/21 ? ?PRECAUTIONS: None ? ?SUBJECTIVE: Pt indicated she was able to get in and out of hot tub without rails and was able to do it better than she has been.  Achy this morning.  ? ?PAIN:  ? ?NPRS scale: 2/10 ?Pain location: Lt knee  ?Pain description: achy ?Aggravating factors: nightime ?Relieving factors: moving and OTC medicine ? ? ?OBJECTIVE:  ?  ?DIAGNOSTIC FINDINGS:  ?10/01/2021 ?XR 09/29/21 "Left total knee prosthesis in good position alignment without incompetent features.  No periprosthetic  ?fracture noted.  No evidence of loosening  or any lucency between the bone and prosthesis. ?  ?PATIENT SURVEYS:  ?11/12/2021: FOTO update 63% ? ?10/01/21: FOTO 53% functional, Goal is 72% ?  ?MUSCLE LENGTH: ?10/01/2021: Tight hamstrings and quads on Lt ?  ?  ?PALPATION: ?10/01/2021: No significant warmth or redness with palpation, TTP in quads and hamstrings ?  ?LE ROM: ?  ? ROM ?P:passive ?A:active Left ?10/01/21 Left ?10/11/21 Left ?10/18/21 Left ?10/27/21 Left ?11/01/21 Left ?11/10/21 Left ?11/12/2021  ?Knee flexion 65 P: ?78* A:82 ?P:85 A: ?P:92* A: 87* ?seated A: 87* ?seated Supine AROM heel slide: ?94 ? ?  ?Knee extension 8 P: -6* A:-2  A:  -2 ?LAQ A: -2* ?seated Seated AROM LAQ: ?-2 ?  ? (Blank rows = not tested) ?  ?LE MMT: ?  ?MMT Right ?10/01/2021 Left ?10/01/2021 Right ?11/12/2021 Left ?11/12/2021  ?Hip flexion   3    ?Hip extension        ?Hip abduction   4    ?Hip adduction        ?Hip internal rotation        ?Hip external rotation        ?Knee flexion   3    ?Knee extension   2+ 5/5 ?41.8, 37.3 lbs 5/5 ?28.7, 33 lbs  ?Ankle dorsiflexion        ?Ankle plantarflexion        ?Ankle inversion        ?Ankle eversion        ?(Blank rows = not tested) ?  ?  ?FUNCTIONAL TESTS:  ?11/12/2021: 5 x sit to stand 18 inch chair: 15 seconds s UE ? ?10/01/21: 5 times sit to stand: 18.99  must use UE's ?  ?  ?  ?TODAY'S TREATMENT: ?11/17/2021 ?Therapeutic Exercise: ?Recumbent bike rocking stretch 8 mins seat 6 ?Incilne gastroc stretch 1 min x 2 bilateral ?Leg extension machine eccentric lowering Lt leg 10 lbs 3 x 10 with 45 second stretch into flexion c Lt leg only after each set.  ?Machine Hamstring curl 15 lbs 3 x 10 Lt only ? ?TherActivity ?Leg Press Lt leg only 43 lbs 2 x 15 to promote control for stair navigation ?6 inch step on over and down WB on Lt leg c one hand rail assist 2 x 10 ? ? ?Manual Therapy:  ?Supine Lt knee flexion mobilization c movement c distraction/IR.  IR G4 mobs to proximal tibia.  Patellar mobs superior/inferior g4 ? ?Modalities: Vaso left knee high compression 34* for 10 min intermittent prop for ext ? ?11/15/2021 ?Therapeutic Exercise: ?Recumbent bike rocking stretch 8 mins seat 6 ?Incilne gastroc stretch 1 min x 2 bilateral ?Leg extension machine eccentric lowering Lt leg 10 lbs 3 x 10 with 30 second stretch into flexion c Lt leg only after each set.  ? ?Neuro Re-ed ?SLS c contralateral leg slider fwd/lateral/reverse ?Lateral stepping 3 cones x 5 bilateral ? ?TherActivity ?Leg Press Lt leg only 37 lbs 2 x 15 to promote control for stair navigation ? ? ?Manual Therapy:  ?  Seated Lt knee flexion c IR/distraction mobilization c movement,  contract/relax techniques to Lt quad for mobility improvement.  ? ?Modalities: Vaso left knee high compression 34* for 10 min intermittent prop for ext ? ?11/12/2021 ?Therapeutic Exercise: ?Recumbent bike rocking stretch 8 mins seat 7 ?Supine AROM heel slide Lt x 10  ?Leg extension machine eccentric lowering Lt leg 10 lbs 2 x 10 with 30 second stretch into flexion c Lt leg only after each set.  ?  Seated SLR Lt x 15 c slow focus ? ? ?TherActivity ?Leg Press BLEs 106# 15 reps 2 sets with 5 sec hold flex & ext, slow controlled motion ?Step on and over 6 inch step c one hand rail Lt Leg WB x 15 ? ?Manual Therapy:  ?  Seated Lt knee flexion c IR/distraction mobilization c movement, contract/relax techniques to Lt quad for mobility improvement.  ? ?Modalities: Vaso left knee high compression 34* for 10 min intermittent prop for ext ? ?PATIENT EDUCATION:  ?Education details: HEP,POC ?Person educated: Patient ?Education method: Explanation, Demonstration, Verbal cues, and Handouts ?Education comprehension: verbalized understanding, returned demonstration, and needs further education ?  ?  ?HOME EXERCISE PROGRAM: ?Access Code: [email or web address] ?URL: [email or web address] ?Date: 11/10/2021 ?Prepared by: Jamey Reas ? ?Exercises ?ADDED ?- Seated Hamstring Stretch with Strap  - 2 x daily - 7 x weekly - 1 sets - 2-3 reps - 30 seconds hold ?- Gastroc Stretch on Step  - 2 x daily - 7 x weekly - 1 sets - 2-3 reps - 30 seconds hold ?- Supine Quadriceps Stretch with Strap on Table  - 2 x daily - 7 x weekly - 1 sets - 2-3 reps - 30 seconds hold ?- Seated Hamstring Curl with Anchored Resistance  - 2 x daily - 7 x weekly - 2 sets - 10 reps - 5 seconds hold ?- Standing Knee Flexion Stretch on Step  - 2 x daily - 7 x weekly - 1 sets - 10 reps - 10 seconds hold ? ?  ?  ?ASSESSMENT: ?  ?CLINICAL IMPRESSION:   ?Pt to benefit from continued strengthening to quad for improved WB control as well as continued inclusion of long duration low  load stretching into flexion to improve capsular connective tissue mobility for flexion gains.  ?  ?OBJECTIVE IMPAIRMENTS: decreased activity tolerance, difficulty walking, decreased balance, decreased endurance, decre

## 2021-11-19 ENCOUNTER — Ambulatory Visit (INDEPENDENT_AMBULATORY_CARE_PROVIDER_SITE_OTHER): Payer: No Typology Code available for payment source | Admitting: Orthopedic Surgery

## 2021-11-19 DIAGNOSIS — Z96652 Presence of left artificial knee joint: Secondary | ICD-10-CM

## 2021-11-21 ENCOUNTER — Encounter: Payer: Self-pay | Admitting: Orthopedic Surgery

## 2021-11-21 NOTE — Progress Notes (Signed)
? ?Post-Op Visit Note ?  ?Patient: Kaitlyn Kelly           ?Date of Birth: 1960/12/27           ?MRN: [id number] ?Visit Date: 11/19/2021 ?PCP: Maximiano Coss, NP ? ? ?Assessment & Plan: ? ?Chief Complaint:  ?Chief Complaint  ?Patient presents with  ? Left Knee - Wound Check  ? ?Visit Diagnoses: No diagnosis found. ? ?Plan: Patient presents for follow-up left total knee replacement performed 09/14/2021.  Had a small suture marking itself out of the proximal aspect of the incision.  That looks very good today.  Has healed up nicely.  Range of motion improving.  Gait is normalizing.  No calf tenderness negative Homans today.  Continue with physical therapy and 8-week return for final check. ? ?Follow-Up Instructions: No follow-ups on file.  ? ?Orders:  ?No orders of the defined types were placed in this encounter. ? ?No orders of the defined types were placed in this encounter. ? ? ?Imaging: ?No results found. ? ?PMFS History: ?Patient Active Problem List  ? Diagnosis Date Noted  ? S/P TKR (total knee replacement), left 09/14/2021  ? Acquired hypothyroidism 06/23/2021  ? Arthralgia of multiple joints 06/23/2021  ? Gastroesophageal reflux disease 06/23/2021  ? Localized morphea 06/23/2021  ? Lumbar back sprain 06/23/2021  ? Menopause 06/23/2021  ? Osteoarthritis 06/23/2021  ? Acute medial meniscus tear of right knee   ? Vulvar irritation 03/04/2020  ? Lichen sclerosus 40/98/1191  ? Pain in both hands 01/17/2020  ? Vitamin D deficiency 05/20/2009  ? Allergic rhinitis 05/20/2009  ? ELEVATED BLOOD PRESSURE WITHOUT DIAGNOSIS OF HYPERTENSION 05/20/2009  ? HASHIMOTO'S THYROIDITIS 12/07/2007  ? Obesity 06/11/2007  ? Essential hypertension 06/11/2007  ? Exacerbation of asthma 06/11/2007  ? HEMATURIA, HX OF 06/11/2007  ? ?Past Medical History:  ?Diagnosis Date  ? Anal fissure   ? related to [place]  ? Arthritis   ? Asthma   ? Hemorrhoids   ? History of kidney stones   ? Hypertension   ? Hypothyroidism   ? Medical  history non-contributory   ? Pneumonia   ? PONV (postoperative nausea and vomiting)   ? Thyroid disease   ?  ?Family History  ?Problem Relation Age of Onset  ? Heart disease Mother   ? Alcohol abuse Mother   ? Asthma Mother   ? Depression Mother   ? Drug abuse Mother   ? Early death Mother   ? Hypertension Mother   ? Thyroid disease Sister   ? Diabetes Sister   ? Asthma Sister   ? Drug abuse Sister   ? Hypertension Sister   ? Psoriasis Sister   ? Interstitial cystitis Sister   ? Colon cancer Neg Hx   ? Esophageal cancer Neg Hx   ? Pancreatic cancer Neg Hx   ? Prostate cancer Neg Hx   ? Rectal cancer Neg Hx   ?  ?Past Surgical History:  ?Procedure Laterality Date  ? COLONOSCOPY    ? had a 2014, 2017  ? KNEE ARTHROSCOPY WITH MENISCAL REPAIR Right 03/30/2021  ? Procedure: right knee meniscal root tear repair; arthroscopy;  Surgeon: Meredith Pel, MD;  Location: [place];  Service: Orthopedics;  Laterality: Right;  ? TOTAL ABDOMINAL HYSTERECTOMY Left 08/30/2002  ? TAH/RSO/LO cystectomy for bleeding, bilateral cysts  ? TOTAL KNEE ARTHROPLASTY Left 09/14/2021  ? Procedure: LEFT TOTAL KNEE ARTHROPLASTY;  Surgeon: Meredith Pel, MD;  Location: [place];  Service: Orthopedics;  Laterality: Left;  ? TUBAL LIGATION    ? VULVA /PERINEUM BIOPSY  03/08/2020  ?    ? ?Social History  ? ?Occupational History  ? Not on file  ?Tobacco Use  ? Smoking status: Never  ? Smokeless tobacco: Never  ?Vaping Use  ? Vaping Use: Never used  ?Substance and Sexual Activity  ? Alcohol use: Yes  ?  Alcohol/week: 1.0 - 2.0 standard drink  ?  Types: 1 - 2 Glasses of wine per week  ?  Comment: socially  ? Drug use: No  ? Sexual activity: Yes  ? ? ? ?

## 2021-11-22 ENCOUNTER — Ambulatory Visit (INDEPENDENT_AMBULATORY_CARE_PROVIDER_SITE_OTHER): Payer: No Typology Code available for payment source | Admitting: Physical Therapy

## 2021-11-22 ENCOUNTER — Encounter: Payer: Self-pay | Admitting: Physical Therapy

## 2021-11-22 DIAGNOSIS — M6281 Muscle weakness (generalized): Secondary | ICD-10-CM

## 2021-11-22 DIAGNOSIS — R600 Localized edema: Secondary | ICD-10-CM | POA: Diagnosis not present

## 2021-11-22 DIAGNOSIS — M25662 Stiffness of left knee, not elsewhere classified: Secondary | ICD-10-CM | POA: Diagnosis not present

## 2021-11-22 DIAGNOSIS — M25562 Pain in left knee: Secondary | ICD-10-CM | POA: Diagnosis not present

## 2021-11-22 NOTE — Therapy (Signed)
?OUTPATIENT PHYSICAL THERAPY TREATMENT NOTE ? ? ?Patient Name: Kaitlyn Kelly ?MRN: [id number] ?DOB:1960/12/09, 61 y.o., female ?Today's Date: 11/22/2021 ? ?PCP: Maximiano Coss, NP ?REFERRING PROVIDER: Donella Stade, PA-C ? ? PT End of Session - 11/22/21 0816   ? ? Visit Number 13   ? Number of Visits 16   ? Date for PT Re-Evaluation 11/26/21   ? Authorization Type [facility]   ? Progress Note Due on Visit 21   ? PT Start Time 0800   ? PT Stop Time 0841   ? PT Time Calculation (min) 41 min   ? Activity Tolerance Patient tolerated treatment well   ? Behavior During Therapy [facility] for tasks assessed/performed   ? ?  ?  ? ?  ? ? ? ? ?Past Medical History:  ?Diagnosis Date  ? Anal fissure   ? related to [place]  ? Arthritis   ? Asthma   ? Hemorrhoids   ? History of kidney stones   ? Hypertension   ? Hypothyroidism   ? Medical history non-contributory   ? Pneumonia   ? PONV (postoperative nausea and vomiting)   ? Thyroid disease   ? ?Past Surgical History:  ?Procedure Laterality Date  ? COLONOSCOPY    ? had a 2014, 2017  ? KNEE ARTHROSCOPY WITH MENISCAL REPAIR Right 03/30/2021  ? Procedure: right knee meniscal root tear repair; arthroscopy;  Surgeon: Meredith Pel, MD;  Location: [place];  Service: Orthopedics;  Laterality: Right;  ? TOTAL ABDOMINAL HYSTERECTOMY Left 08/30/2002  ? TAH/RSO/LO cystectomy for bleeding, bilateral cysts  ? TOTAL KNEE ARTHROPLASTY Left 09/14/2021  ? Procedure: LEFT TOTAL KNEE ARTHROPLASTY;  Surgeon: Meredith Pel, MD;  Location: [place];  Service: Orthopedics;  Laterality: Left;  ? TUBAL LIGATION    ? VULVA /PERINEUM BIOPSY  03/08/2020  ?    ? ?Patient Active Problem List  ? Diagnosis Date Noted  ? S/P TKR (total knee replacement), left 09/14/2021  ? Acquired hypothyroidism 06/23/2021  ? Arthralgia of multiple joints 06/23/2021  ? Gastroesophageal reflux disease 06/23/2021  ? Localized morphea 06/23/2021  ? Lumbar back sprain 06/23/2021  ? Menopause 06/23/2021  ?  Osteoarthritis 06/23/2021  ? Acute medial meniscus tear of right knee   ? Vulvar irritation 03/04/2020  ? Lichen sclerosus 56/38/7564  ? Pain in both hands 01/17/2020  ? Vitamin D deficiency 05/20/2009  ? Allergic rhinitis 05/20/2009  ? ELEVATED BLOOD PRESSURE WITHOUT DIAGNOSIS OF HYPERTENSION 05/20/2009  ? HASHIMOTO'S THYROIDITIS 12/07/2007  ? Obesity 06/11/2007  ? Essential hypertension 06/11/2007  ? Exacerbation of asthma 06/11/2007  ? HEMATURIA, HX OF 06/11/2007  ? ? ?REFERRING DIAG: P32.951 (ICD-10-CM) - S/P total knee arthroplasty, left ? ?THERAPY DIAG:  ?Acute pain of left knee ? ?Stiffness of left knee, not elsewhere classified ? ?Muscle weakness (generalized) ? ?Localized edema ? ?PERTINENT HISTORY: PMH: Lt TKA 09/14/21, Rt knee scope with meniscal repair 03/30/21 ? ?ONSET DATE: 09/14/21 ? ?PRECAUTIONS: None ? ?SUBJECTIVE: her knee was bothering her after walking a lot this this weekend but denies pain today upon arrival. ?PAIN:  ? ?NPRS scale: 0/10 ?Pain location: Lt knee  ?Pain description: achy ?Aggravating factors: nightime ?Relieving factors: moving and OTC medicine ? ? ?OBJECTIVE:  ?  ?DIAGNOSTIC FINDINGS:  ?10/01/2021 ?XR 09/29/21 "Left total knee prosthesis in good position alignment without incompetent features.  No periprosthetic  ?fracture noted.  No evidence of loosening or any lucency between the bone and prosthesis. ?  ?PATIENT SURVEYS:  ?11/12/2021:  FOTO update 63% ? ?10/01/21: FOTO 53% functional, Goal is 72% ?  ?MUSCLE LENGTH: ?10/01/2021: Tight hamstrings and quads on Lt ?  ?  ?PALPATION: ?10/01/2021: No significant warmth or redness with palpation, TTP in quads and hamstrings ?  ?LE ROM: ?  ? ROM ?P:passive ?A:active Left ?10/01/21 Left ?10/11/21 Left ?10/18/21 Left ?10/27/21 Left ?11/01/21 Left ?11/10/21 Left ?11/12/2021  ?Knee flexion 65 P: ?78* A:82 ?P:85 A: ?P:92* A: 87* ?seated A: 87* ?seated Supine AROM heel slide: ?94 ? ?  ?Knee extension 8 P: -6* A:-2  A: -2 ?LAQ A: -2* ?seated Seated AROM  LAQ: ?-2 ?  ? (Blank rows = not tested) ?  ?LE MMT: ?  ?MMT Right ?10/01/2021 Left ?10/01/2021 Right ?11/12/2021 Left ?11/12/2021  ?Hip flexion   3    ?Hip extension        ?Hip abduction   4    ?Hip adduction        ?Hip internal rotation        ?Hip external rotation        ?Knee flexion   3    ?Knee extension   2+ 5/5 ?41.8, 37.3 lbs 5/5 ?28.7, 33 lbs  ?Ankle dorsiflexion        ?Ankle plantarflexion        ?Ankle inversion        ?Ankle eversion        ?(Blank rows = not tested) ?  ?  ?FUNCTIONAL TESTS:  ?11/12/2021: 5 x sit to stand 18 inch chair: 15 seconds s UE ? ?10/01/21: 5 times sit to stand: 18.99  must use UE's ?  ?  ?  ?TODAY'S TREATMENT: ?11/22/2021 ?Therapeutic Exercise: ?Nu step L7 X 10 min ?Incilne gastroc stretch 30 sec X 3 bilateral ?Seated knee flexion AAROM stretch 10 sec X 10 ?Supine hamstring stretch 30 sec X 3 ?Supine quad/hip flexor stretch 30 sec X 3 ?Supine heelslides 5 sec 2X10 ? ? ?TherActivity ?Leg Press Lt leg only 50 lbs 3 x 10 to promote control for stair navigation ?6 inch step on over and down WB on Lt leg c one hand rail assist 2 x 10 ?Sit to stands 2X10 no UE support  ? ? ?Manual Therapy:  ?Supine Lt knee flexion mobilization and PROM to tolerance ? ?11/17/2021 ?Therapeutic Exercise: ?Recumbent bike rocking stretch 8 mins seat 6 ?Incilne gastroc stretch 1 min x 2 bilateral ?Leg extension machine eccentric lowering Lt leg 10 lbs 3 x 10 with 45 second stretch into flexion c Lt leg only after each set.  ?Machine Hamstring curl 15 lbs 3 x 10 Lt only ? ?TherActivity ?Leg Press Lt leg only 43 lbs 2 x 15 to promote control for stair navigation ?6 inch step on over and down WB on Lt leg c one hand rail assist 2 x 10 ? ? ?Manual Therapy:  ?Supine Lt knee flexion mobilization c movement c distraction/IR.  IR G4 mobs to proximal tibia.  Patellar mobs superior/inferior g4 ? ?Modalities: Vaso left knee high compression 34* for 10 min intermittent prop for ext ? ?11/15/2021 ?Therapeutic  Exercise: ?Recumbent bike rocking stretch 8 mins seat 6 ?Incilne gastroc stretch 1 min x 2 bilateral ?Leg extension machine eccentric lowering Lt leg 10 lbs 3 x 10 with 30 second stretch into flexion c Lt leg only after each set.  ? ?Neuro Re-ed ?SLS c contralateral leg slider fwd/lateral/reverse ?Lateral stepping 3 cones x 5 bilateral ? ?TherActivity ?Leg Press Lt leg only 37 lbs 2  x 15 to promote control for stair navigation ? ? ?Manual Therapy:  ?  Seated Lt knee flexion c IR/distraction mobilization c movement, contract/relax techniques to Lt quad for mobility improvement.  ? ?Modalities: Vaso left knee high compression 34* for 10 min intermittent prop for ext ? ?11/12/2021 ?Therapeutic Exercise: ?Recumbent bike rocking stretch 8 mins seat 7 ?Supine AROM heel slide Lt x 10  ?Leg extension machine eccentric lowering Lt leg 10 lbs 2 x 10 with 30 second stretch into flexion c Lt leg only after each set.  ?Seated SLR Lt x 15 c slow focus ? ? ?TherActivity ?Leg Press BLEs 106# 15 reps 2 sets with 5 sec hold flex & ext, slow controlled motion ?Step on and over 6 inch step c one hand rail Lt Leg WB x 15 ? ?Manual Therapy:  ?  Seated Lt knee flexion c IR/distraction mobilization c movement, contract/relax techniques to Lt quad for mobility improvement.  ? ?Modalities: Vaso left knee high compression 34* for 10 min intermittent prop for ext ? ?PATIENT EDUCATION:  ?Education details: HEP,POC ?Person educated: Patient ?Education method: Explanation, Demonstration, Verbal cues, and Handouts ?Education comprehension: verbalized understanding, returned demonstration, and needs further education ?  ?  ?HOME EXERCISE PROGRAM: ?Access Code: [email or web address] ?URL: [email or web address] ?Date: 11/10/2021 ?Prepared by: Jamey Reas ? ?Exercises ?ADDED ?- Seated Hamstring Stretch with Strap  - 2 x daily - 7 x weekly - 1 sets - 2-3 reps - 30 seconds hold ?- Gastroc Stretch on Step  - 2 x daily - 7 x weekly - 1 sets - 2-3 reps -  30 seconds hold ?- Supine Quadriceps Stretch with Strap on Table  - 2 x daily - 7 x weekly - 1 sets - 2-3 reps - 30 seconds hold ?- Seated Hamstring Curl with Anchored Resistance  - 2 x daily - 7 x weekly - 2 sets - 10 reps - 5 seconds

## 2021-11-24 ENCOUNTER — Encounter: Payer: No Typology Code available for payment source | Admitting: Physical Therapy

## 2021-12-14 ENCOUNTER — Other Ambulatory Visit (HOSPITAL_COMMUNITY): Payer: Self-pay

## 2021-12-14 ENCOUNTER — Other Ambulatory Visit: Payer: Self-pay | Admitting: Surgical

## 2021-12-14 MED ORDER — GABAPENTIN 300 MG PO CAPS
300.0000 mg | ORAL_CAPSULE | Freq: Three times a day (TID) | ORAL | 0 refills | Status: DC
  Filled 2021-12-14: qty 30, 10d supply, fill #0

## 2021-12-15 ENCOUNTER — Other Ambulatory Visit (HOSPITAL_COMMUNITY): Payer: Self-pay

## 2021-12-16 ENCOUNTER — Other Ambulatory Visit (HOSPITAL_COMMUNITY): Payer: Self-pay

## 2022-01-04 ENCOUNTER — Encounter: Payer: Self-pay | Admitting: Registered Nurse

## 2022-01-11 ENCOUNTER — Other Ambulatory Visit (HOSPITAL_COMMUNITY): Payer: Self-pay

## 2022-01-21 ENCOUNTER — Ambulatory Visit (INDEPENDENT_AMBULATORY_CARE_PROVIDER_SITE_OTHER): Payer: No Typology Code available for payment source | Admitting: Surgical

## 2022-01-21 ENCOUNTER — Encounter: Payer: Self-pay | Admitting: Orthopedic Surgery

## 2022-01-21 DIAGNOSIS — Z96652 Presence of left artificial knee joint: Secondary | ICD-10-CM

## 2022-01-21 NOTE — Progress Notes (Signed)
Post-Op Visit Note   Patient: Kaitlyn Kelly           Date of Birth: 08-Sep-1960           MRN: [id number] Visit Date: 01/21/2022 PCP: Maximiano Coss, NP   Assessment & Plan:  Chief Complaint:  Chief Complaint  Patient presents with   Left Knee - Follow-up    left total knee replacement performed 09/14/2021   Visit Diagnoses:  1. S/P total knee arthroplasty, left     Plan: Patient is a 61 year old female who presents s/p left total knee arthroplasty on 09/14/2021.  She states that she is progressing well.  She has finished physical therapy and is continue with a home exercise program.  She is able to care for her bees and garden.  She is doing stairs at work and on her camper without difficulty.  Main concern is difficulty sleeping at night due to the knee stiffness.  Takes Robaxin and gabapentin as well as magnesium oxide at night.  Does not have to take any ibuprofen or opioid medication.  On exam, she has 0 degrees extension to 100 degrees of knee flexion.  Incision is well-healed.  No evidence of infection or dehiscence.  No effusion.  Excellent quad strength rated 5/5.  No calf tenderness.  Plan is continue with home exercise program and follow-up as needed.  Antibiotic dental prophylaxis discussed.  Follow-Up Instructions: No follow-ups on file.   Orders:  No orders of the defined types were placed in this encounter.  No orders of the defined types were placed in this encounter.   Imaging: No results found.  PMFS History: Patient Active Problem List   Diagnosis Date Noted   S/P TKR (total knee replacement), left 09/14/2021   Acquired hypothyroidism 06/23/2021   Arthralgia of multiple joints 06/23/2021   Gastroesophageal reflux disease 06/23/2021   Localized morphea 06/23/2021   Lumbar back sprain 06/23/2021   Menopause 06/23/2021   Osteoarthritis 06/23/2021   Acute medial meniscus tear of right knee    Vulvar irritation 93/81/8299   Lichen sclerosus 37/16/9678    Pain in both hands 01/17/2020   Vitamin D deficiency 05/20/2009   Allergic rhinitis 05/20/2009   ELEVATED BLOOD PRESSURE WITHOUT DIAGNOSIS OF HYPERTENSION 05/20/2009   HASHIMOTO'S THYROIDITIS 12/07/2007   Obesity 06/11/2007   Essential hypertension 06/11/2007   Exacerbation of asthma 06/11/2007   HEMATURIA, HX OF 06/11/2007   Past Medical History:  Diagnosis Date   Anal fissure    related to [place] Sclerosis   Arthritis    Asthma    Hemorrhoids    History of kidney stones    Hypertension    Hypothyroidism    Medical history non-contributory    Pneumonia    PONV (postoperative nausea and vomiting)    Thyroid disease     Family History  Problem Relation Age of Onset   Heart disease Mother    Alcohol abuse Mother    Asthma Mother    Depression Mother    Drug abuse Mother    Early death Mother    Hypertension Mother    Thyroid disease Sister    Diabetes Sister    Asthma Sister    Drug abuse Sister    Hypertension Sister    Psoriasis Sister    Interstitial cystitis Sister    Colon cancer Neg Hx    Esophageal cancer Neg Hx    Pancreatic cancer Neg Hx    Prostate cancer Neg Hx  Rectal cancer Neg Hx     Past Surgical History:  Procedure Laterality Date   COLONOSCOPY     had a 2014, 2017   KNEE ARTHROSCOPY WITH MENISCAL REPAIR Right 03/30/2021   Procedure: right knee meniscal root tear repair; arthroscopy;  Surgeon: Meredith Pel, MD;  Location: [place];  Service: Orthopedics;  Laterality: Right;   TOTAL ABDOMINAL HYSTERECTOMY Left 08/30/2002   TAH/RSO/LO cystectomy for bleeding, bilateral cysts   TOTAL KNEE ARTHROPLASTY Left 09/14/2021   Procedure: LEFT TOTAL KNEE ARTHROPLASTY;  Surgeon: Meredith Pel, MD;  Location: [place];  Service: Orthopedics;  Laterality: Left;   TUBAL LIGATION     VULVA [place] BIOPSY  03/08/2020       Social History   Occupational History   Not on file  Tobacco Use   Smoking status: Never   Smokeless tobacco: Never   Vaping Use   Vaping Use: Never used  Substance and Sexual Activity   Alcohol use: Yes    Alcohol/week: 1.0 - 2.0 standard drink of alcohol    Types: 1 - 2 Glasses of wine per week    Comment: socially   Drug use: No   Sexual activity: Yes

## 2022-02-14 ENCOUNTER — Encounter: Payer: Self-pay | Admitting: Family Medicine

## 2022-02-14 ENCOUNTER — Other Ambulatory Visit: Payer: Self-pay | Admitting: Surgical

## 2022-02-15 ENCOUNTER — Other Ambulatory Visit (HOSPITAL_COMMUNITY): Payer: Self-pay

## 2022-02-15 MED ORDER — METHOCARBAMOL 500 MG PO TABS
500.0000 mg | ORAL_TABLET | Freq: Three times a day (TID) | ORAL | 2 refills | Status: DC | PRN
  Filled 2022-02-15: qty 30, 10d supply, fill #0
  Filled 2022-03-23: qty 30, 10d supply, fill #1
  Filled 2022-04-20: qty 30, 10d supply, fill #2

## 2022-02-23 ENCOUNTER — Ambulatory Visit: Payer: No Typology Code available for payment source | Admitting: Registered Nurse

## 2022-02-25 ENCOUNTER — Ambulatory Visit: Payer: No Typology Code available for payment source | Admitting: Registered Nurse

## 2022-02-25 ENCOUNTER — Ambulatory Visit (INDEPENDENT_AMBULATORY_CARE_PROVIDER_SITE_OTHER): Payer: No Typology Code available for payment source | Admitting: Family Medicine

## 2022-02-25 ENCOUNTER — Encounter: Payer: Self-pay | Admitting: Family Medicine

## 2022-02-25 VITALS — BP 126/80 | HR 91 | Temp 97.7°F | Resp 17 | Ht 62.5 in | Wt 214.2 lb

## 2022-02-25 DIAGNOSIS — I10 Essential (primary) hypertension: Secondary | ICD-10-CM

## 2022-02-25 DIAGNOSIS — E559 Vitamin D deficiency, unspecified: Secondary | ICD-10-CM

## 2022-02-25 DIAGNOSIS — Z0000 Encounter for general adult medical examination without abnormal findings: Secondary | ICD-10-CM | POA: Insufficient documentation

## 2022-02-25 LAB — HEPATIC FUNCTION PANEL
ALT: 11 U/L (ref 0–35)
AST: 15 U/L (ref 0–37)
Albumin: 4.3 g/dL (ref 3.5–5.2)
Alkaline Phosphatase: 77 U/L (ref 39–117)
Bilirubin, Direct: 0.1 mg/dL (ref 0.0–0.3)
Total Bilirubin: 0.3 mg/dL (ref 0.2–1.2)
Total Protein: 7.7 g/dL (ref 6.0–8.3)

## 2022-02-25 LAB — CBC WITH DIFFERENTIAL/PLATELET
Basophils Absolute: 0 10*3/uL (ref 0.0–0.1)
Basophils Relative: 0.3 % (ref 0.0–3.0)
Eosinophils Absolute: 0.2 10*3/uL (ref 0.0–0.7)
Eosinophils Relative: 1.7 % (ref 0.0–5.0)
HCT: 40.9 % (ref 36.0–46.0)
Hemoglobin: 12.9 g/dL (ref 12.0–15.0)
Lymphocytes Relative: 20.2 % (ref 12.0–46.0)
Lymphs Abs: 1.9 10*3/uL (ref 0.7–4.0)
MCHC: 31.6 g/dL (ref 30.0–36.0)
MCV: 79.6 fl (ref 78.0–100.0)
Monocytes Absolute: 0.4 10*3/uL (ref 0.1–1.0)
Monocytes Relative: 3.8 % (ref 3.0–12.0)
Neutro Abs: 7 10*3/uL (ref 1.4–7.7)
Neutrophils Relative %: 74 % (ref 43.0–77.0)
Platelets: 325 10*3/uL (ref 150.0–400.0)
RBC: 5.13 Mil/uL — ABNORMAL HIGH (ref 3.87–5.11)
RDW: 16.3 % — ABNORMAL HIGH (ref 11.5–15.5)
WBC: 9.5 10*3/uL (ref 4.0–10.5)

## 2022-02-25 LAB — BASIC METABOLIC PANEL
BUN: 22 mg/dL (ref 6–23)
CO2: 28 mEq/L (ref 19–32)
Calcium: 9.2 mg/dL (ref 8.4–10.5)
Chloride: 102 mEq/L (ref 96–112)
Creatinine, Ser: 1.06 mg/dL (ref 0.40–1.20)
GFR: 57.46 mL/min — ABNORMAL LOW (ref >60.00)
Glucose, Bld: 141 mg/dL — ABNORMAL HIGH (ref 70–99)
Potassium: 3.7 mEq/L (ref 3.5–5.1)
Sodium: 138 mEq/L (ref 135–145)

## 2022-02-25 LAB — TSH: TSH: 3.6 u[IU]/mL (ref 0.35–5.50)

## 2022-02-25 LAB — LIPID PANEL
Cholesterol: 164 mg/dL (ref 0–200)
HDL: 58.4 mg/dL (ref >39.00)
LDL Cholesterol: 84 mg/dL (ref 0–99)
NonHDL: 105.34
Total CHOL/HDL Ratio: 3
Triglycerides: 105 mg/dL (ref 0.0–149.0)
VLDL: 21 mg/dL (ref 0.0–40.0)

## 2022-02-25 LAB — VITAMIN D 25 HYDROXY (VIT D DEFICIENCY, FRACTURES): VITD: 64.54 ng/mL (ref 30.00–100.00)

## 2022-02-25 NOTE — Progress Notes (Signed)
   Subjective:    Patient ID: Kaitlyn Kelly, female    DOB: February 06, 1961, 61 y.o.   MRN: [id number]  HPI CPE- UTD on mammo, colonoscopy, Tdap.  No concerns today.  Health Maintenance  Topic Date Due   INFLUENZA VACCINE  02/15/2022   HIV Screening  08/20/2022 (Originally 07[birth year]14/1977)   PAP SMEAR-Modifier  07/18/2022   MAMMOGRAM  11/19/2023   COLONOSCOPY (Pts 45-60yr Insurance coverage will need to be confirmed)  09/16/2025   TETANUS/TDAP  03/25/2026   HPV VACCINES  Aged Out   COVID-19 Vaccine  Discontinued   Hepatitis C Screening  Discontinued   Zoster Vaccines- Shingrix  Discontinued      Review of Systems Patient reports no vision/ hearing changes, adenopathy,fever, weight change,  persistant/recurrent hoarseness , swallowing issues, chest pain, palpitations, edema, persistant/recurrent cough, hemoptysis, dyspnea (rest/exertional/paroxysmal nocturnal), gastrointestinal bleeding (melena, rectal bleeding), abdominal pain, significant heartburn, bowel changes, GU symptoms (dysuria, hematuria, incontinence), Gyn symptoms (abnormal  bleeding, pain),  syncope, focal weakness, memory loss, numbness & tingling, skin/hair/nail changes, abnormal bruising or bleeding, anxiety, or depression.     Objective:   Physical Exam General Appearance:    Alert, cooperative, no distress, appears stated age  Head:    Normocephalic, without obvious abnormality, atraumatic  Eyes:    PERRL, conjunctiva/corneas clear, EOM's intact both eyes  Ears:    Normal TM's and external ear canals, both ears  Nose:   Nares normal, septum midline, mucosa normal, no drainage    or sinus tenderness  Throat:   Lips, mucosa, and tongue normal; teeth and gums normal  Neck:   Supple, symmetrical, trachea midline, no adenopathy;    Thyroid: no enlargement/tenderness/nodules  Back:     Symmetric, no curvature, ROM normal, no CVA tenderness  Lungs:     Clear to auscultation bilaterally, respirations unlabored  Chest Wall:     No tenderness or deformity   Heart:    Regular rate and rhythm, S1 and S2 normal, no murmur, rub   or gallop  Breast Exam:    Deferred to GYN  Abdomen:     Soft, non-tender, bowel sounds active all four quadrants,    no masses, no organomegaly  Genitalia:    Deferred to GYN  Rectal:    Extremities:   Extremities normal, atraumatic, no cyanosis or edema  Pulses:   2+ and symmetric all extremities  Skin:   Skin color, texture, turgor normal, no rashes or lesions  Lymph nodes:   Cervical, supraclavicular, and axillary nodes normal  Neurologic:   CNII-XII intact, normal strength, sensation and reflexes    throughout          Assessment & Plan:

## 2022-02-25 NOTE — Assessment & Plan Note (Signed)
Pt's PE WNL w/ exception of BMI.  UTD on mammo, colonoscopy, Tdap.  Check labs.  Anticipatory guidance provided.  

## 2022-02-25 NOTE — Assessment & Plan Note (Signed)
Chronic problem.  Well controlled today.  Check labs.  No anticipated med changes.

## 2022-02-25 NOTE — Patient Instructions (Signed)
Establish with a new provider and plan for follow up in 6 months We'll notify you of your lab results and make any changes if needed Continue to work on healthy diet and regular exercise- you can do it! Call with any questions or concerns Stay Safe!  Stay Healthy! Enjoy the rest of your summer!!!

## 2022-02-25 NOTE — Assessment & Plan Note (Signed)
Check labs and replete prn. 

## 2022-02-28 NOTE — Progress Notes (Signed)
Attempted to reach the pt . Call can not be completed and no option to leave a  VM

## 2022-03-06 ENCOUNTER — Encounter: Payer: Self-pay | Admitting: Emergency Medicine

## 2022-03-06 ENCOUNTER — Ambulatory Visit
Admission: EM | Admit: 2022-03-06 | Discharge: 2022-03-06 | Disposition: A | Discharge status: Home / Self Care | Payer: No Typology Code available for payment source | Attending: Nurse Practitioner | Admitting: Nurse Practitioner

## 2022-03-06 DIAGNOSIS — R21 Rash and other nonspecific skin eruption: Secondary | ICD-10-CM | POA: Diagnosis not present

## 2022-03-06 MED ORDER — MOMETASONE FUROATE 0.1 % EX CREA
1.0000 | TOPICAL_CREAM | Freq: Two times a day (BID) | CUTANEOUS | 0 refills | Status: DC
Start: 2022-03-06 — End: 2022-06-24

## 2022-03-06 MED ORDER — PREDNISONE 20 MG PO TABS: 40.0000 mg | ORAL_TABLET | Freq: Every day | ORAL | 0 refills | Status: AC

## 2022-03-06 MED ORDER — VALACYCLOVIR HCL 1 G PO TABS: 1000.0000 mg | ORAL_TABLET | Freq: Three times a day (TID) | ORAL | 0 refills | Status: AC

## 2022-03-06 NOTE — ED Provider Notes (Signed)
[facility]    CSN: [id number] Arrival date & time: 03/06/22  0807      History   Chief Complaint No chief complaint on file.   HPI Kaitlyn Kelly is a 61 y.o. female.   The history is provided by the patient.   Patient presents with a rash to the inner right upper thigh that has been present for the past 2 days.  Patient states rash is painful, and symptoms have worsened over the past 2 days.  She thought that the rash started as "bug bites".  But she states over the last 2 days they have changed to a flat red and painful rash.  Pain is located only on the right upper inner thigh.  She denies fever, chills, oozing, or drainage.  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  States that she tried to use clobetasol with minimal improvement to her symptoms.  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   Acquired hypothyroidism 06/23/2021   Arthralgia of multiple joints 06/23/2021   Gastroesophageal reflux disease 06/23/2021   Lumbar back sprain 06/23/2021   Menopause 06/23/2021   Osteoarthritis 06/23/2021   Acute medial meniscus tear of right knee    Vulvar irritation 35/32/9924   Lichen sclerosus 26/83/4196   Pain in both hands 01/17/2020   Vitamin D deficiency 05/20/2009   Allergic rhinitis 05/20/2009   ELEVATED BLOOD PRESSURE WITHOUT DIAGNOSIS OF HYPERTENSION 05/20/2009   HASHIMOTO'S THYROIDITIS 12/07/2007   Obesity 06/11/2007   Essential hypertension 06/11/2007   Exacerbation of asthma 06/11/2007    HEMATURIA, HX OF 06/11/2007    Past Surgical History:  Procedure Laterality Date   COLONOSCOPY     had a 2014, 2017   KNEE ARTHROSCOPY WITH MENISCAL REPAIR Right 03/30/2021   Procedure: right knee meniscal root tear repair; arthroscopy;  Surgeon: Meredith Pel, MD;  Location: [facility];  Service: Orthopedics;  Laterality: Right;   TOTAL ABDOMINAL HYSTERECTOMY Left 08/30/2002   TAH/RSO/LO cystectomy for bleeding, bilateral cysts   TOTAL KNEE ARTHROPLASTY Left 09/14/2021   Procedure: LEFT TOTAL KNEE ARTHROPLASTY;  Surgeon: Meredith Pel, MD;  Location: [facility];  Service: Orthopedics;  Laterality: Left;   TUBAL LIGATION     VULVA /PERINEUM BIOPSY  03/08/2020        OB History     Gravida  1   Para  1   Term      Preterm      AB      Living  1      SAB      IAB      Ectopic      Multiple      Live Births  1        Obstetric Comments  SVD x 1          Home Medications    Prior to Admission medications   Medication Sig Start Date End  Date Taking? Authorizing Provider  mometasone (ELOCON) 0.1 % cream Apply 1 Application topically 2 (two) times daily. 03/06/22  Yes Ivor Kishi-Warren, Alda Lea, NP  predniSONE (DELTASONE) 20 MG tablet Take 2 tablets (40 mg total) by mouth daily with breakfast for 5 days. 03/06/22 03/11/22 Yes Laquinta Hazell-Warren, Alda Lea, NP  valACYclovir (VALTREX) 1000 MG tablet Take 1 tablet (1,000 mg total) by mouth 3 (three) times daily for 7 days. 03/06/22 03/13/22 Yes Rayli Wiederhold-Warren, Alda Lea, NP  albuterol (VENTOLIN HFA) 108 (90 Base) MCG/ACT inhaler INHALE 2 PUFFS INTO THE LUNGS AS NEEDED EVERY 4 HOURS Patient taking differently: Inhale 2 puffs into the lungs every 4 (four) hours as needed (Asthma). 08/20/21   Maximiano Coss, NP  aspirin 81 MG chewable tablet Chew 1 tablet (81 mg total) by mouth 2 (two) times daily. 09/16/21   Meredith Pel, MD  clobetasol cream (TEMOVATE) 1.61 % Apply 1 application topically daily as needed (lichen  sclerosus).    [provider]  docusate sodium (COLACE) 100 MG capsule Take 200 mg by mouth in the morning.    [provider]  fluticasone-salmeterol (ADVAIR) 100-50 MCG/ACT AEPB 1 puff 2 (two) times daily as needed (Asthma). 06/17/19   [provider]  gabapentin (NEURONTIN) 300 MG capsule Take 1 capsule (300 mg total) by mouth 3 (three) times daily. 12/14/21   Magnant, Charles L, PA-C  ibuprofen (ADVIL) 800 MG tablet Take 1 tablet (800 mg total) by mouth every 12 (twelve) hours as needed. 09/16/21   Meredith Pel, MD  levothyroxine (SYNTHROID) 100 MCG tablet Take 1 tablet (100 mcg total) by mouth daily before breakfast. 08/20/21   Maximiano Coss, NP  lisinopril-hydrochlorothiazide (ZESTORETIC) 20-25 MG tablet TAKE 1 TABLET BY MOUTH DAILY. 08/20/21 08/20/22  Maximiano Coss, NP  methocarbamol (ROBAXIN) 500 MG tablet Take 1 tablet (500 mg total) by mouth every 8 (eight) hours as needed. 02/15/22   Magnant, Gerrianne Scale, PA-C    Family History Family History  Problem Relation Age of Onset   Heart disease Mother    Alcohol abuse Mother    Asthma Mother    Depression Mother    Drug abuse Mother    Early death Mother    Hypertension Mother    Thyroid disease Sister    Diabetes Sister    Asthma Sister    Drug abuse Sister    Hypertension Sister    Psoriasis Sister    Interstitial cystitis Sister    Colon cancer Neg Hx    Esophageal cancer Neg Hx    Pancreatic cancer Neg Hx    Prostate cancer Neg Hx    Rectal cancer Neg Hx     Social History Social History   Tobacco Use   Smoking status: Never   Smokeless tobacco: Never  Vaping Use   Vaping Use: Never used  Substance Use Topics   Alcohol use: Yes    Alcohol/week: 1.0 - 2.0 standard drink of alcohol    Types: 1 - 2 Glasses of wine per week    Comment: socially   Drug use: No     Allergies   Shingrix [zoster vac recomb adjuvanted] and Sulfonamide derivatives   Review of Systems Review of  Systems Per HPI  Physical Exam Triage Vital Signs ED Triage Vitals  Enc Vitals Group     BP 03/06/22 0812 (!) 144/84     Pulse Rate 03/06/22 0812 77     Resp 03/06/22 0812 18     Temp 03/06/22 0960  98.1 F (36.7 C)     Temp Source 03/06/22 0812 Oral     SpO2 03/06/22 0812 96 %     Weight --      Height --      Head Circumference --      Peak Flow --      Pain Score 03/06/22 0813 5     Pain Loc --      Pain Edu? --      Excl. in [place]? --    No data found.  Updated Vital Signs BP (!) 144/84 (BP Location: Right Arm)   Pulse 77   Temp 98.1 F (36.7 C) (Oral)   Resp 18   SpO2 96%   Visual Acuity Right Eye Distance:   Left Eye Distance:   Bilateral Distance:    Right Eye Near:   Left Eye Near:    Bilateral Near:     Physical Exam Vitals and nursing note reviewed.  Constitutional:      Appearance: Normal appearance.  Cardiovascular:     Rate and Rhythm: Normal rate and regular rhythm.     Pulses: Normal pulses.     Heart sounds: Normal heart sounds.  Pulmonary:     Effort: Pulmonary effort is normal.     Breath sounds: Normal breath sounds.  Abdominal:     General: Bowel sounds are normal.     Palpations: Abdomen is soft.  Skin:    General: Skin is warm and dry.     Capillary Refill: Capillary refill takes less than 2 seconds.     Findings: Rash present.     Comments: 3 large areas of erythema noted to the right inner upper thigh immediately posterior to the right groin.  Areas are flat with ill-defined borders, areas are tender to palpation.  There is no oozing, blistering, crusting, fluctuance or drainage present.  Neurological:     General: No focal deficit present.     Mental Status: She is alert and oriented to person, place, and time.  Psychiatric:        Mood and Affect: Mood normal.        Behavior: Behavior normal.      UC Treatments / Results  Labs (all labs ordered are listed, but only abnormal results are displayed) Labs Reviewed - No data  to display  EKG   Radiology No results found.  Procedures Procedures (including critical care time)  Medications Ordered in UC Medications - No data to display  Initial Impression / Assessment and Plan / UC Course  I have reviewed the triage vital signs and the nursing notes.  Pertinent labs & imaging results that were available during my care of the patient were reviewed by me and considered in my medical decision making (see chart for details).  Patient presents with rash of the present for the past 2 days.  On exam, the rash is flat, and erythematous, there is no blistering, oozing, or drainage present.  Rash is localized to the right side only.  Differential diagnoses include lichens sclerosis, versus shingles.  Given the characteristics of both rashes, difficult to determine the cause of her symptoms at this time.  As result, we will treat patient with both valacyclovir and mometasone topical steroid, long with oral prednisone..  Supportive care recommendations were provided to the patient.  Patient was advised to follow-up with her primary care physician if symptoms fail to improve. Final Clinical Impressions(s) / UC Diagnoses   Final diagnoses:  Rash  and nonspecific skin eruption     Discharge Instructions      Take medication as prescribed. May also take over-the-counter Zyrtec or Benadryl to help with itching. Avoid hot baths or showers while symptoms persist.  Recommend taking lukewarm baths. May apply cool cloths to the area to help with itching or discomfort. Avoid scratching, rubbing, or manipulating the areas while symptoms persist. Follow up with your PCP if symptoms do not improve.      ED Prescriptions     Medication Sig Dispense Auth. Provider   valACYclovir (VALTREX) 1000 MG tablet Take 1 tablet (1,000 mg total) by mouth 3 (three) times daily for 7 days. 21 tablet Valla Pacey-Warren, Alda Lea, NP   mometasone (ELOCON) 0.1 % cream Apply 1 Application  topically 2 (two) times daily. 45 g Jerlyn Pain-Warren, Alda Lea, NP   predniSONE (DELTASONE) 20 MG tablet Take 2 tablets (40 mg total) by mouth daily with breakfast for 5 days. 10 tablet Loney Peto-Warren, Alda Lea, NP      PDMP not reviewed this encounter.   Tish Men, NP 03/06/22 [phone]

## 2022-03-06 NOTE — Discharge Instructions (Signed)
Take medication as prescribed. May also take over-the-counter Zyrtec or Benadryl to help with itching. Avoid hot baths or showers while symptoms persist.  Recommend taking lukewarm baths. May apply cool cloths to the area to help with itching or discomfort. Avoid scratching, rubbing, or manipulating the areas while symptoms persist. Follow up with your PCP if symptoms do not improve.

## 2022-03-06 NOTE — ED Triage Notes (Signed)
Rash on inner right thigh since Friday.  States rash is painful.  Has tried benadryl without relief.

## 2022-03-15 IMAGING — CR DG KNEE 1-2V*L*
1 series · 1 of 1 positions shown · non-contrast
Comparison: 08/16/2021

CLINICAL DATA: Evaluate for missing needle

EXAM:
LEFT KNEE - 1-2 VIEW

[AP]
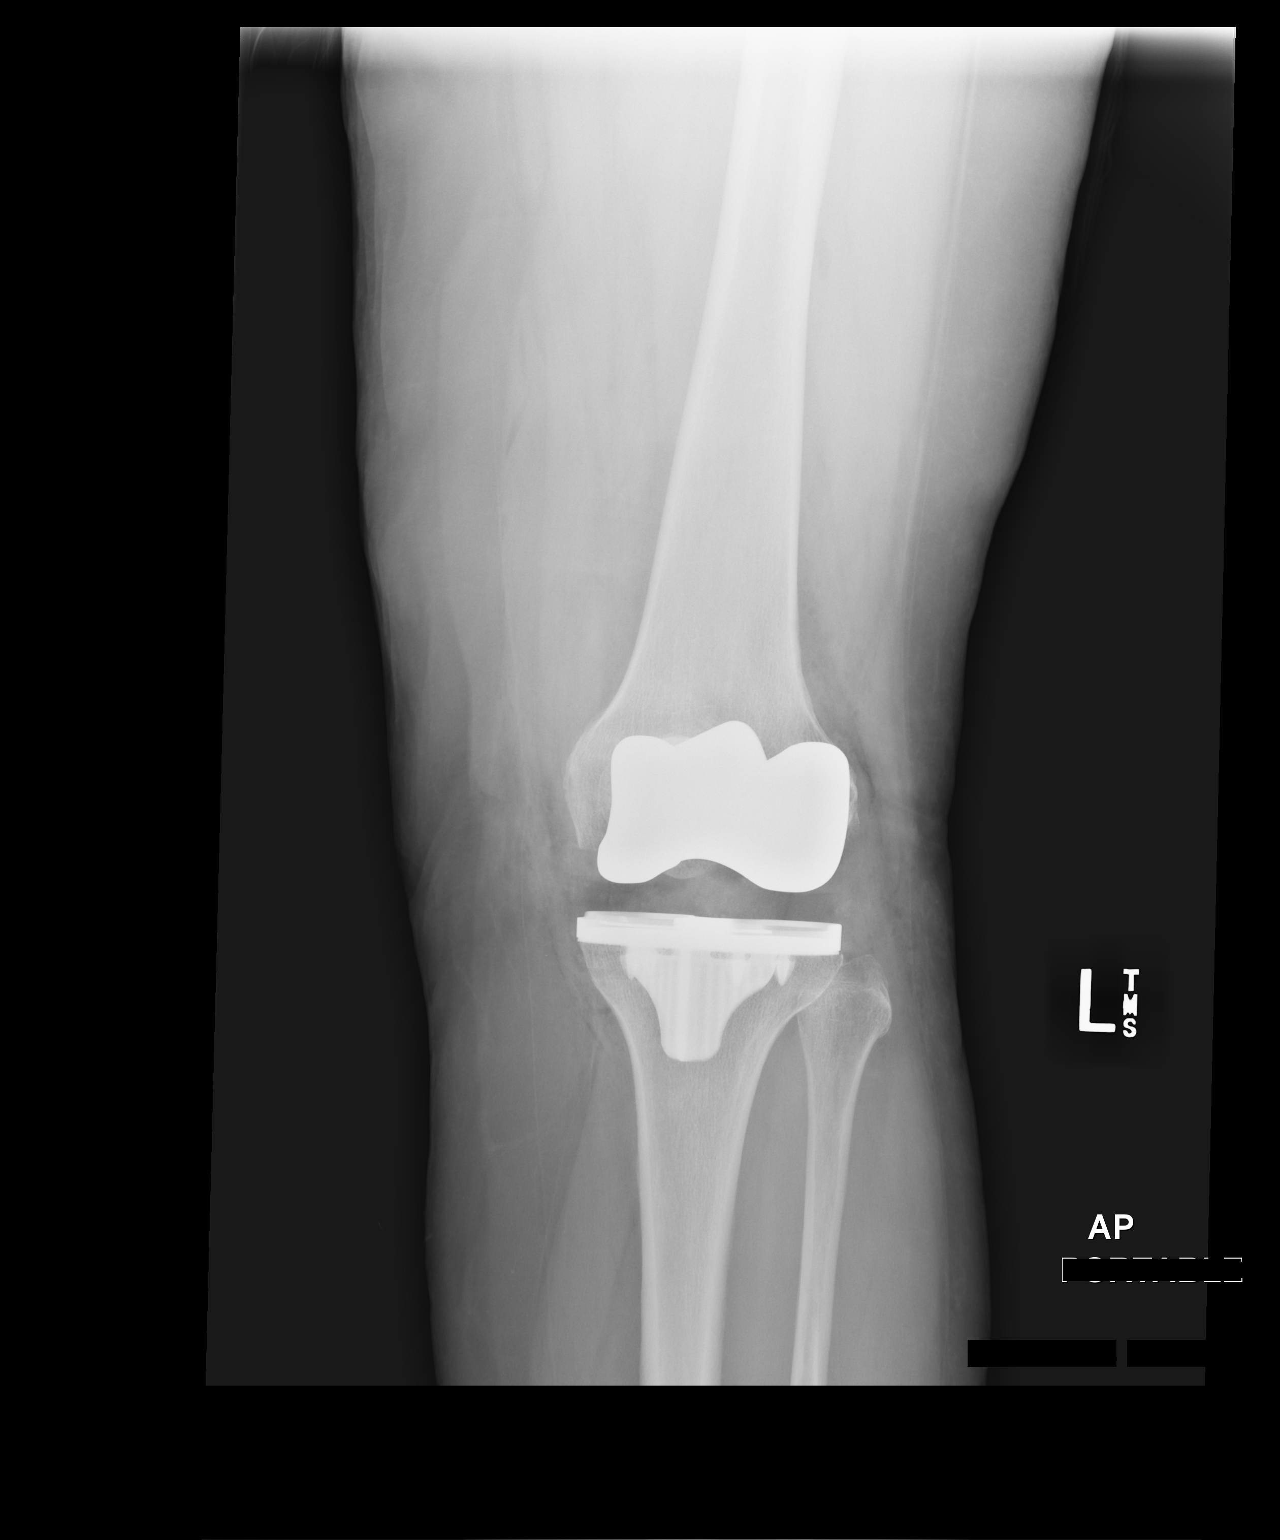

[1 of 1 positions shown; findings below may reference images not displayed]

FINDINGS: Postop change from left total knee arthroplasty. No underlying
periprosthetic fracture or dislocation. No retained metallic foreign
body identified to explain the missing surgical needle.
IMPRESSION: No retained metallic foreign body identified to account for the
missing surgical needle.

Results were called by telephone at the time of interpretation on
09/14/2021 at [DATE] to OR [HOSPITAL]-[phone], and staff member
Chou verbally acknowledged these results.

## 2022-03-23 ENCOUNTER — Encounter: Payer: Self-pay | Admitting: Family Medicine

## 2022-03-23 ENCOUNTER — Ambulatory Visit (INDEPENDENT_AMBULATORY_CARE_PROVIDER_SITE_OTHER): Payer: No Typology Code available for payment source | Admitting: Family Medicine

## 2022-03-23 ENCOUNTER — Other Ambulatory Visit (HOSPITAL_COMMUNITY): Payer: Self-pay

## 2022-03-23 VITALS — BP 120/78 | HR 84 | Temp 97.9°F | Resp 16 | Ht 62.0 in | Wt 214.4 lb

## 2022-03-23 DIAGNOSIS — R21 Rash and other nonspecific skin eruption: Secondary | ICD-10-CM

## 2022-03-23 MED ORDER — FLUCONAZOLE 150 MG PO TABS
ORAL_TABLET | ORAL | 0 refills | Status: DC
  Filled 2022-03-23: qty 2, 3d supply, fill #0

## 2022-03-23 MED ORDER — CLOTRIMAZOLE-BETAMETHASONE 1-0.05 % EX CREA
1.0000 | TOPICAL_CREAM | Freq: Every day | CUTANEOUS | 1 refills | Status: DC
  Filled 2022-03-23: qty 45, 30d supply, fill #0
  Filled 2022-04-26: qty 45, 30d supply, fill #1

## 2022-03-23 NOTE — Patient Instructions (Signed)
Follow up as needed START the Lotrisone cream twice daily Take the Diflucan today and then repeat in 3 days Cool compresses and air out as much as possible We'll call you with your dermatology appt.  These can take quite awhile.  My hope is that all will be better and you can cancel Call with any questions or concerns Hang in there!!

## 2022-03-23 NOTE — Progress Notes (Signed)
   Subjective:    Patient ID: Kaitlyn Kelly, female    DOB: 09-29-1960, 61 y.o.   MRN: [id number]  HPI Rash- 'i think I have plaque psoriasis'.  Pt reports painful rash of inner thighs bilaterally.  Sxs started weeks ago.  Went to UC and was told she had either lichen sclerosis or shingles.  No relief w/ Mometasone cream, no change w/ Valtrex.  Mild relief w/ prednisone.  Pt reports area is very painful.  Having a hard time wearing pants.  R leg > L leg.  Not spreading but not improving.   Review of Systems For ROS see HPI     Objective:   Physical Exam Vitals reviewed.  Constitutional:      General: She is not in acute distress.    Appearance: Normal appearance. She is obese. She is not ill-appearing.  Skin:    General: Skin is warm and dry.     Findings: Erythema and rash (papular areas on B inner thighs- some are well demarcated, some confluent.  + satellite lesions.  no fluctuance or drainage, very TTP.) present.  Neurological:     General: No focal deficit present.     Mental Status: She is alert and oriented to person, place, and time.  Psychiatric:        Mood and Affect: Mood normal.        Behavior: Behavior normal.        Thought Content: Thought content normal.           Assessment & Plan:  Groin rash- new.  Would be strange place to have first manifestation of plaque psoriasis.  Suspect this is more of a fungal dermatitis.  Very red, very painful but no evidence of superimposed infection.  Start topical Lotrisone cream.  Oral Diflucan x2 doses.  Refer to Derm.  Reviewed supportive care and red flags that should prompt return.  Pt expressed understanding and is in agreement w/ plan.

## 2022-03-24 ENCOUNTER — Other Ambulatory Visit: Payer: Self-pay

## 2022-03-24 ENCOUNTER — Other Ambulatory Visit: Payer: Self-pay | Admitting: Family Medicine

## 2022-03-24 ENCOUNTER — Other Ambulatory Visit (HOSPITAL_COMMUNITY): Payer: Self-pay

## 2022-03-24 MED ORDER — FLUTICASONE-SALMETEROL 100-50 MCG/ACT IN AEPB
1.0000 | INHALATION_SPRAY | Freq: Two times a day (BID) | RESPIRATORY_TRACT | 1 refills | Status: DC | PRN
  Filled 2022-03-24: qty 60, 30d supply, fill #0

## 2022-03-29 ENCOUNTER — Other Ambulatory Visit (HOSPITAL_COMMUNITY): Payer: Self-pay

## 2022-03-31 ENCOUNTER — Other Ambulatory Visit (HOSPITAL_COMMUNITY): Payer: Self-pay

## 2022-03-31 MED ORDER — DOXYCYCLINE HYCLATE 100 MG PO CAPS
100.0000 mg | ORAL_CAPSULE | Freq: Two times a day (BID) | ORAL | 1 refills | Status: DC
  Filled 2022-03-31: qty 28, 14d supply, fill #0
  Filled 2022-04-20: qty 28, 14d supply, fill #1

## 2022-04-01 ENCOUNTER — Other Ambulatory Visit (HOSPITAL_COMMUNITY): Payer: Self-pay

## 2022-04-01 MED ORDER — ALBUTEROL SULFATE HFA 108 (90 BASE) MCG/ACT IN AERS
2.0000 | INHALATION_SPRAY | RESPIRATORY_TRACT | 0 refills | Status: DC | PRN
  Filled 2022-04-01: qty 6.7, 17d supply, fill #0

## 2022-04-20 ENCOUNTER — Other Ambulatory Visit (HOSPITAL_COMMUNITY): Payer: Self-pay

## 2022-04-26 ENCOUNTER — Other Ambulatory Visit (HOSPITAL_COMMUNITY): Payer: Self-pay

## 2022-05-06 ENCOUNTER — Other Ambulatory Visit (HOSPITAL_COMMUNITY): Payer: Self-pay

## 2022-05-06 MED ORDER — TERBINAFINE HCL 250 MG PO TABS
250.0000 mg | ORAL_TABLET | Freq: Every day | ORAL | 0 refills | Status: DC
Start: 2022-05-06 — End: 2022-06-24
  Filled 2022-05-06: qty 14, 14d supply, fill #0

## 2022-05-23 ENCOUNTER — Other Ambulatory Visit (HOSPITAL_COMMUNITY): Payer: Self-pay

## 2022-05-23 MED ORDER — VTAMA 1 % EX CREA
TOPICAL_CREAM | CUTANEOUS | 2 refills | Status: DC
  Filled 2022-05-23: qty 60, 30d supply, fill #0

## 2022-05-27 ENCOUNTER — Other Ambulatory Visit (HOSPITAL_COMMUNITY): Payer: Self-pay

## 2022-05-27 MED ORDER — TACROLIMUS 0.1 % EX OINT
TOPICAL_OINTMENT | CUTANEOUS | 2 refills | Status: DC
  Filled 2022-05-27: qty 60, 30d supply, fill #0
  Filled 2022-07-05: qty 60, 30d supply, fill #1
  Filled 2022-07-06: qty 60, 30d supply, fill #0
  Filled 2022-09-29: qty 60, 30d supply, fill #1

## 2022-06-24 ENCOUNTER — Encounter: Payer: Self-pay | Admitting: Family Medicine

## 2022-06-24 ENCOUNTER — Other Ambulatory Visit (HOSPITAL_COMMUNITY): Payer: Self-pay

## 2022-06-24 ENCOUNTER — Ambulatory Visit (INDEPENDENT_AMBULATORY_CARE_PROVIDER_SITE_OTHER): Payer: No Typology Code available for payment source | Admitting: Family Medicine

## 2022-06-24 VITALS — BP 100/76 | HR 110 | Temp 98.0°F | Ht 62.0 in | Wt 221.4 lb

## 2022-06-24 DIAGNOSIS — I10 Essential (primary) hypertension: Secondary | ICD-10-CM

## 2022-06-24 DIAGNOSIS — M542 Cervicalgia: Secondary | ICD-10-CM

## 2022-06-24 DIAGNOSIS — E039 Hypothyroidism, unspecified: Secondary | ICD-10-CM

## 2022-06-24 MED ORDER — CYCLOBENZAPRINE HCL 10 MG PO TABS
10.0000 mg | ORAL_TABLET | Freq: Three times a day (TID) | ORAL | 2 refills | Status: DC | PRN
  Filled 2022-06-24: qty 30, 10d supply, fill #0
  Filled 2022-08-01: qty 30, 10d supply, fill #1
  Filled 2022-09-12: qty 30, 10d supply, fill #2

## 2022-06-24 MED ORDER — MELOXICAM 15 MG PO TABS
15.0000 mg | ORAL_TABLET | Freq: Every day | ORAL | 2 refills | Status: DC
  Filled 2022-06-24: qty 30, 30d supply, fill #0
  Filled 2022-08-01: qty 30, 30d supply, fill #1
  Filled 2022-09-29: qty 30, 30d supply, fill #2

## 2022-06-24 NOTE — Progress Notes (Signed)
Established Patient Office Visit  Subjective   Patient ID: Kaitlyn Kelly, female    DOB: July 02, 1961  Age: 61 y.o. MRN: [id number]  Chief Complaint  Patient presents with  . Establish Care    Patient is here to transfer her care. She has been seen by Dr. Birdie Riddle, and also Maximiano Coss previously.   Patient reports she has a history of HTN and hypothyroidism. Reports she has been on her medication for several years, she just recently had bloodwork done. Reviewed TSH and CMP.  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 Is going back to see the specialist in January.   Patient is reporting acute neck pain, states that it is mostly on the left side of her neck. States that it started about a month ago after she went to the beach and slept on a different mattress. States that since then she just hasn't been able to get rid of it. States that sometimes she will do ok but then other times she wakes up with severe neck pain, decreased range of motion. Has been taking tylenol, motrin, and using icy hot. No radiculopathy, no numbness or tingling down the left arm.   Current Outpatient Medications  Medication Instructions  . albuterol (VENTOLIN HFA) 108 (90 Base) MCG/ACT inhaler 2 puffs, Inhalation, Every 4 hours PRN  . aspirin 81 mg, Oral, 2 times daily  . clobetasol cream (TEMOVATE) 2.29 % 1 application , Topical, Daily PRN  . cyclobenzaprine (FLEXERIL) 10 mg, Oral, 3 times daily PRN  . docusate sodium (COLACE) 200 mg, Oral, Every morning  . levothyroxine (SYNTHROID) 100 mcg, Oral, Daily before breakfast  . lisinopril-hydrochlorothiazide (ZESTORETIC) 20-25 MG tablet TAKE 1 TABLET BY MOUTH DAILY.  . meloxicam (MOBIC) 15 mg, Oral, Daily  . tacrolimus (PROTOPIC) 0.1 % ointment Apply to affected areas daily    Patient Active Problem List   Diagnosis Date Noted  . Physical exam 02/25/2022  . S/P  TKR (total knee replacement), left 09/14/2021  . Acquired hypothyroidism 06/23/2021  . Arthralgia of multiple joints 06/23/2021  . Gastroesophageal reflux disease 06/23/2021  . Lumbar back sprain 06/23/2021  . Menopause 06/23/2021  . Osteoarthritis 06/23/2021  . Acute medial meniscus tear of right knee   . Vulvar irritation 03/04/2020  . Lichen sclerosus 79/89/2119  . Pain in both hands 01/17/2020  . Vitamin D deficiency 05/20/2009  . Allergic rhinitis 05/20/2009  . ELEVATED BLOOD PRESSURE WITHOUT DIAGNOSIS OF HYPERTENSION 05/20/2009  . HASHIMOTO'S THYROIDITIS 12/07/2007  . Obesity 06/11/2007  . Essential hypertension 06/11/2007  . Asthma 06/11/2007  . HEMATURIA, HX OF 06/11/2007      Review of Systems  All other systems reviewed and are negative.     Objective:     BP 100/76 (BP Location: Left Arm, Patient Position: Sitting, Cuff Size: Large)   Pulse (!) 110   Temp 98 F (36.7 C) (Oral)   Ht '5\' 2"'$  (1.575 m)   Wt 221 lb 6.4 oz (100.4 kg)   SpO2 98%   BMI 40.49 kg/m  {Vitals History (Optional):23777}  Physical Exam Vitals reviewed.  Constitutional:      Appearance: Normal appearance. She is well-groomed and normal weight.  Eyes:     Conjunctiva/sclera: Conjunctivae normal.  Neck:     Thyroid: No thyromegaly.  Cardiovascular:     Rate and Rhythm: Normal rate and regular rhythm.     Pulses: Normal pulses.  Heart sounds: S1 normal and S2 normal.  Pulmonary:     Effort: Pulmonary effort is normal.     Breath sounds: Normal breath sounds and air entry.  Abdominal:     General: Bowel sounds are normal.  Musculoskeletal:     Right lower leg: No edema.     Left lower leg: No edema.  Neurological:     Mental Status: She is alert and oriented to person, place, and time. Mental status is at baseline.     Gait: Gait is intact.  Psychiatric:        Mood and Affect: Mood and affect normal.        Speech: Speech normal.        Behavior: Behavior normal.         Judgment: Judgment normal.     No results found for any visits on 06/24/22.  {Labs (Optional):23779}  The 10-year ASCVD risk score (Arnett DK, et al., 2019) is: 2%    Assessment & Plan:   Problem List Items Addressed This Visit       Unprioritized   Essential hypertension - Primary    HTN -- BP in office performed and is well controlled. She  reports no side effects to the medications, no chest pain, SOB, dizziness or headaches. She has a BP cuff at home and is checking BP regularly, reports they are in the normal range.   Current hypertension medications:       Sig   lisinopril-hydrochlorothiazide (ZESTORETIC) 20-25 MG tablet (Taking) TAKE 1 TABLET BY MOUTH DAILY.              Acquired hypothyroidism   Other Visit Diagnoses     Acute neck pain       Relevant Medications   cyclobenzaprine (FLEXERIL) 10 MG tablet   meloxicam (MOBIC) 15 MG tablet       Return in about 8 months (around 02/27/2023) for Annual Physical Exam, will need blood work that day. Farrel Conners, MD

## 2022-06-24 NOTE — Assessment & Plan Note (Addendum)
HTN -- BP in office performed and is well controlled. She  reports no side effects to the medications, no chest pain, SOB, dizziness or headaches. She has a BP cuff at home and is checking BP regularly, reports they are in the normal range.   Current hypertension medications:       Sig   lisinopril-hydrochlorothiazide (ZESTORETIC) 20-25 MG tablet (Taking) TAKE 1 TABLET BY MOUTH DAILY.

## 2022-06-27 NOTE — Assessment & Plan Note (Signed)
Reviewed her TSH from August which was WNL. Will continue her current dose of 100 mcg levothyroxine daily.

## 2022-07-05 ENCOUNTER — Other Ambulatory Visit: Payer: Self-pay | Admitting: Registered Nurse

## 2022-07-05 ENCOUNTER — Other Ambulatory Visit: Payer: Self-pay | Admitting: Family Medicine

## 2022-07-06 ENCOUNTER — Other Ambulatory Visit: Payer: Self-pay

## 2022-07-06 ENCOUNTER — Other Ambulatory Visit (HOSPITAL_COMMUNITY): Payer: Self-pay

## 2022-07-06 MED ORDER — CLOBETASOL PROPIONATE 0.05 % EX CREA
1.0000 | TOPICAL_CREAM | Freq: Every day | CUTANEOUS | 5 refills | Status: AC | PRN
  Filled 2022-07-06: qty 30, 10d supply, fill #0
  Filled 2023-05-11: qty 30, 10d supply, fill #1

## 2022-07-08 ENCOUNTER — Other Ambulatory Visit (HOSPITAL_COMMUNITY): Payer: Self-pay

## 2022-07-12 ENCOUNTER — Other Ambulatory Visit (HOSPITAL_COMMUNITY): Payer: Self-pay

## 2022-08-26 ENCOUNTER — Ambulatory Visit: Payer: No Typology Code available for payment source | Admitting: Family Medicine

## 2022-08-26 DIAGNOSIS — H52223 Regular astigmatism, bilateral: Secondary | ICD-10-CM | POA: Diagnosis not present

## 2022-08-26 DIAGNOSIS — H2513 Age-related nuclear cataract, bilateral: Secondary | ICD-10-CM | POA: Diagnosis not present

## 2022-08-26 DIAGNOSIS — H35033 Hypertensive retinopathy, bilateral: Secondary | ICD-10-CM | POA: Diagnosis not present

## 2022-08-26 DIAGNOSIS — H04123 Dry eye syndrome of bilateral lacrimal glands: Secondary | ICD-10-CM | POA: Diagnosis not present

## 2022-08-26 DIAGNOSIS — H5213 Myopia, bilateral: Secondary | ICD-10-CM | POA: Diagnosis not present

## 2022-08-26 DIAGNOSIS — H524 Presbyopia: Secondary | ICD-10-CM | POA: Diagnosis not present

## 2022-08-26 DIAGNOSIS — H0288B Meibomian gland dysfunction left eye, upper and lower eyelids: Secondary | ICD-10-CM | POA: Diagnosis not present

## 2022-09-29 ENCOUNTER — Other Ambulatory Visit: Payer: Self-pay | Admitting: Registered Nurse

## 2022-09-29 DIAGNOSIS — I10 Essential (primary) hypertension: Secondary | ICD-10-CM

## 2022-09-29 DIAGNOSIS — E063 Autoimmune thyroiditis: Secondary | ICD-10-CM

## 2022-09-30 ENCOUNTER — Other Ambulatory Visit: Payer: Self-pay

## 2022-10-04 ENCOUNTER — Other Ambulatory Visit (HOSPITAL_COMMUNITY): Payer: Self-pay

## 2022-10-11 ENCOUNTER — Other Ambulatory Visit: Payer: Self-pay | Admitting: Registered Nurse

## 2022-10-11 DIAGNOSIS — I10 Essential (primary) hypertension: Secondary | ICD-10-CM

## 2022-10-11 DIAGNOSIS — E063 Autoimmune thyroiditis: Secondary | ICD-10-CM

## 2022-10-12 ENCOUNTER — Other Ambulatory Visit (HOSPITAL_COMMUNITY): Payer: Self-pay

## 2022-10-19 ENCOUNTER — Other Ambulatory Visit: Payer: Self-pay | Admitting: Registered Nurse

## 2022-10-19 DIAGNOSIS — E063 Autoimmune thyroiditis: Secondary | ICD-10-CM

## 2022-10-19 DIAGNOSIS — I10 Essential (primary) hypertension: Secondary | ICD-10-CM

## 2022-10-21 ENCOUNTER — Other Ambulatory Visit (HOSPITAL_COMMUNITY): Payer: Self-pay

## 2022-10-24 ENCOUNTER — Encounter: Payer: Self-pay | Admitting: Family Medicine

## 2022-10-24 ENCOUNTER — Other Ambulatory Visit: Payer: Self-pay | Admitting: Family Medicine

## 2022-10-24 ENCOUNTER — Other Ambulatory Visit (HOSPITAL_COMMUNITY): Payer: Self-pay

## 2022-10-24 DIAGNOSIS — I10 Essential (primary) hypertension: Secondary | ICD-10-CM

## 2022-10-24 DIAGNOSIS — E063 Autoimmune thyroiditis: Secondary | ICD-10-CM

## 2022-10-24 MED ORDER — LISINOPRIL-HYDROCHLOROTHIAZIDE 20-25 MG PO TABS
1.0000 | ORAL_TABLET | Freq: Every day | ORAL | 3 refills | Status: DC
  Filled 2022-10-24: qty 90, 90d supply, fill #0
  Filled 2023-01-22: qty 90, 90d supply, fill #1
  Filled 2023-05-11: qty 90, 90d supply, fill #2
  Filled 2023-08-16: qty 90, 90d supply, fill #3

## 2022-10-24 MED ORDER — LEVOTHYROXINE SODIUM 100 MCG PO TABS
100.0000 ug | ORAL_TABLET | Freq: Every day | ORAL | 3 refills | Status: DC
  Filled 2022-10-24: qty 90, 90d supply, fill #0
  Filled 2023-01-22: qty 90, 90d supply, fill #1

## 2022-11-21 DIAGNOSIS — Z1231 Encounter for screening mammogram for malignant neoplasm of breast: Secondary | ICD-10-CM | POA: Diagnosis not present

## 2022-11-21 LAB — HM MAMMOGRAPHY

## 2022-11-22 ENCOUNTER — Encounter: Payer: Self-pay | Admitting: Family Medicine

## 2023-01-25 ENCOUNTER — Other Ambulatory Visit: Payer: Self-pay | Admitting: Oncology

## 2023-01-25 DIAGNOSIS — Z006 Encounter for examination for normal comparison and control in clinical research program: Secondary | ICD-10-CM

## 2023-02-02 ENCOUNTER — Other Ambulatory Visit
Admission: RE | Admit: 2023-02-02 | Discharge: 2023-02-02 | Disposition: A | Discharge status: Home / Self Care | Payer: Self-pay | Attending: Oncology | Admitting: Oncology

## 2023-02-02 DIAGNOSIS — Z006 Encounter for examination for normal comparison and control in clinical research program: Secondary | ICD-10-CM | POA: Insufficient documentation

## 2023-02-17 ENCOUNTER — Encounter: Payer: Self-pay | Admitting: Family Medicine

## 2023-02-28 ENCOUNTER — Ambulatory Visit (INDEPENDENT_AMBULATORY_CARE_PROVIDER_SITE_OTHER): Payer: 59 | Admitting: Family Medicine

## 2023-02-28 ENCOUNTER — Other Ambulatory Visit (HOSPITAL_COMMUNITY): Payer: Self-pay

## 2023-02-28 ENCOUNTER — Telehealth: Payer: Self-pay | Admitting: *Deleted

## 2023-02-28 ENCOUNTER — Encounter: Payer: Self-pay | Admitting: Family Medicine

## 2023-02-28 VITALS — BP 122/92 | HR 65 | Temp 97.8°F | Ht 65.0 in | Wt 225.4 lb

## 2023-02-28 DIAGNOSIS — E039 Hypothyroidism, unspecified: Secondary | ICD-10-CM | POA: Diagnosis not present

## 2023-02-28 DIAGNOSIS — E559 Vitamin D deficiency, unspecified: Secondary | ICD-10-CM | POA: Diagnosis not present

## 2023-02-28 DIAGNOSIS — J45909 Unspecified asthma, uncomplicated: Secondary | ICD-10-CM | POA: Diagnosis not present

## 2023-02-28 DIAGNOSIS — L408 Other psoriasis: Secondary | ICD-10-CM | POA: Diagnosis not present

## 2023-02-28 DIAGNOSIS — Z13220 Encounter for screening for lipoid disorders: Secondary | ICD-10-CM

## 2023-02-28 DIAGNOSIS — Z114 Encounter for screening for human immunodeficiency virus [HIV]: Secondary | ICD-10-CM

## 2023-02-28 DIAGNOSIS — I10 Essential (primary) hypertension: Secondary | ICD-10-CM | POA: Diagnosis not present

## 2023-02-28 DIAGNOSIS — Z0000 Encounter for general adult medical examination without abnormal findings: Secondary | ICD-10-CM | POA: Diagnosis not present

## 2023-02-28 LAB — COMPREHENSIVE METABOLIC PANEL
ALT: 13 U/L (ref 0–35)
AST: 19 U/L (ref 0–37)
Albumin: 4.5 g/dL (ref 3.5–5.2)
Alkaline Phosphatase: 71 U/L (ref 39–117)
BUN: 18 mg/dL (ref 6–23)
CO2: 27 mEq/L (ref 19–32)
Calcium: 9.7 mg/dL (ref 8.4–10.5)
Chloride: 101 mEq/L (ref 96–112)
Creatinine, Ser: 1.03 mg/dL (ref 0.40–1.20)
GFR: 59.05 mL/min — ABNORMAL LOW (ref >60.00)
Glucose, Bld: 92 mg/dL (ref 70–99)
Potassium: 3.6 mEq/L (ref 3.5–5.1)
Sodium: 137 mEq/L (ref 135–145)
Total Bilirubin: 0.4 mg/dL (ref 0.2–1.2)
Total Protein: 8 g/dL (ref 6.0–8.3)

## 2023-02-28 LAB — LIPID PANEL
Cholesterol: 178 mg/dL (ref 0–200)
HDL: 51.3 mg/dL (ref >39.00)
LDL Cholesterol: 101 mg/dL — ABNORMAL HIGH (ref 0–99)
NonHDL: 126.87
Total CHOL/HDL Ratio: 3
Triglycerides: 128 mg/dL (ref 0.0–149.0)
VLDL: 25.6 mg/dL (ref 0.0–40.0)

## 2023-02-28 LAB — TSH: TSH: 5.07 u[IU]/mL (ref 0.35–5.50)

## 2023-02-28 LAB — VITAMIN D 25 HYDROXY (VIT D DEFICIENCY, FRACTURES): VITD: 101.51 ng/mL (ref 30.00–100.00)

## 2023-02-28 MED ORDER — ALBUTEROL SULFATE HFA 108 (90 BASE) MCG/ACT IN AERS
2.0000 | INHALATION_SPRAY | RESPIRATORY_TRACT | 0 refills | Status: DC | PRN
  Filled 2023-02-28: qty 6.7, 17d supply, fill #0

## 2023-02-28 NOTE — Telephone Encounter (Signed)
CRITICAL VALUE STICKER  CRITICAL VALUE: vitamin D-101.51  RECEIVER (on-site recipient of call): Ronnald Collum  DATE & TIME NOTIFIED:  02/28/2023 at 4:18pm  MESSENGER (representative from lab):Janina Mayo  MD NOTIFIED: Dr Casimiro Needle  TIME OF NOTIFICATION:4:18pm  RESPONSE:

## 2023-02-28 NOTE — Progress Notes (Signed)
Complete physical exam  Patient: Kaitlyn Kelly   DOB: 07-23-1960   61 y.o. Female  MRN: [id number]  Subjective:    Chief Complaint  Patient presents with   Annual Exam    Kaitlyn Kelly is a 62 y.o. female who presents today for a complete physical exam. She reports consuming a general diet. Home exercise routine includes patient gardens weekly, sometimes walks her dogs, takes the stairs at work. She generally feels well. She reports sleeping fairly well. She does not have additional problems to discuss today.    Most recent fall risk assessment:    03/23/2022    8:47 AM  Fall Risk   Falls in the past year? 0  Risk for fall due to : No Fall Risks     Most recent depression screenings:    02/28/2023    8:17 AM 06/24/2022    2:39 PM  PHQ 2/9 Scores  PHQ - 2 Score 0 0  PHQ- 9 Score 3 0    Vision:Within last year and Dental: No current dental problems and Receives regular dental care  Patient Active Problem List   Diagnosis Date Noted   Physical exam 02/25/2022   S/P TKR (total knee replacement), left 09/14/2021   Acquired hypothyroidism 06/23/2021   Arthralgia of multiple joints 06/23/2021   Gastroesophageal reflux disease 06/23/2021   Lumbar back sprain 06/23/2021   Menopause 06/23/2021   Osteoarthritis 06/23/2021   Acute medial meniscus tear of right knee    Vulvar irritation 03/04/2020   Lichen sclerosus 03/04/2020   Pain in both hands 01/17/2020   Vitamin D deficiency 05/20/2009   Allergic rhinitis 05/20/2009   ELEVATED BLOOD PRESSURE WITHOUT DIAGNOSIS OF HYPERTENSION 05/20/2009   HASHIMOTO'S THYROIDITIS 12/07/2007   Obesity 06/11/2007   Essential hypertension 06/11/2007   Asthma 06/11/2007   HEMATURIA, HX OF 06/11/2007      Patient Care Team: Karie Georges, MD as PCP - General (Family Medicine)   Outpatient Medications Prior to Visit  Medication Sig   aspirin 81 MG chewable tablet Chew 1 tablet (81 mg total) by mouth 2 (two) times daily.    clobetasol cream (TEMOVATE) 0.05 % Apply 1 Application topically daily as needed (lichen sclerosus).   cyclobenzaprine (FLEXERIL) 10 MG tablet Take 1 tablet (10 mg total) by mouth 3 (three) times daily as needed for muscle spasms.   docusate sodium (COLACE) 100 MG capsule Take 200 mg by mouth in the morning.   levothyroxine (SYNTHROID) 100 MCG tablet Take 1 tablet (100 mcg total) by mouth daily before breakfast.   lisinopril-hydrochlorothiazide (ZESTORETIC) 20-25 MG tablet Take 1 tablet by mouth daily.   meloxicam (MOBIC) 15 MG tablet Take 1 tablet (15 mg total) by mouth daily.   tacrolimus (PROTOPIC) 0.1 % ointment Apply to affected areas daily   [DISCONTINUED] albuterol (VENTOLIN HFA) 108 (90 Base) MCG/ACT inhaler Inhale 2 puffs into the lungs every 4 (four) hours as needed.   No facility-administered medications prior to visit.    Review of Systems  HENT:  Negative for hearing loss.   Eyes:  Negative for blurred vision.  Respiratory:  Negative for shortness of breath.   Cardiovascular:  Negative for chest pain.  Gastrointestinal: Negative.   Genitourinary: Negative.   Musculoskeletal:  Negative for back pain.  Neurological:  Negative for headaches.  Psychiatric/Behavioral:  Negative for depression.   All other systems reviewed and are negative.      Objective:     BP (!) 122/92 (BP Location:  Right Arm, Patient Position: Sitting, Cuff Size: Large)   Pulse 65   Temp 97.8 F (36.6 C) (Oral)   Ht 5\' 5"  (1.651 m)   Wt 225 lb 6.4 oz (102.2 kg)   SpO2 97%   BMI 37.51 kg/m    Physical Exam Vitals reviewed.  Constitutional:      Appearance: Normal appearance. She is well-groomed. She is obese.  HENT:     Right Ear: Tympanic membrane and ear canal normal.     Left Ear: Tympanic membrane and ear canal normal.     Mouth/Throat:     Mouth: Mucous membranes are moist.     Pharynx: No posterior oropharyngeal erythema.  Eyes:     Conjunctiva/sclera: Conjunctivae normal.  Neck:      Thyroid: No thyromegaly.  Cardiovascular:     Rate and Rhythm: Normal rate and regular rhythm.     Pulses: Normal pulses.     Heart sounds: S1 normal and S2 normal.  Pulmonary:     Effort: Pulmonary effort is normal.     Breath sounds: Normal breath sounds and air entry.  Abdominal:     General: Abdomen is flat. Bowel sounds are normal.     Palpations: Abdomen is soft.  Musculoskeletal:     Right lower leg: No edema.     Left lower leg: No edema.  Lymphadenopathy:     Cervical: No cervical adenopathy.  Neurological:     Mental Status: She is alert and oriented to person, place, and time. Mental status is at baseline.     Gait: Gait is intact.  Psychiatric:        Mood and Affect: Mood and affect normal.        Speech: Speech normal.        Behavior: Behavior normal.        Judgment: Judgment normal.      No results found for any visits on 02/28/23.     Assessment & Plan:    Routine Health Maintenance and Physical Exam  Immunization History  Administered Date(s) Administered   Influenza Split 05/01/2012, 03/25/2014, 06/24/2016, 04/08/2019   Influenza Whole 04/18/2007, 05/13/2009   Influenza,inj,Quad PF,6+ Mos 05/01/2020, 04/30/2021   MMR 11/24/2017   Moderna SARS-COV2 Booster Vaccination 06/11/2021   PFIZER(Purple Top)SARS-COV-2 Vaccination 07/10/2019, 07/31/2019, 04/24/2020   Pneumococcal Conjugate-13 03/25/2016   Pneumococcal Polysaccharide-23 06/01/2012   Td 04/17/2006   Tdap 03/25/2016   Zoster Recombinant(Shingrix) 12/17/2019   Zoster, Live 01/03/2020    Health Maintenance  Topic Date Due   HIV Screening  Never done   INFLUENZA VACCINE  02/16/2023   MAMMOGRAM  11/20/2024   Colonoscopy  09/16/2025   DTaP/Tdap/Td (3 - Td or Tdap) 03/25/2026   HPV VACCINES  Aged Out   COVID-19 Vaccine  Discontinued   Hepatitis C Screening  Discontinued   Zoster Vaccines- Shingrix  Discontinued    Discussed health benefits of physical activity, and encouraged her to  engage in regular exercise appropriate for her age and condition.  Essential hypertension -     Comprehensive metabolic panel  Acquired hypothyroidism -     TSH  Encounter for screening for HIV -     HIV Antibody (routine testing w rflx)  Vitamin D deficiency -     VITAMIN D 25 Hydroxy (Vit-D Deficiency, Fractures); Future  Lipid screening -     Lipid panel; Future  Mild asthma without complication, unspecified whether persistent -     Albuterol Sulfate HFA; Inhale 2 puffs  into the lungs every 4 (four) hours as needed.  Dispense: 6.7 g; Refill: 0  Routine general medical examination at a health care facility  Normal physical exam findings, counseled patient on healthy amounts of exercise and good sleep hygiene. Handouts given on healthy eating and exercise. I also counseled patient on upcoming vaccinations that are recommended. Labs ordered for annual surveillance.   Return in about 6 months (around 08/31/2023) for HTN.     Karie Georges, MD

## 2023-02-28 NOTE — Patient Instructions (Addendum)
Flu, RSV and COVID booster up coming this fall.   Health Maintenance, Female Adopting a healthy lifestyle and getting preventive care are important in promoting health and wellness. Ask your health care provider about: The right schedule for you to have regular tests and exams. Things you can do on your own to prevent diseases and keep yourself healthy. What should I know about diet, weight, and exercise? Eat a healthy diet  Eat a diet that includes plenty of vegetables, fruits, low-fat dairy products, and lean protein. Do not eat a lot of foods that are high in solid fats, added sugars, or sodium. Maintain a healthy weight Body mass index (BMI) is used to identify weight problems. It estimates body fat based on height and weight. Your health care provider can help determine your BMI and help you achieve or maintain a healthy weight. Get regular exercise Get regular exercise. This is one of the most important things you can do for your health. Most adults should: Exercise for at least 150 minutes each week. The exercise should increase your heart rate and make you sweat (moderate-intensity exercise). Do strengthening exercises at least twice a week. This is in addition to the moderate-intensity exercise. Spend less time sitting. Even light physical activity can be beneficial. Watch cholesterol and blood lipids Have your blood tested for lipids and cholesterol at 61 years of age, then have this test every 5 years. Have your cholesterol levels checked more often if: Your lipid or cholesterol levels are high. You are older than 61 years of age. You are at high risk for heart disease. What should I know about cancer screening? Depending on your health history and family history, you may need to have cancer screening at various ages. This may include screening for: Breast cancer. Cervical cancer. Colorectal cancer. Skin cancer. Lung cancer. What should I know about heart disease, diabetes,  and high blood pressure? Blood pressure and heart disease High blood pressure causes heart disease and increases the risk of stroke. This is more likely to develop in people who have high blood pressure readings or are overweight. Have your blood pressure checked: Every 3-5 years if you are 23-67 years of age. Every year if you are 87 years old or older. Diabetes Have regular diabetes screenings. This checks your fasting blood sugar level. Have the screening done: Once every three years after age 68 if you are at a normal weight and have a low risk for diabetes. More often and at a younger age if you are overweight or have a high risk for diabetes. What should I know about preventing infection? Hepatitis B If you have a higher risk for hepatitis B, you should be screened for this virus. Talk with your health care provider to find out if you are at risk for hepatitis B infection. Hepatitis C Testing is recommended for: Everyone born from [birth year] through 1964. Anyone with known risk factors for hepatitis C. Sexually transmitted infections (STIs) Get screened for STIs, including gonorrhea and chlamydia, if: You are sexually active and are younger than 61 years of age. You are older than 61 years of age and your health care provider tells you that you are at risk for this type of infection. Your sexual activity has changed since you were last screened, and you are at increased risk for chlamydia or gonorrhea. Ask your health care provider if you are at risk. Ask your health care provider about whether you are at high risk for HIV. Your health  care provider may recommend a prescription medicine to help prevent HIV infection. If you choose to take medicine to prevent HIV, you should first get tested for HIV. You should then be tested every 3 months for as long as you are taking the medicine. Pregnancy If you are about to stop having your period (premenopausal) and you may become pregnant, seek  counseling before you get pregnant. Take 400 to 800 micrograms (mcg) of folic acid every day if you become pregnant. Ask for birth control (contraception) if you want to prevent pregnancy. Osteoporosis and menopause Osteoporosis is a disease in which the bones lose minerals and strength with aging. This can result in bone fractures. If you are 38 years old or older, or if you are at risk for osteoporosis and fractures, ask your health care provider if you should: Be screened for bone loss. Take a calcium or vitamin D supplement to lower your risk of fractures. Be given hormone replacement therapy (HRT) to treat symptoms of menopause. Follow these instructions at home: Alcohol use Do not drink alcohol if: Your health care provider tells you not to drink. You are pregnant, may be pregnant, or are planning to become pregnant. If you drink alcohol: Limit how much you have to: 0-1 drink a day. Know how much alcohol is in your drink. In the [place] one drink equals one 12 oz bottle of beer (355 mL), one 5 oz glass of wine (148 mL), or one 1 oz glass of hard liquor (44 mL). Lifestyle Do not use any products that contain nicotine or tobacco. These products include cigarettes, chewing tobacco, and vaping devices, such as e-cigarettes. If you need help quitting, ask your health care provider. Do not use street drugs. Do not share needles. Ask your health care provider for help if you need support or information about quitting drugs. General instructions Schedule regular health, dental, and eye exams. Stay current with your vaccines. Tell your health care provider if: You often feel depressed. You have ever been abused or do not feel safe at home. Summary Adopting a healthy lifestyle and getting preventive care are important in promoting health and wellness. Follow your health care provider's instructions about healthy diet, exercising, and getting tested or screened for diseases. Follow your  health care provider's instructions on monitoring your cholesterol and blood pressure. This information is not intended to replace advice given to you by your health care provider. Make sure you discuss any questions you have with your health care provider. Document Revised: 11/23/2020 Document Reviewed: 11/23/2020 Elsevier Patient Education  2024 [employer].

## 2023-03-01 ENCOUNTER — Encounter: Payer: Self-pay | Admitting: Family Medicine

## 2023-03-01 ENCOUNTER — Other Ambulatory Visit: Payer: Self-pay

## 2023-03-01 ENCOUNTER — Other Ambulatory Visit (HOSPITAL_COMMUNITY): Payer: Self-pay

## 2023-03-01 DIAGNOSIS — I10 Essential (primary) hypertension: Secondary | ICD-10-CM

## 2023-03-01 MED ORDER — LEVOTHYROXINE SODIUM 112 MCG PO TABS
112.0000 ug | ORAL_TABLET | Freq: Every day | ORAL | 1 refills | Status: DC
  Filled 2023-03-01: qty 90, 90d supply, fill #0
  Filled 2023-05-28: qty 90, 90d supply, fill #1

## 2023-03-01 NOTE — Telephone Encounter (Signed)
I left a message on my chart for her to stop her vitamin D supplements.

## 2023-03-01 NOTE — Telephone Encounter (Signed)
Noted  

## 2023-03-21 ENCOUNTER — Encounter: Payer: Self-pay | Admitting: Family Medicine

## 2023-03-22 ENCOUNTER — Other Ambulatory Visit (HOSPITAL_COMMUNITY): Payer: Self-pay

## 2023-04-24 ENCOUNTER — Other Ambulatory Visit (HOSPITAL_COMMUNITY): Payer: Self-pay

## 2023-04-24 ENCOUNTER — Encounter: Payer: Self-pay | Admitting: Family Medicine

## 2023-04-25 ENCOUNTER — Other Ambulatory Visit: Payer: Self-pay

## 2023-04-25 ENCOUNTER — Other Ambulatory Visit (HOSPITAL_COMMUNITY): Payer: Self-pay

## 2023-04-25 MED ORDER — TACROLIMUS 0.1 % EX OINT
1.0000 | TOPICAL_OINTMENT | Freq: Every day | CUTANEOUS | 3 refills | Status: AC
  Filled 2023-04-25: qty 60, 20d supply, fill #0
  Filled 2023-08-16: qty 60, 20d supply, fill #1
  Filled 2024-02-20: qty 60, 20d supply, fill #2

## 2023-05-03 ENCOUNTER — Other Ambulatory Visit (HOSPITAL_COMMUNITY): Payer: Self-pay

## 2023-05-11 ENCOUNTER — Other Ambulatory Visit: Payer: Self-pay

## 2023-05-28 ENCOUNTER — Other Ambulatory Visit: Payer: Self-pay | Admitting: Family Medicine

## 2023-05-28 DIAGNOSIS — J45909 Unspecified asthma, uncomplicated: Secondary | ICD-10-CM

## 2023-05-29 ENCOUNTER — Other Ambulatory Visit (HOSPITAL_COMMUNITY): Payer: Self-pay

## 2023-05-29 ENCOUNTER — Other Ambulatory Visit: Payer: Self-pay

## 2023-05-29 MED ORDER — ALBUTEROL SULFATE HFA 108 (90 BASE) MCG/ACT IN AERS
2.0000 | INHALATION_SPRAY | RESPIRATORY_TRACT | 1 refills | Status: DC | PRN
  Filled 2023-05-29: qty 6.7, 25d supply, fill #0
  Filled 2023-08-16: qty 6.7, 25d supply, fill #1

## 2023-08-16 ENCOUNTER — Other Ambulatory Visit (HOSPITAL_COMMUNITY): Payer: Self-pay

## 2023-08-18 ENCOUNTER — Other Ambulatory Visit (HOSPITAL_COMMUNITY): Payer: Self-pay

## 2023-09-01 ENCOUNTER — Ambulatory Visit: Payer: Commercial Managed Care - PPO | Admitting: Family Medicine

## 2023-09-01 ENCOUNTER — Encounter: Payer: Self-pay | Admitting: Family Medicine

## 2023-09-01 ENCOUNTER — Ambulatory Visit (INDEPENDENT_AMBULATORY_CARE_PROVIDER_SITE_OTHER): Payer: Commercial Managed Care - PPO

## 2023-09-01 ENCOUNTER — Other Ambulatory Visit (HOSPITAL_COMMUNITY): Payer: Self-pay

## 2023-09-01 VITALS — BP 116/80 | HR 70 | Temp 97.6°F | Ht 65.0 in | Wt 222.1 lb

## 2023-09-01 DIAGNOSIS — M2550 Pain in unspecified joint: Secondary | ICD-10-CM | POA: Diagnosis not present

## 2023-09-01 DIAGNOSIS — I10 Essential (primary) hypertension: Secondary | ICD-10-CM | POA: Diagnosis not present

## 2023-09-01 DIAGNOSIS — G8929 Other chronic pain: Secondary | ICD-10-CM

## 2023-09-01 DIAGNOSIS — M542 Cervicalgia: Secondary | ICD-10-CM

## 2023-09-01 DIAGNOSIS — M5030 Other cervical disc degeneration, unspecified cervical region: Secondary | ICD-10-CM

## 2023-09-01 DIAGNOSIS — E039 Hypothyroidism, unspecified: Secondary | ICD-10-CM | POA: Diagnosis not present

## 2023-09-01 DIAGNOSIS — M4802 Spinal stenosis, cervical region: Secondary | ICD-10-CM | POA: Diagnosis not present

## 2023-09-01 DIAGNOSIS — M47812 Spondylosis without myelopathy or radiculopathy, cervical region: Secondary | ICD-10-CM | POA: Diagnosis not present

## 2023-09-01 LAB — TSH: TSH: 1.15 u[IU]/mL (ref 0.35–5.50)

## 2023-09-01 MED ORDER — CYCLOBENZAPRINE HCL 10 MG PO TABS
10.0000 mg | ORAL_TABLET | Freq: Three times a day (TID) | ORAL | 2 refills | Status: DC | PRN
  Filled 2023-09-01: qty 90, 30d supply, fill #0
  Filled 2023-10-19: qty 90, 30d supply, fill #1
  Filled 2023-11-13: qty 90, 30d supply, fill #2

## 2023-09-01 MED ORDER — LISINOPRIL-HYDROCHLOROTHIAZIDE 20-25 MG PO TABS
1.0000 | ORAL_TABLET | Freq: Every day | ORAL | 3 refills | Status: DC
  Filled 2023-09-01 – 2023-11-13 (×3): qty 90, 90d supply, fill #0

## 2023-09-01 NOTE — Progress Notes (Signed)
Established Patient Office Visit  Subjective   Patient ID: Kaitlyn Kelly, female    DOB: 01-03-1961  Age: 63 y.o. MRN: [id number]  Chief Complaint  Patient presents with   Medical Management of Chronic Issues    Pt is here for 6 month follow up today.  HTN -- BP in office performed and is well controlled. She  reports no side effects to the medications, no chest pain, SOB, dizziness or headaches. She has a BP cuff at home and is checking BP regularly, reports they are in the normal range.   Hypothyroid-- we had increased her dose to 112 mcg from 100 mcg, she reports her symptoms have gotten some better, states she does get more fatigued and starts having more hair loss when her thyroid is off.   Chronic neck pain-- pt reports that she has had this for almost 2 years now, she reports that it never really not better, she did try a new pillow and has bene using 800 mg Ibuprofen at night only which sometimes helps, states she is also using a heating pad. States that it never really goes away, occasional flare up. She is also reporting more pain in her hands. The cyclobenzaprine helps a little sometimes, the robaxin she was given in the past did not help.     Current Outpatient Medications  Medication Instructions   albuterol (VENTOLIN HFA) 108 (90 Base) MCG/ACT inhaler 2 puffs, Inhalation, Every 4 hours PRN   aspirin 81 mg, Oral, 2 times daily   clobetasol cream (TEMOVATE) 0.05 % 1 Application, Topical, Daily PRN   cyclobenzaprine (FLEXERIL) 10 mg, Oral, 3 times daily PRN   docusate sodium (COLACE) 200 mg, Every morning   levothyroxine (SYNTHROID) 112 mcg, Oral, Daily before breakfast   lisinopril-hydrochlorothiazide (ZESTORETIC) 20-25 MG tablet 1 tablet, Oral, Daily   tacrolimus (PROTOPIC) 0.1 % ointment Apply to affected areas daily   tacrolimus (PROTOPIC) 0.1 % ointment Apply 1 Application topically to affected area daily.    Patient Active Problem List   Diagnosis Date Noted    Physical exam 02/25/2022   S/P TKR (total knee replacement), left 09/14/2021   Acquired hypothyroidism 06/23/2021   Arthralgia of multiple joints 06/23/2021   Gastroesophageal reflux disease 06/23/2021   Lumbar back sprain 06/23/2021   Menopause 06/23/2021   Osteoarthritis 06/23/2021   Acute medial meniscus tear of right knee    Vulvar irritation 03/04/2020   Lichen sclerosus 03/04/2020   Pain in both hands 01/17/2020   Vitamin D deficiency 05/20/2009   Allergic rhinitis 05/20/2009   ELEVATED BLOOD PRESSURE WITHOUT DIAGNOSIS OF HYPERTENSION 05/20/2009   HASHIMOTO'S THYROIDITIS 12/07/2007   Obesity 06/11/2007   Essential hypertension 06/11/2007   Asthma 06/11/2007   HEMATURIA, HX OF 06/11/2007      Review of Systems  All other systems reviewed and are negative.     Objective:     BP 116/80   Pulse 70   Temp 97.6 F (36.4 C) (Oral)   Ht 5\' 5"  (1.651 m)   Wt 222 lb 1.6 oz (100.7 kg)   SpO2 98%   BMI 36.96 kg/m    Physical Exam Vitals reviewed.  Constitutional:      Appearance: Normal appearance. She is well-groomed and normal weight.  Eyes:     Conjunctiva/sclera: Conjunctivae normal.  Neck:     Thyroid: No thyromegaly.  Cardiovascular:     Rate and Rhythm: Normal rate and regular rhythm.     Pulses: Normal pulses.  Heart sounds: S1 normal and S2 normal.  Pulmonary:     Effort: Pulmonary effort is normal.     Breath sounds: Normal breath sounds and air entry.  Abdominal:     General: Bowel sounds are normal.  Musculoskeletal:     Right lower leg: No edema.     Left lower leg: No edema.  Neurological:     Mental Status: She is alert and oriented to person, place, and time. Mental status is at baseline.     Gait: Gait is intact.  Psychiatric:        Mood and Affect: Mood and affect normal.        Speech: Speech normal.        Behavior: Behavior normal.        Judgment: Judgment normal.      No results found for any visits on  09/01/23.    The 10-year ASCVD risk score (Arnett DK, et al., 2019) is: 3.9%    Assessment & Plan:  Essential hypertension Assessment & Plan: Chronic, stable, continue medications listed below as prescribed.    Orders: -     Lisinopril-hydroCHLOROthiazide; Take 1 tablet by mouth daily.  Dispense: 90 tablet; Refill: 3  Acquired hypothyroidism Assessment & Plan: On 112 mcg, needs new TSH today, will adjust dose if needed.  Orders: -     TSH; Future  Chronic neck pain Will refill her muscle relaxer, will get X-rays of the cervical spine to begin work up.   -     DG Cervical Spine Complete; Future -     Cyclobenzaprine HCl; Take 1 tablet (10 mg total) by mouth 3 (three) times daily as needed for muscle spasms.  Dispense: 90 tablet; Refill: 2  Arthralgia of multiple joints Assessment & Plan: With chronic neck pain >1 year, will order autoimmune labs for work up (has autoimmune thyroiditis and psoriasis already) to rule out inflammatory arthritis.   Orders: -     ANA w/Reflex; Future -     Cyclic citrul peptide antibody, IgG; Future -     Rheumatoid factor; Future     Return in about 6 months (around 02/29/2024) for annual physical exam.    Karie Georges, MD

## 2023-09-01 NOTE — Assessment & Plan Note (Signed)
On 112 mcg, needs new TSH today, will adjust dose if needed.

## 2023-09-01 NOTE — Assessment & Plan Note (Signed)
With chronic neck pain >1 year, will order autoimmune labs for work up (has autoimmune thyroiditis and psoriasis already) to rule out inflammatory arthritis.

## 2023-09-01 NOTE — Assessment & Plan Note (Addendum)
Chronic, stable, continue medications listed below as prescribed.

## 2023-09-03 ENCOUNTER — Other Ambulatory Visit: Payer: Self-pay | Admitting: Family Medicine

## 2023-09-03 ENCOUNTER — Other Ambulatory Visit (HOSPITAL_COMMUNITY): Payer: Self-pay

## 2023-09-03 DIAGNOSIS — I10 Essential (primary) hypertension: Secondary | ICD-10-CM

## 2023-09-03 LAB — ANA W/REFLEX: Anti Nuclear Antibody (ANA): NEGATIVE

## 2023-09-03 LAB — RHEUMATOID FACTOR: Rheumatoid fact SerPl-aCnc: <10 [IU]/mL (ref <14)

## 2023-09-03 LAB — CYCLIC CITRUL PEPTIDE ANTIBODY, IGG: Cyclic Citrullin Peptide Ab: <16 U

## 2023-09-04 ENCOUNTER — Other Ambulatory Visit: Payer: Self-pay

## 2023-09-04 ENCOUNTER — Other Ambulatory Visit (HOSPITAL_COMMUNITY): Payer: Self-pay

## 2023-09-04 MED ORDER — LEVOTHYROXINE SODIUM 112 MCG PO TABS
112.0000 ug | ORAL_TABLET | Freq: Every day | ORAL | 1 refills | Status: DC
Start: 2023-09-04 — End: 2024-03-21
  Filled 2023-09-04: qty 90, 90d supply, fill #0
  Filled 2023-11-13 – 2023-11-15 (×2): qty 90, 90d supply, fill #1
  Filled 2023-11-15: qty 30, 30d supply, fill #1
  Filled 2023-11-15: qty 90, 90d supply, fill #1
  Filled 2023-12-03: qty 30, 30d supply, fill #1
  Filled 2024-01-16: qty 30, 30d supply, fill #2
  Filled 2024-02-20: qty 30, 30d supply, fill #3

## 2023-09-17 ENCOUNTER — Encounter: Payer: Self-pay | Admitting: Family Medicine

## 2023-11-13 ENCOUNTER — Other Ambulatory Visit: Payer: Self-pay | Admitting: Family Medicine

## 2023-11-13 ENCOUNTER — Other Ambulatory Visit: Payer: Self-pay

## 2023-11-13 ENCOUNTER — Other Ambulatory Visit (HOSPITAL_COMMUNITY): Payer: Self-pay

## 2023-11-13 DIAGNOSIS — J45909 Unspecified asthma, uncomplicated: Secondary | ICD-10-CM

## 2023-11-13 MED ORDER — ALBUTEROL SULFATE HFA 108 (90 BASE) MCG/ACT IN AERS
2.0000 | INHALATION_SPRAY | RESPIRATORY_TRACT | 1 refills | Status: DC | PRN
  Filled 2023-11-13: qty 6.7, 25d supply, fill #0

## 2023-11-15 ENCOUNTER — Other Ambulatory Visit: Payer: Self-pay

## 2023-11-15 NOTE — Progress Notes (Signed)
 Referring Physician:  Aida House, MD [address] [place],  [place] [address]  Primary Physician:  Aida House, MD  History of Present Illness: 11/17/2023 Kaitlyn Kelly is here today with a chief complaint of neck pain ongoing for a couple of years.  She states that she feels as though her range of motion has become more limited.  Her neck feels more stiff and as a result she has some trouble sleeping.  She feels as though she has a muscle cramp of pain in her neck present often.  She denies any radiation down her arms or any associated numbness and tingling.  She denies any changes to her gait.  She is attempted Flexeril , meloxicam .  A heating pad helps and she also takes ibuprofen .    Conservative measures:  Physical therapy: has not participated in PT  Multimodal medical therapy including regular antiinflammatories: Flexeril ,   Injections: no epidural steroid injections  Past Surgery: none  Kaitlyn Kelly has no symptoms of cervical myelopathy.  The symptoms are causing a significant impact on the patient's life.   Review of Systems:  A 10 point review of systems is negative, except for the pertinent positives and negatives detailed in the HPI.  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   Arthritis    Asthma    Hemorrhoids    History of kidney stones    Hypertension    Hypothyroidism    Medical history non-contributory    Pneumonia    PONV (postoperative nausea and vomiting)    Thyroid  disease     Past Surgical History: Past Surgical History:  Procedure Laterality Date   COLONOSCOPY     had a 2014, 2017   KNEE ARTHROSCOPY WITH MENISCAL REPAIR Right 03/30/2021   Procedure: right knee meniscal root tear repair; arthroscopy;  Surgeon: Jasmine Mesi, MD;  Location: [facility] OR;  Service: Orthopedics;  Laterality: Right;   TOTAL ABDOMINAL HYSTERECTOMY Left 08/30/2002    TAH/RSO/LO cystectomy for bleeding, bilateral cysts   TOTAL KNEE ARTHROPLASTY Left 09/14/2021   Procedure: LEFT TOTAL KNEE ARTHROPLASTY;  Surgeon: Jasmine Mesi, MD;  Location: [facility] OR;  Service: Orthopedics;  Laterality: Left;   TUBAL LIGATION     VULVA /PERINEUM BIOPSY  03/08/2020        Allergies: Allergies as of 11/17/2023 - Review Complete 11/17/2023  Allergen Reaction Noted   Shingrix [zoster vac recomb adjuvanted]  06/19/2020   Sulfonamide derivatives Rash     Medications: Outpatient Encounter Medications as of 11/17/2023  Medication Sig   albuterol  (VENTOLIN  HFA) 108 (90 Base) MCG/ACT inhaler Inhale 2 puffs into the lungs every 4 (four) hours as needed.   aspirin  81 MG chewable tablet Chew 1 tablet (81 mg total) by mouth 2 (two) times daily.   clobetasol  cream (TEMOVATE ) 0.05 % Apply 1 Application topically daily as needed (lichen sclerosus).   cyclobenzaprine  (FLEXERIL ) 10 MG tablet Take 1 tablet (10 mg total) by mouth 3 (three) times daily as needed for muscle spasms.   docusate sodium  (COLACE) 100 MG capsule Take 200 mg by mouth in the morning.   levothyroxine  (SYNTHROID ) 112 MCG tablet Take 1 tablet (112 mcg total) by mouth daily before breakfast.   lisinopril -hydrochlorothiazide  (ZESTORETIC ) 20-25 MG tablet Take 1 tablet by mouth daily.   tacrolimus  (PROTOPIC ) 0.1 % ointment Apply to affected areas daily   tacrolimus  (PROTOPIC ) 0.1 % ointment Apply 1  Application topically to affected area daily.   No facility-administered encounter medications on file as of 11/17/2023.    Social History: Social History   Tobacco Use   Smoking status: Former    Types: Cigarettes   Smokeless tobacco: Never  Vaping Use   Vaping status: Never Used  Substance Use Topics   Alcohol use: Yes    Alcohol/week: 1.0 - 2.0 standard drink of alcohol    Types: 1 - 2 Glasses of wine per week    Comment: socially   Drug use: No    Family Medical History: Family History  Problem Relation  Age of Onset   Heart disease Mother    Alcohol abuse Mother    Asthma Mother    Depression Mother    Drug abuse Mother    Early death Mother    Hypertension Mother    Thyroid  disease Sister    Diabetes Sister    Asthma Sister    Drug abuse Sister    Hypertension Sister    Psoriasis Sister    Interstitial cystitis Sister    Colon cancer Neg Hx    Esophageal cancer Neg Hx    Pancreatic cancer Neg Hx    Prostate cancer Neg Hx    Rectal cancer Neg Hx     Physical Examination: @VITALWITHPAIN @  General: Patient is well developed, well nourished, calm, collected, and in no apparent distress. Attention to examination is appropriate.  Psychiatric: Patient is non-anxious.  Head:  Pupils equal, round, and reactive to light.  ENT:  Oral mucosa appears well hydrated.  Neck:   Supple.  Some slight decreased range of motion in her cervical spine when looking side-to-side.  Respiratory: Patient is breathing without any difficulty.  Extremities: No edema.  Vascular: Palpable dorsal pedal pulses.  Skin:   On exposed skin, there are no abnormal skin lesions.  NEUROLOGICAL:     Awake, alert, oriented to person, place, and time.  Speech is clear and fluent. Fund of knowledge is appropriate.   Cranial Nerves: Pupils equal round and reactive to light.  Facial tone is symmetric.  Facial sensation is symmetric.  ROM of spine: Mild tenderness palpation of her cervical paraspinals.  Strength: Side Biceps Triceps Deltoid Interossei Grip Wrist Ext. Wrist Flex.  R 5 5 5 5 5 5 5   L 5 5 5 5 5 5 5     Reflexes are 2+ and symmetric at the biceps, triceps, brachioradialis.  Hoffman's is absent.   Bilateral upper and lower extremity sensation is intact to light touch.    Gait is normal.   No difficulty with tandem gait.   No evidence of dysmetria noted.  Medical Decision Making  Imaging: Cervical spine xrays (09/16/23):  FINDINGS: No fracture, dislocation or subluxation. No  spondylolisthesis. No osteolytic or osteoblastic changes. Prevertebral and cervical cranial soft tissues are unremarkable.   Degenerative disc disease noted with disc space narrowing and marginal osteophytes at C5-T1. Neural foraminal narrowing identified at each cervical level on the left most severely involving C3-4 and C6-7. Neural foramina on the right are relatively well-preserved.   IMPRESSION: Degenerative changes. No acute osseous abnormalities.  I have personally reviewed the images and agree with the above interpretation.  Assessment and Plan: Ms. Sahli is a pleasant 63 y.o. female is here today with a chief complaint of neck pain ongoing for a couple of years.  She states that she feels as though her range of motion has become more limited.  Her neck feels  more stiff and as a result she has some trouble sleeping.  She feels as though she has a muscle cramp of pain in her neck present often.  She denies any radiation down her arms or any associated numbness and tingling.  She denies any changes to her gait.  She is attempted Flexeril , meloxicam .  A heating pad helps and she also takes ibuprofen .  On examination, she has some slight decreased to range of motion.  She also has some mild cervical paraspinal tenderness.  On her x-rays completed 2 months ago she does have degenerative changes seen.  It was  a pleasure to see patient in clinic today.  Pain primarily feels musculoskeletal, degenerative in nature with little concern for cervical radiculopathy at this time.  Would like patient to be evaluated for dynamic listhesis and x-rays will be completed after clinic today.  In addition I do think she could benefit from continuing over-the-counter therapy as well as potentially trigger point injections or facet injections with our pain team.  A referral was sent for this.  Plan for physical therapy in the meantime and we will see back in approximately 8 weeks for any improvement in  symptoms.    Thank you for involving me in the care of this patient.   I spent a total of 45 minutes in both face-to-face and non-face-to-face activities for this visit on the date of this encounter including preparing to see the patient, obtaining and reviewing history, performing medical examination, counseling the patient, ordering additional tests, placing referrals, documenting clinical information, independently interpreting results and care coordination.  Ludwig Safer, PA-C Dept. of Neurosurgery

## 2023-11-17 ENCOUNTER — Ambulatory Visit: Admitting: Physician Assistant

## 2023-11-17 ENCOUNTER — Ambulatory Visit
Admission: RE | Admit: 2023-11-17 | Discharge: 2023-11-17 | Disposition: A | Discharge status: Home / Self Care | Attending: Physician Assistant | Admitting: Physician Assistant

## 2023-11-17 ENCOUNTER — Encounter: Payer: Self-pay | Admitting: Physician Assistant

## 2023-11-17 ENCOUNTER — Ambulatory Visit
Admission: RE | Admit: 2023-11-17 | Discharge: 2023-11-17 | Disposition: A | Discharge status: Home / Self Care | Source: Ambulatory Visit | Attending: Physician Assistant | Admitting: Physician Assistant

## 2023-11-17 VITALS — BP 134/88 | Ht 65.0 in | Wt 222.0 lb

## 2023-11-17 DIAGNOSIS — M47812 Spondylosis without myelopathy or radiculopathy, cervical region: Secondary | ICD-10-CM | POA: Diagnosis not present

## 2023-11-17 DIAGNOSIS — M542 Cervicalgia: Secondary | ICD-10-CM

## 2023-11-17 DIAGNOSIS — M5021 Other cervical disc displacement,  high cervical region: Secondary | ICD-10-CM | POA: Diagnosis not present

## 2023-11-17 DIAGNOSIS — G8929 Other chronic pain: Secondary | ICD-10-CM | POA: Diagnosis not present

## 2023-11-17 DIAGNOSIS — M4312 Spondylolisthesis, cervical region: Secondary | ICD-10-CM | POA: Diagnosis not present

## 2023-11-17 NOTE — Patient Instructions (Signed)
  [place]  [facility]  [address]  [address] [place], [place] [address]  [phone]

## 2023-11-21 ENCOUNTER — Other Ambulatory Visit (HOSPITAL_COMMUNITY): Payer: Self-pay

## 2023-11-27 DIAGNOSIS — Z1231 Encounter for screening mammogram for malignant neoplasm of breast: Secondary | ICD-10-CM | POA: Diagnosis not present

## 2023-11-27 LAB — HM MAMMOGRAPHY

## 2023-11-28 ENCOUNTER — Ambulatory Visit: Payer: Self-pay | Admitting: Family Medicine

## 2023-11-28 ENCOUNTER — Encounter: Payer: Self-pay | Admitting: Family Medicine

## 2023-12-07 ENCOUNTER — Other Ambulatory Visit: Payer: Self-pay

## 2023-12-13 ENCOUNTER — Encounter: Payer: Self-pay | Admitting: Student in an Organized Health Care Education/Training Program

## 2023-12-13 ENCOUNTER — Ambulatory Visit
Attending: Student in an Organized Health Care Education/Training Program | Admitting: Student in an Organized Health Care Education/Training Program

## 2023-12-13 VITALS — BP 134/93 | HR 89 | Temp 98.1°F | Resp 16 | Ht 66.0 in | Wt 220.0 lb

## 2023-12-13 DIAGNOSIS — M7918 Myalgia, other site: Secondary | ICD-10-CM | POA: Insufficient documentation

## 2023-12-13 DIAGNOSIS — M47812 Spondylosis without myelopathy or radiculopathy, cervical region: Secondary | ICD-10-CM | POA: Diagnosis not present

## 2023-12-13 NOTE — Progress Notes (Signed)
 PROVIDER NOTE: Interpretation of information contained herein should be left to medically-trained personnel. Specific patient instructions are provided elsewhere under "Patient Instructions" section of medical record. This document was created in part using AI and STT-dictation technology, any transcriptional errors that may result from this process are unintentional.  Patient: Kaitlyn Kelly  Service: E/M Encounter  Provider: Cephus Collin, MD  DOB: [birth year]  Delivery: Face-to-face  Specialty: Interventional Pain Management  MRN: [id number]  Setting: Ambulatory outpatient facility  Specialty designation: 09  Type: New Patient  Location: Outpatient office facility  PCP: Aida House, MD  DOS: 12/13/2023    Referring Prov.: Ludwig Safer, PA-C   Primary Reason(s) for Visit: Encounter for initial evaluation of one or more chronic problems (new to examiner) potentially causing chronic pain, and posing a threat to normal musculoskeletal function. (Level of risk: High) CC: Neck Pain (Base to mid)  HPI  Kaitlyn Kelly is a 63 y.o. year old, female patient, who comes for the first time to our practice referred by Ludwig Safer, PA-C for our initial evaluation of her chronic pain. She has HASHIMOTO'S THYROIDITIS; Vitamin D  deficiency; Obesity; Essential hypertension; Allergic rhinitis; Asthma; ELEVATED BLOOD PRESSURE WITHOUT DIAGNOSIS OF HYPERTENSION; HEMATURIA, HX OF; Pain in both hands; Vulvar irritation; Lichen sclerosus; Acute medial meniscus tear of right knee; Acquired hypothyroidism; Arthralgia of multiple joints; Gastroesophageal reflux disease; Lumbar back sprain; Menopause; Osteoarthritis; S/P TKR (total knee replacement), left; Physical exam; Cervical facet joint syndrome; and Cervical myofascial pain syndrome on their problem list. Today she comes in for evaluation of her Neck Pain (Base to mid)  Pain Assessment: Location:    (side, right worse) Radiating: denies Onset: More than a month  ago Duration: Chronic pain Quality: Aching, Grimacing, Sore ("cramp") Severity: 3 /10 (subjective, self-reported pain score)  Effect on ADL: driving, movement, sleeping Timing: Constant Modifying factors: Motrin , Flexerill, heating pad BP: (!) 134/93  HR: 89  Onset and Duration: Present longer than 3 months Cause of pain: Unknown Severity: No change since onset, NAS-11 at its worse: 10/10, NAS-11 at its best: 2/10, NAS-11 now: 2/10, and NAS-11 on the average: 4/10 Timing: Morning and Not influenced by the time of the day Aggravating Factors: Twisting Alleviating Factors: Hot packs and Medications Associated Problems: Fatigue Quality of Pain: Uncomfortable Previous Examinations or Tests: MRI scan and X-rays Previous Treatments: Physical Therapy and Stretching exercises  Discussed the use of AI scribe software for clinical note transcription with the patient, who gave verbal consent to proceed.  History of Present Illness   Kaitlyn Kelly is a 63 year old female with a history of arthritis and spinal degeneration who presents with neck pain. She was referred by neurosurgeons for further evaluation of her neck pain.  She experiences localized neck pain affecting the entire neck area without radiation to the shoulders, arms, or fingers. The pain has significantly impacted her sleep, preventing her from sleeping through the night for a long time.  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  She recalls a past experience with knee pain where cortisone injections were used, but eventually required knee replacement. She inquires about the frequency and duration of relief from potential neck injections, noting that she has not had this type of procedure before.       Meds   Current Outpatient Medications:    albuterol  (VENTOLIN  HFA)  108 (90 Base) MCG/ACT  inhaler, Inhale 2 puffs into the lungs every 4 (four) hours as needed., Disp: 6.7 g, Rfl: 1   aspirin  81 MG chewable tablet, Chew 1 tablet (81 mg total) by mouth 2 (two) times daily., Disp: 60 tablet, Rfl: 0   clobetasol  cream (TEMOVATE ) 0.05 %, Apply 1 Application topically daily as needed (lichen sclerosus)., Disp: 30 g, Rfl: 5   cyclobenzaprine  (FLEXERIL ) 10 MG tablet, Take 1 tablet (10 mg total) by mouth 3 (three) times daily as needed for muscle spasms., Disp: 90 tablet, Rfl: 2   docusate sodium  (COLACE) 100 MG capsule, Take 200 mg by mouth in the morning., Disp: , Rfl:    ibuprofen  (ADVIL ) 600 MG tablet, Take 600 mg by mouth every 6 (six) hours as needed (night for neck)., Disp: , Rfl:    levothyroxine  (SYNTHROID ) 112 MCG tablet, Take 1 tablet (112 mcg total) by mouth daily before breakfast., Disp: 90 tablet, Rfl: 1   lisinopril -hydrochlorothiazide  (ZESTORETIC ) 20-25 MG tablet, Take 1 tablet by mouth daily., Disp: 90 tablet, Rfl: 3   tacrolimus  (PROTOPIC ) 0.1 % ointment, Apply to affected areas daily, Disp: 60 g, Rfl: 2   tacrolimus  (PROTOPIC ) 0.1 % ointment, Apply 1 Application topically to affected area daily., Disp: 60 g, Rfl: 3  Imaging Review  DG Cervical Spine Complete  Narrative CLINICAL DATA:  Chronic neck pain.  EXAM: CERVICAL SPINE - COMPLETE 4+ VIEW  COMPARISON:  September 01, 2023  FINDINGS: There is no evidence of cervical spine fracture or prevertebral soft tissue swelling. Mild retrolisthesis C2 on C3. Mild anterolisthesis of C4 and C5. Minimal anterior spurring noted at C5, C6 and C7. Mild narrow intervertebral space at C6-7. Facet joint sclerosis noted throughout cervical spine. No other significant bone abnormalities are identified.  IMPRESSION: Degenerative joint changes of cervical spine.   Electronically Signed By: Anna Barnes M.D. On: 11/17/2023 15:46   Knee-L MR w/o contrast: Results for orders placed during the hospital encounter of 08/17/21  MR  Knee Left w/o contrast  Narrative CLINICAL DATA:  Chronic knee pain  EXAM: MRI OF THE LEFT KNEE WITHOUT CONTRAST  TECHNIQUE: Multiplanar, multisequence MR imaging of the knee was performed. No intravenous contrast was administered.  COMPARISON:  Radiographs dated August 16, 2021  FINDINGS: MENISCI  Medial: Degenerative changes and complex tear of the body/posterior horn of the medial meniscus with extrusion of the meniscus into the medial gutter.  Lateral: Intact.  LIGAMENTS  Cruciates: ACL and PCL are intact.  Collaterals: Medial collateral ligament is displaced medially due to extruded meniscal body. Lateral collateral ligament complex is intact.  CARTILAGE  Patellofemoral: Full-thickness cartilage loss with subchondral cystic changes. Moderate joint effusion and prominent synovial thickening. 1.3 cm intra-articular loose body about the posterior aspect of the posterior cruciate ligament.  Medial: Advanced articular cartilage thinning without subchondral cystic changes.  Lateral:  No chondral defect.  JOINT: Moderate joint effusion. Normal Hoffa's fat-pad. Mild plical thickening.  POPLITEAL FOSSA: Popliteus tendon is intact. No Baker's cyst.  EXTENSOR MECHANISM: Intact quadriceps tendon. Intact patellar tendon. Intact lateral patellar retinaculum. Intact medial patellar retinaculum. Intact MPFL.  BONES: No aggressive osseous lesion. No fracture or dislocation. Tricompartmental marginal osteophytes.  Other: No fluid collection or hematoma. Muscles are normal.  IMPRESSION: 1. Complex tear of the body/posterior horn of the medial meniscus with extrusion of the meniscus into the medial gutter.  2. Tricompartmental osteoarthritis prominent in the medial tibiofemoral and patellofemoral compartments with near complete loss of the articular cartilage,  moderate joint effusion and intra-articular loose bodies and synovitis.  3.  Cruciate and collateral  ligaments are intact.  4.  No evidence of acute fracture or dislocation.   Electronically Signed By: Imran  Ahmed D.O. On: 08/17/2021 11:45   Narrative CLINICAL DATA:  Insert is  EXAM: MRI OF THE RIGHT KNEE WITHOUT CONTRAST  TECHNIQUE: Multiplanar, multisequence MR imaging of the knee was performed. No intravenous contrast was administered.  COMPARISON:  Right knee radiograph 01/13/2021  FINDINGS: MENISCI  Medial: There is a large radial tear in the posterior horn of the medial meniscus approximately 1.1 cm from the meniscal root. There is degenerative tearing extending through the posterior horn-body junction into the meniscal body. There is 4 mm meniscal extrusion.  Lateral: Intact.  LIGAMENTS  Cruciates: ACL and PCL are intact.  Collaterals: Bowing of the MCL from extruded medial meniscus. The MCL is intact. Lateral collateral ligament complex is intact.  CARTILAGE  Patellofemoral: Mild-moderate chondrosis with partial thickness cartilage loss in the inferior patella.  Medial: Areas of high-grade partial thickness cartilage loss along the weight-bearing medial femoral condyle and medial tibial plateau.  Lateral:  Mild-moderate chondrosis.  JOINT: Small joint effusion.  POPLITEAL FOSSA: No Baker's cyst.  EXTENSOR MECHANISM: Intact quadriceps tendon. Intact patellar tendon. Intact lateral patellar retinaculum. Intact medial patellar retinaculum. Intact MPFL.  BONES: Bony edema in the medial tibial plateau related to overlying cartilage abnormality and meniscus tear. Tricompartment osteophyte formation.  Other: None  IMPRESSION: Large radial tear at the posterior horn of the medial meniscus, approximately 1.1 cm from the meniscal root with 4 mm extrusion and degenerative horizontal tearing extending through the posterior horn-body junction into the meniscal body.  Tricompartment osteoarthritis, worst in the medial compartment with areas of  high-grade partial thickness cartilage loss along the weight-bearing medial femoral condyle and tibial plateau.  Small joint effusion.   Electronically Signed By: Achilles Holes On: 02/07/2021 16:12    DG Knee 1-2 Views Left  Narrative CLINICAL DATA:  Evaluate for missing needle  EXAM: LEFT KNEE - 1-2 VIEW  COMPARISON:  08/16/2021  FINDINGS: Postop change from left total knee arthroplasty. No underlying periprosthetic fracture or dislocation. No retained metallic foreign body identified to explain the missing surgical needle.  IMPRESSION: No retained metallic foreign body identified to account for the missing surgical needle.  Results were called by telephone at the time of interpretation on 09/14/2021 at 1:22 pm to OR Room [phone], and staff member Kathaleen Pale verbally acknowledged these results.   Electronically Signed By: Kimberley Penman M.D. On: 09/14/2021 13:23  DG Knee Complete 4 Views Right  Narrative CLINICAL DATA:  Right knee pain, twisting injury  EXAM: RIGHT KNEE - COMPLETE 4+ VIEW  COMPARISON:  None.  FINDINGS: Normal alignment without acute osseous finding or fracture. No subluxation or dislocation. Preserved joint spaces. Very minor femoral and tibial plateau bony spurring.  IMPRESSION: No acute osseous finding.  Minimal degenerative changes.   Electronically Signed By: Melven Stable.  Shick M.D. On: 01/13/2021 07:48    Complexity Note: Imaging results reviewed.                         ROS  Cardiovascular: Daily Aspirin  intake and High blood pressure Pulmonary or Respiratory: Wheezing and difficulty taking a deep full breath (Asthma) Neurological: No reported neurological signs or symptoms such as seizures, abnormal skin sensations, urinary and/or fecal incontinence, being born with an abnormal open spine and/or a tethered spinal cord Psychological-Psychiatric:  No reported psychological or psychiatric signs or symptoms such as difficulty sleeping,  anxiety, depression, delusions or hallucinations (schizophrenial), mood swings (bipolar disorders) or suicidal ideations or attempts Gastrointestinal: No reported gastrointestinal signs or symptoms such as vomiting or evacuating blood, reflux, heartburn, alternating episodes of diarrhea and constipation, inflamed or scarred liver, or pancreas or irrregular and/or infrequent bowel movements Genitourinary: No reported renal or genitourinary signs or symptoms such as difficulty voiding or producing urine, peeing blood, non-functioning kidney, kidney stones, difficulty emptying the bladder, difficulty controlling the flow of urine, or chronic kidney disease Hematological: No reported hematological signs or symptoms such as prolonged bleeding, low or poor functioning platelets, bruising or bleeding easily, hereditary bleeding problems, low energy levels due to low hemoglobin or being anemic Endocrine: No reported endocrine signs or symptoms such as high or low blood sugar, rapid heart rate due to high thyroid  levels, obesity or weight gain due to slow thyroid  or thyroid  disease Rheumatologic: Joint aches and or swelling due to excess weight (Osteoarthritis) Musculoskeletal: Negative for myasthenia gravis, muscular dystrophy, multiple sclerosis or malignant hyperthermia Work History: Working full time  Allergies  Kaitlyn Kelly is allergic to shingrix [zoster vac recomb adjuvanted] and sulfonamide derivatives.  Laboratory Chemistry Profile   Renal Lab Results  Component Value Date   BUN 18 02/28/2023   CREATININE 1.03 02/28/2023   BCR NOT APPLICABLE 03/19/2021   GFR 59.05 (L) 02/28/2023   GFRAA 69 06/15/2020   GFRNONAA >60 09/15/2021   PROTEINUR NEGATIVE 09/10/2021     Electrolytes Lab Results  Component Value Date   NA 137 02/28/2023   K 3.6 02/28/2023   CL 101 02/28/2023   CALCIUM 9.7 02/28/2023     Hepatic Lab Results  Component Value Date   AST 19 02/28/2023   ALT 13 02/28/2023    ALBUMIN 4.5 02/28/2023   ALKPHOS 71 02/28/2023     ID Lab Results  Component Value Date   HIV NON-REACTIVE 02/28/2023   SARSCOV2NAA NEGATIVE 09/10/2021   STAPHAUREUS POSITIVE (A) 09/10/2021   MRSAPCR NEGATIVE 09/10/2021   PREGTESTUR NEGATIVE 03/31/2011     Bone Lab Results  Component Value Date   VD25OH 101.51 (HH) 02/28/2023     Endocrine Lab Results  Component Value Date   GLUCOSE 92 02/28/2023   GLUCOSEU NEGATIVE 09/10/2021   TSH 1.15 09/01/2023     Neuropathy Lab Results  Component Value Date   VITAMINB12 364 03/19/2021   FOLATE 11.6 03/19/2021   HIV NON-REACTIVE 02/28/2023     CNS No results found for: "COLORCSF", "APPEARCSF", "RBCCOUNTCSF", "WBCCSF", "POLYSCSF", "LYMPHSCSF", "EOSCSF", "PROTEINCSF", "GLUCCSF", "JCVIRUS", "CSFOLI", "IGGCSF", "LABACHR", "ACETBL"   Inflammation (CRP: Acute  ESR: Chronic) No results found for: "CRP", "ESRSEDRATE", "LATICACIDVEN"   Rheumatology Lab Results  Component Value Date   RF <10 09/01/2023   ANA Negative 09/01/2023     Coagulation Lab Results  Component Value Date   PLT 325.0 02/25/2022     Cardiovascular Lab Results  Component Value Date   HGB 12.9 02/25/2022   HCT 40.9 02/25/2022     Screening Lab Results  Component Value Date   SARSCOV2NAA NEGATIVE 09/10/2021   STAPHAUREUS POSITIVE (A) 09/10/2021   MRSAPCR NEGATIVE 09/10/2021   HIV NON-REACTIVE 02/28/2023   PREGTESTUR NEGATIVE 03/31/2011     Cancer No results found for: "CEA", "CA125", "LABCA2"   Allergens No results found for: "ALMOND", "APPLE", "ASPARAGUS", "AVOCADO", "BANANA", "BARLEY", "BASIL", "BAYLEAF", "GREENBEAN", "LIMABEAN", "WHITEBEAN", "BEEFIGE", "REDBEET", "BLUEBERRY", "BROCCOLI", "CABBAGE", "MELON", "CARROT", "CASEIN", "CASHEWNUT", "CAULIFLOWER", "CELERY"  Note: Lab results reviewed.  PFSH  Drug: Kaitlyn Kelly  reports no history of drug use. Alcohol:  reports current alcohol use of about 1.0 - 2.0 standard drink of alcohol per  week. Tobacco:  reports that she has quit smoking. Her smoking use included cigarettes. She has never used smokeless tobacco. Medical:  has a past medical history of Anal fissure, Arthritis, Asthma, Hemorrhoids, History of kidney stones, Hypertension, Hypothyroidism, Medical history non-contributory, Pneumonia, PONV (postoperative nausea and vomiting), and Thyroid  disease. Family: family history includes Alcohol abuse in her mother; Asthma in her mother and sister; Depression in her mother; Diabetes in her sister; Drug abuse in her mother and sister; Early death in her mother; Heart disease in her mother; Hypertension in her mother and sister; Interstitial cystitis in her sister; Psoriasis in her sister; Thyroid  disease in her sister.  Past Surgical History:  Procedure Laterality Date   COLONOSCOPY     had a 2014, 2017   KNEE ARTHROSCOPY WITH MENISCAL REPAIR Right 03/30/2021   Procedure: right knee meniscal root tear repair; arthroscopy;  Surgeon: Jasmine Mesi, MD;  Location: [facility] OR;  Service: Orthopedics;  Laterality: Right;   TOTAL ABDOMINAL HYSTERECTOMY Left 08/30/2002   TAH/RSO/LO cystectomy for bleeding, bilateral cysts   TOTAL KNEE ARTHROPLASTY Left 09/14/2021   Procedure: LEFT TOTAL KNEE ARTHROPLASTY;  Surgeon: Jasmine Mesi, MD;  Location: [facility] OR;  Service: Orthopedics;  Laterality: Left;   TUBAL LIGATION     VULVA /PERINEUM BIOPSY  03/08/2020       Active Ambulatory Problems    Diagnosis Date Noted   HASHIMOTO'S THYROIDITIS 12/07/2007   Vitamin D  deficiency 05/20/2009   Obesity 06/11/2007   Essential hypertension 06/11/2007   Allergic rhinitis 05/20/2009   Asthma 06/11/2007   ELEVATED BLOOD PRESSURE WITHOUT DIAGNOSIS OF HYPERTENSION 05/20/2009   HEMATURIA, HX OF 06/11/2007   Pain in both hands 01/17/2020   Vulvar irritation 03/04/2020   Lichen sclerosus 03/04/2020   Acute medial meniscus tear of right knee    Acquired hypothyroidism 06/23/2021   Arthralgia of  multiple joints 06/23/2021   Gastroesophageal reflux disease 06/23/2021   Lumbar back sprain 06/23/2021   Menopause 06/23/2021   Osteoarthritis 06/23/2021   S/P TKR (total knee replacement), left 09/14/2021   Physical exam 02/25/2022   Cervical facet joint syndrome 12/13/2023   Cervical myofascial pain syndrome 12/13/2023   Resolved Ambulatory Problems    Diagnosis Date Noted   Localized morphea 06/23/2021   Past Medical History:  Diagnosis Date   Anal fissure    Arthritis    Hemorrhoids    History of kidney stones    Hypertension    Hypothyroidism    Medical history non-contributory    Pneumonia    PONV (postoperative nausea and vomiting)    Thyroid  disease    Constitutional Exam  General appearance: Well nourished, well developed, and well hydrated. In no apparent acute distress Vitals:   12/13/23 1452  BP: (!) 134/93  Pulse: 89  Resp: 16  Temp: 98.1 F (36.7 C)  SpO2: 99%  Weight: 220 lb (99.8 kg)  Height: 5\' 6"  (1.676 m)   BMI Assessment: Estimated body mass index is 35.51 kg/m as calculated from the following:   Height as of this encounter: 5\' 6"  (1.676 m).   Weight as of this encounter: 220 lb (99.8 kg).  BMI interpretation table: BMI level Category Range association with higher incidence of chronic pain  <18 kg/m2 Underweight   18.5-24.9 kg/m2 Ideal body weight  25-29.9 kg/m2 Overweight Increased incidence by 20%  30-34.9 kg/m2 Obese (Class I) Increased incidence by 68%  35-39.9 kg/m2 Severe obesity (Class II) Increased incidence by 136%  >40 kg/m2 Extreme obesity (Class III) Increased incidence by 254%   Patient's current BMI Ideal Body weight  Body mass index is 35.51 kg/m. Ideal body weight: 59.3 kg (130 lb 11.7 oz) Adjusted ideal body weight: 75.5 kg (166 lb 7 oz)   BMI Readings from Last 4 Encounters:  12/13/23 35.51 kg/m  11/17/23 36.94 kg/m  09/01/23 36.96 kg/m  02/28/23 37.51 kg/m   Wt Readings from Last 4 Encounters:  12/13/23 220  lb (99.8 kg)  11/17/23 222 lb (100.7 kg)  09/01/23 222 lb 1.6 oz (100.7 kg)  02/28/23 225 lb 6.4 oz (102.2 kg)    Psych/Mental status: Alert, oriented x 3 (person, place, & time)       Eyes: PERLA Respiratory: No evidence of acute respiratory distress  Cervical Spine Area Exam  Skin & Axial Inspection: No masses, redness, edema, swelling, or associated skin lesions Alignment: Symmetrical Functional ROM: Pain restricted ROM      Stability: No instability detected Muscle Tone/Strength: Functionally intact. No obvious neuro-muscular anomalies detected. Sensory (Neurological): Musculoskeletal pain pattern Palpation: No palpable anomalies              5 out of 5 strength bilateral upper extremity: Shoulder abduction, elbow flexion, elbow extension, thumb extension.   Assessment  Primary Diagnosis & Pertinent Problem List: The primary encounter diagnosis was Cervical facet joint syndrome. A diagnosis of Cervical myofascial pain syndrome was also pertinent to this visit.  Visit Diagnosis (New problems to examiner): 1. Cervical facet joint syndrome   2. Cervical myofascial pain syndrome    Plan of Care (Initial workup plan)  Assessment and Plan    Localized neck pain due to cervical spine arthritis   Chronic neck pain is attributed to cervical spine arthritis and degeneration, confirmed by imaging. Pain is confined to the neck without radiation to shoulders, arms, or fingers. Initial treatment involves addressing potential muscle dysfunction with trigger point injections, preferred for her superficial nature and lower invasiveness. Relief from injections can vary, with onset in 1-3 days and duration ranging from weeks to months. Injections do not involve steroids, allowing for more frequent administration if needed. Schedule trigger point injections for January 04, 2024. Consider facet joint nerve blocks if trigger point injections are ineffective.        Procedure Orders         TRIGGER  POINT INJECTION      Provider-requested follow-up: Return in about 22 days (around 01/04/2024) for Cervical TPI.  Future Appointments  Date Time Provider Department Center  01/05/2024  9:00 AM Ludwig Safer, PA-C [facility] None  01/05/2024  1:00 PM Irene Mannheim D, PT [facility] None  01/11/2024  8:00 AM Cephus Collin, MD [facility] None  03/01/2024  8:00 AM Aida House, MD [facility]   I discussed the assessment and treatment plan with the patient. The patient was provided an opportunity to ask questions and all were answered. The patient agreed with the plan and demonstrated an understanding of the instructions.  Patient advised to call back or seek an in-person evaluation if the symptoms or condition worsens.  Duration of encounter: .  Total time on encounter, as per AMA guidelines included both the face-to-face and non-face-to-face time personally spent by the physician and/or other qualified health care professional(s) on the day of the encounter (includes time in  activities that require the physician or other qualified health care professional and does not include time in activities normally performed by clinical staff). Physician's time may include the following activities when performed: Preparing to see the patient (e.g., pre-charting review of records, searching for previously ordered imaging, lab work, and nerve conduction tests) Review of prior analgesic pharmacotherapies. Reviewing PMP Interpreting ordered tests (e.g., lab work, imaging, nerve conduction tests) Performing post-procedure evaluations, including interpretation of diagnostic procedures Obtaining and/or reviewing separately obtained history Performing a medically appropriate examination and/or evaluation Counseling and educating the patient/family/caregiver Ordering medications, tests, or procedures Referring and communicating with other health care professionals (when not separately reported) Documenting  clinical information in the electronic or other health record Independently interpreting results (not separately reported) and communicating results to the patient/ family/caregiver Care coordination (not separately reported)  Note by: Cephus Collin, MD (TTS and AI technology used. I apologize for any typographical errors that were not detected and corrected.) Date: 12/13/2023; Time: 3:33 PM

## 2023-12-13 NOTE — Progress Notes (Signed)
 Safety precautions to be maintained throughout the outpatient stay will include: orient to surroundings, keep bed in low position, maintain call bell within reach at all times, provide assistance with transfer out of bed and ambulation.

## 2024-01-05 ENCOUNTER — Ambulatory Visit (HOSPITAL_COMMUNITY): Attending: Physician Assistant

## 2024-01-05 ENCOUNTER — Ambulatory Visit: Admitting: Physician Assistant

## 2024-01-05 ENCOUNTER — Other Ambulatory Visit: Payer: Self-pay

## 2024-01-05 ENCOUNTER — Encounter (HOSPITAL_COMMUNITY): Payer: Self-pay

## 2024-01-05 DIAGNOSIS — M542 Cervicalgia: Secondary | ICD-10-CM | POA: Insufficient documentation

## 2024-01-05 DIAGNOSIS — R29898 Other symptoms and signs involving the musculoskeletal system: Secondary | ICD-10-CM | POA: Insufficient documentation

## 2024-01-05 NOTE — Patient Instructions (Signed)

## 2024-01-05 NOTE — Therapy (Signed)
 OUTPATIENT PHYSICAL THERAPY CERVICAL EVALUATION   Patient Name: Kaitlyn Kelly MRN: [id number] DOB:July 31, 1960, 63 y.o., female Today's Date: 01/05/2024  END OF SESSION:  PT End of Session - 01/05/24 1301     Visit Number 1    Number of Visits 13    Date for PT Re-Evaluation 02/16/24    Authorization Type [employer]    Authorization Time Period no auth    Progress Note Due on Visit 10    PT Start Time 1302    PT Stop Time 1345    PT Time Calculation (min) 43 min    Activity Tolerance Patient tolerated treatment well    Behavior During Therapy WFL for tasks assessed/performed          Past Medical History:  Diagnosis Date   Anal fissure    related to Lichens Sclerosis   Arthritis    Asthma    Hemorrhoids    History of kidney stones    Hypertension    Hypothyroidism    Medical history non-contributory    Pneumonia    PONV (postoperative nausea and vomiting)    Thyroid  disease    Past Surgical History:  Procedure Laterality Date   COLONOSCOPY     had a 2014, 2017   KNEE ARTHROSCOPY WITH MENISCAL REPAIR Right 03/30/2021   Procedure: right knee meniscal root tear repair; arthroscopy;  Surgeon: Jasmine Mesi, MD;  Location: [facility] OR;  Service: Orthopedics;  Laterality: Right;   TOTAL ABDOMINAL HYSTERECTOMY Left 08/30/2002   TAH/RSO/LO cystectomy for bleeding, bilateral cysts   TOTAL KNEE ARTHROPLASTY Left 09/14/2021   Procedure: LEFT TOTAL KNEE ARTHROPLASTY;  Surgeon: Jasmine Mesi, MD;  Location: [facility] OR;  Service: Orthopedics;  Laterality: Left;   TUBAL LIGATION     VULVA /PERINEUM BIOPSY  03/08/2020       Patient Active Problem List   Diagnosis Date Noted   Cervical facet joint syndrome 12/13/2023   Cervical myofascial pain syndrome 12/13/2023   Physical exam 02/25/2022   S/P TKR (total knee replacement), left 09/14/2021   Acquired hypothyroidism 06/23/2021   Arthralgia of multiple joints 06/23/2021   Gastroesophageal reflux disease 06/23/2021    Lumbar back sprain 06/23/2021   Menopause 06/23/2021   Osteoarthritis 06/23/2021   Acute medial meniscus tear of right knee    Vulvar irritation 03/04/2020   Lichen sclerosus 03/04/2020   Pain in both hands 01/17/2020   Vitamin D  deficiency 05/20/2009   Allergic rhinitis 05/20/2009   ELEVATED BLOOD PRESSURE WITHOUT DIAGNOSIS OF HYPERTENSION 05/20/2009   HASHIMOTO'S THYROIDITIS 12/07/2007   Obesity 06/11/2007   Essential hypertension 06/11/2007   Asthma 06/11/2007   HEMATURIA, HX OF 06/11/2007    PCP: Aida House, MD  REFERRING PROVIDER: Ludwig Safer, PA-C  REFERRING DIAG: M54.2 (ICD-10-CM) - Neck pain   THERAPY DIAG:  Neck pain  Decreased range of motion of neck  Rationale for Evaluation and Treatment: Rehabilitation  ONSET DATE: ~ a couple of years  SUBJECTIVE:  SUBJECTIVE STATEMENT: Neck pain that has been going on for a couple of years. It been getting worse. Pt reporting that the pain is waking her up at night. Pt has tried postural pillow to assist. She reports knot feelings. Pt reports twisting in certain directions in painful. Has adjusted her laptop at work to ease strain. Uses heat. 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  Hand dominance: Right  PERTINENT HISTORY:    PAIN:  Are you having pain? Yes: NPRS scale: 1/10; Worst 7-8/10 Pain location: Neck, bilateral upper trapezius, lateral aspect of necks. Pt pointing to suboccipital, upper trapezius  Pain description: constant, achy, occasionally sharp.  Aggravating factors: Sleeping  Relieving factors: heat  PRECAUTIONS: None  RED FLAGS: Cervical red flags: Dysphagia No, Dysmetria No, Diplopia No, Nystagmus No, and Nausea No     WEIGHT BEARING RESTRICTIONS: No  FALLS:   Has patient fallen in last 6 months? No   PATIENT GOALS: like to be out of pain    OBJECTIVE:  Note: Objective measures were completed at Evaluation unless otherwise noted.  DIAGNOSTIC FINDINGS:  CCLINICAL DATA:  Chronic neck pain.   EXAM: CERVICAL SPINE - COMPLETE 4+ VIEW   COMPARISON:  September 01, 2023   FINDINGS: There is no evidence of cervical spine fracture or prevertebral soft tissue swelling. Mild retrolisthesis C2 on C3. Mild anterolisthesis of C4 and C5. Minimal anterior spurring noted at C5, C6 and C7. Mild narrow intervertebral space at C6-7. Facet joint sclerosis noted throughout cervical spine. No other significant bone abnormalities are identified.   IMPRESSION: Degenerative joint changes of cervical spine.  PATIENT SURVEYS:  NDI: 9/50 = 18%  COGNITION: Overall cognitive status: Within functional limits for tasks assessed  SENSATION: WFL  POSTURE: rounded shoulders, forward head, and increased thoracic kyphosis  PALPATION: Tender to palpate along bilateral UT with multiple muscle fasciculations/trigger points.  Hypomobility segmentally at  C2-C5   CERVICAL ROM:   Active ROM A/PROM (deg) eval  Flexion 31  Extension 31  Right lateral flexion 8  Left lateral flexion 12  Right rotation 31  Left rotation 31   (Blank rows = not tested)  UPPER EXTREMITY ROM:  Active ROM Right eval Left eval  Shoulder flexion 140 140  Shoulder extension    Shoulder abduction 130 130  Shoulder adduction    Shoulder extension    Shoulder internal rotation    Shoulder external rotation    Elbow flexion    Elbow extension    Wrist flexion    Wrist extension    Wrist ulnar deviation    Wrist radial deviation    Wrist pronation    Wrist supination     (Blank rows = not tested)  UPPER EXTREMITY MMT:  MMT Right eval Left eval  Shoulder flexion 4- 4-  Shoulder extension    Shoulder abduction 4- 4+  Shoulder adduction    Shoulder extension     Shoulder internal rotation    Shoulder external rotation 4- 4-  Middle trapezius    Lower trapezius    Elbow flexion    Elbow extension    Wrist flexion    Wrist extension    Wrist ulnar deviation    Wrist radial deviation    Wrist pronation    Wrist supination    Grip strength     (Blank rows = not tested)  CERVICAL SPECIAL TESTS:  Neck flexor muscle endurance test: Positive, Spurling's test: Positive, and Distraction test: Negative  TREATMENT DATE:   01/05/2024 PT Evaluation and HEP                                                                                                                                 PATIENT EDUCATION:  Education details: PT Evaluation, findings, prognosis, frequency, attendance policy, and HEP. Person educated: Patient Education method: Medical illustrator Education comprehension: verbalized understanding  HOME EXERCISE PROGRAM: Access Code: [email or web address] URL: [email or web address] Date: 01/05/2024 Prepared by: Irene Mannheim  Exercises - Seated Assisted Cervical Rotation with Towel  - 1 x daily - 7 x weekly - 3 sets - 12-15 reps - Cervical Extension AROM with Strap  - 1 x daily - 7 x weekly - 3 sets - 12-15 reps - Seated Upper Trapezius Stretch  - 1 x daily - 7 x weekly - 3 sets - 2-3 reps - 30 hold - Seated Scapular Retraction  - 1 x daily - 7 x weekly - 3 sets - 10 reps  ASSESSMENT:  CLINICAL IMPRESSION: Patient is a 62y.o. female who was seen today for physical therapy evaluation and treatment for M54.2 (ICD-10-CM) - Neck pain.  Patient with chronic neck pain over the past few years.  Observation demonstrating significant upper body crush syndrome with rounded shoulders, forward neck increased thoracic kyphosis.  Pain primarily located along bilateral proximal upper trapezius and lateral aspect of cervical spine musculature.  Patient tender to palpate in bilateral upper traps.  Patient with significant range of motion  limitations, reduced upper body muscle strength and postural musculature, and constant pain which is limiting patient's functional ADLs involving neck mobility for driving and various job functions. Pt will benefit from skilled Physical Therapy services to address deficits/limitations in order to improve functional and QOL.    OBJECTIVE IMPAIRMENTS: decreased ROM, decreased strength, hypomobility, postural dysfunction, and pain.   ACTIVITY LIMITATIONS: reach over head and driving, reaching  PARTICIPATION LIMITATIONS: cleaning, community activity, occupation, and yard work  PERSONAL FACTORS: Age are also affecting patient's functional outcome.   REHAB POTENTIAL: Excellent  CLINICAL DECISION MAKING: Stable/uncomplicated  EVALUATION COMPLEXITY: Low   GOALS: Goals reviewed with patient? No  SHORT TERM GOALS: Target date: 01/26/24  Pt will be independent with HEP in order to demonstrate participation in Physical Therapy POC.  Baseline: Goal status: INITIAL  2.  Pt will report 3/10 pain scale during UB ADLs in order to reduce pain and to optimize function.  Baseline:  Goal status: INITIAL  LONG TERM GOALS: Target date: 02/16/24  Pt will improve Deep Neck Flexor Endurance test by 10 seconds in order to demonstrate improved functional strength to return to desired activities.  Baseline: see objective.  Goal status: INITIAL  2.  Pt will improve Cervical ROM by at least 10 degrees in order to improve functional mobility during ADLs.  Baseline: see objective.  Goal status: INITIAL  3.  Pt will improve NDI score by 5 points in order to demonstrate  improved pain with functional goals and outcomes. Baseline: see objective.  Goal status: INITIAL  4.  Pt will report 0/10 pain scale during UB ADLs in order to reduce pain and to optimize function lasting greater than > 30 minutes.  Baseline: see objective.  Goal status: INITIAL  5.  Pt will improve upper body MMT by at least one half  muscle grade to improve postural endurance and tolerance during ADLs. Baseline: see objective.  Goal status: INITIAL   PLAN:  PT FREQUENCY: 1-2x/week  PT DURATION: 6 weeks  PLANNED INTERVENTIONS: 97164- PT Re-evaluation, 97110-Therapeutic exercises, 97530- Therapeutic activity, [id number]- Neuromuscular re-education, 97535- Self Care, 16109- Manual therapy, [id number]- Electrical stimulation (unattended), [id number]- Electrical stimulation (manual), and 20560 (1-2 muscles), 20561 (3+ muscles)- Dry Needling  PLAN FOR NEXT SESSION: Discussed dry needling with patient, soft tissue mobility upper traps and suboccipitals.  Postural strengthening.  Cervical range of motion.  Gatha Kaska PT, DPT [facility] [phone] office   Gatha Kaska, PT 01/05/2024, 1:54 PM

## 2024-01-11 ENCOUNTER — Encounter: Payer: Self-pay | Admitting: Student in an Organized Health Care Education/Training Program

## 2024-01-11 ENCOUNTER — Ambulatory Visit
Attending: Student in an Organized Health Care Education/Training Program | Admitting: Student in an Organized Health Care Education/Training Program

## 2024-01-11 VITALS — BP 119/88 | HR 89 | Temp 98.1°F | Resp 16 | Ht 63.0 in | Wt 190.0 lb

## 2024-01-11 DIAGNOSIS — M7918 Myalgia, other site: Secondary | ICD-10-CM | POA: Diagnosis not present

## 2024-01-11 MED ORDER — ROPIVACAINE HCL 2 MG/ML IJ SOLN
9.0000 mL | Freq: Once | INTRAMUSCULAR | Status: AC
  Administered 2024-01-11: 20 mL via PERINEURAL

## 2024-01-11 MED ORDER — ROPIVACAINE HCL 2 MG/ML IJ SOLN
INTRAMUSCULAR | Status: AC
  Filled 2024-01-11: qty 20

## 2024-01-11 NOTE — Progress Notes (Signed)
 PROVIDER NOTE: Interpretation of information contained herein should be left to medically-trained personnel. Specific patient instructions are provided elsewhere under Patient Instructions section of medical record. This document was created in part using STT-dictation technology, any transcriptional errors that may result from this process are unintentional.  Patient: Kaitlyn Kelly Type: Established DOB: July 19, 1960 MRN: [id number] PCP: Ozell Heron HERO, MD  Service: Procedure DOS: 01/11/2024 Setting: Ambulatory Location: Ambulatory outpatient facility Delivery: Face-to-face Provider: Wallie Sherry, MD Specialty: Interventional Pain Management Specialty designation: 09 Location: Outpatient facility Ref. Prov.: Ozell Heron HERO, MD       Interventional Therapy   Type:  Cervical Trigger Point Injection (Myoneural Block) (1-2 muscle groups)  #1 (w/o steroids)  CPT: 20552 Laterality: Bilateral (-50)   Imaging: N/A. Landmark-guided,            DOS: 01/11/2024  Performed by: Wallie Sherry, MD  Medical Necessity (reasoning)  Purpose: Diagnostic/Therapeutic Rationale (medical necessity): procedure needed and proper for the diagnosis and/or treatment of Kaitlyn Kelly's medical symptoms and needs.  1. Cervical myofascial pain syndrome    NAS-11 Pain score:   Pre-procedure: 6 /10   Post-procedure: 6 /10       Cervical and Trapezius TPI  Approach: Percutaneous  Type of procedure: Myoneural injection   Position  Prep  Materials  Position: Sitting. Patient assisted into a comfortable position. Pressure points checked.  Prep solution: ChloraPrep (2% chlorhexidine  gluconate and 70% isopropyl alcohol) The target area was identified and the area prepped in the usual manner.  Prep Area: cervical region Materials:   Tray: Block Needle(s):  Type: Regular  Gauge (G): 25  Length: 1.5-in  Qty: 1  H&P (Pre-op Assessment):  Kaitlyn Kelly is a 63 y.o. (year old), female patient, seen  today for interventional treatment. She  has a past surgical history that includes Colonoscopy; Total abdominal hysterectomy (Left, 08/30/2002); Vulva / perineum biopsy (03/08/2020); Tubal ligation; Knee arthroscopy with meniscal repair (Right, 03/30/2021); and Total knee arthroplasty (Left, 09/14/2021). Kaitlyn Kelly has a current medication list which includes the following prescription(s): albuterol , aspirin , clobetasol  cream, cyclobenzaprine , docusate sodium , ibuprofen , levothyroxine , lisinopril -hydrochlorothiazide , tacrolimus , and tacrolimus , and the following Facility-Administered Medications: ropivacaine (pf) 2 mg/ml (0.2%). Her primarily concern today is the Neck Pain  Initial Vital Signs:  Pulse/HCG Rate: 93  Temp: 97.9 F (36.6 C) Resp: 16 BP: 133/76 SpO2: 99 %  BMI: Estimated body mass index is 33.66 kg/m as calculated from the following:   Height as of this encounter: 5' 3 (1.6 m).   Weight as of this encounter: 190 lb (86.2 kg).  Risk Assessment: Allergies: Reviewed. She is allergic to shingrix [zoster vac recomb adjuvanted] and sulfonamide derivatives.  Allergy Precautions: None required Coagulopathies: Reviewed. None identified.  Blood-thinner therapy: None at this time Active Infection(s): Reviewed. None identified. Kaitlyn Kelly is afebrile  Site Confirmation: Kaitlyn Kelly was asked to confirm the procedure and laterality before marking the site Procedure checklist: Completed Consent: Before the procedure and under the influence of no sedative(s), amnesic(s), or anxiolytics, the patient was informed of the treatment options, risks and possible complications. To fulfill our ethical and legal obligations, as recommended by the American Medical Association's Code of Ethics, I have informed the patient of my clinical impression; the nature and purpose of the treatment or procedure; the risks, benefits, and possible complications of the intervention; the alternatives, including doing  nothing; the risk(s) and benefit(s) of the alternative treatment(s) or procedure(s); and the risk(s) and benefit(s) of doing nothing. The patient was provided  information about the general risks and possible complications associated with the procedure. These may include, but are not limited to: failure to achieve desired goals, infection, bleeding, organ or nerve damage, allergic reactions, paralysis, and death. In addition, the patient was informed of those risks and complications associated to the procedure, such as failure to decrease pain; infection; bleeding; organ or nerve damage with subsequent damage to sensory, motor, and/or autonomic systems, resulting in permanent pain, numbness, and/or weakness of one or several areas of the body; allergic reactions; (i.e.: anaphylactic reaction); and/or death. Furthermore, the patient was informed of those risks and complications associated with the medications. These include, but are not limited to: allergic reactions (i.e.: anaphylactic or anaphylactoid reaction(s)); adrenal axis suppression; blood sugar elevation that in diabetics may result in ketoacidosis or comma; water  retention that in patients with history of congestive heart failure may result in shortness of breath, pulmonary edema, and decompensation with resultant heart failure; weight gain; swelling or edema; medication-induced neural toxicity; particulate matter embolism and blood vessel occlusion with resultant organ, and/or nervous system infarction; and/or aseptic necrosis of one or more joints. Finally, the patient was informed that Medicine is not an exact science; therefore, there is also the possibility of unforeseen or unpredictable risks and/or possible complications that may result in a catastrophic outcome. The patient indicated having understood very clearly. We have given the patient no guarantees and we have made no promises. Enough time was given to the patient to ask questions, all of  which were answered to the patient's satisfaction. Kaitlyn Kelly has indicated that she wanted to continue with the procedure. Attestation: I, the ordering provider, attest that I have discussed with the patient the benefits, risks, side-effects, alternatives, likelihood of achieving goals, and potential problems during recovery for the procedure that I have provided informed consent. Date  Time: 01/11/2024  7:53 AM   Pre-Procedure Preparation:  Monitoring: As per clinic protocol. Respiration, ETCO2, SpO2, BP, heart rate and rhythm monitor placed and checked for adequate function Safety Precautions: Patient was assessed for positional comfort and pressure points before starting the procedure. Time-out: I initiated and conducted the Time-out before starting the procedure, as per protocol. The patient was asked to participate by confirming the accuracy of the Time Out information. Verification of the correct person, site, and procedure were performed and confirmed by me, the nursing staff, and the patient. Time-out conducted as per Joint Commission's Universal Protocol (UP.01.01.01). Time: 0826 Start Time: 0828 hrs.   Narrative                Start Time: 0828 hrs.  Standard Safety Precautions: Protocol guidelines were followed. Aspiration was conducted prior to injection. At no point did I inject any substances, as a needle was being advanced. No attempts were made at seeking a paresthesia. Safe injection practices and needle disposal techniques used. Medications properly checked for expiration dates. SDV (single dose vial) medications used.  Local Anesthesia: Skin & deeper tissues infiltrated with local anesthetic. Appropriate amount of time allowed for local anesthetics to take effect.   Technical description:  The target area was identified and the area prepped in the usual manner. The procedure needles were then advanced to the target area. Proper needle placement secured. Negative aspiration  confirmed. Solution injected in intermittent fashion, asking for systemic symptoms every 0.5cc of injectate. The needles were then removed and the area cleansed, making sure to leave some of the prepping solution back to take advantage of its long term bactericidal  properties.  Approximately 9 trigger points injected with 0.5 to 1 cc of 0.2% ropivacaine with needling performed  Vitals:   01/11/24 0814 01/11/24 0816  BP: 133/76 119/88  Pulse: 93 89  Resp: 16 16  Temp: 97.9 F (36.6 C) 98.1 F (36.7 C)  SpO2: 99% 99%  Weight: 190 lb (86.2 kg)   Height: 5' 3 (1.6 m)      End Time: 0832 hrs.    Post-operative Assessment:  Post-procedure Vital Signs:  Pulse/HCG Rate: 89  Temp: 98.1 F (36.7 C) Resp: 16 BP: 119/88 SpO2: 99 %  EBL: None  Complications: No immediate post-treatment complications observed by team, or reported by patient.  Note: The patient tolerated the entire procedure well. A repeat set of vitals were taken after the procedure and the patient was kept under observation following institutional policy, for this type of procedure. Post-procedural neurological assessment was performed, showing return to baseline, prior to discharge. The patient was provided with post-procedure discharge instructions, including a section on how to identify potential problems. Should any problems arise concerning this procedure, the patient was given instructions to immediately contact us , at any time, without hesitation. In any case, we plan to contact the patient by telephone for a follow-up status report regarding this interventional procedure.  Comments:  No additional relevant information.   Plan of Care (POC)  Orders:  No orders of the defined types were placed in this encounter.   Medications ordered for procedure: Meds ordered this encounter  Medications   ropivacaine (PF) 2 mg/mL (0.2%) (NAROPIN) injection 9 mL   Medications administered: Kamani G. Luevanos had no  medications administered during this visit.  See the medical record for exact dosing, route, and time of administration.    Follow-up plan:   Return in about 3 weeks (around 02/01/2024) for virtual PPE.     Recent Visits Date Type Provider Dept  12/13/23 Office Visit Marcelino Nurse, MD [facility]  Showing recent visits within past 90 days and meeting all other requirements Today's Visits Date Type Provider Dept  01/11/24 Procedure visit Marcelino Nurse, MD [facility]  Showing today's visits and meeting all other requirements Future Appointments Date Type Provider Dept  02/01/24 Appointment Marcelino Nurse, MD [facility]  Showing future appointments within next 90 days and meeting all other requirements   Disposition: Discharge home  Discharge (Date  Time): 01/11/2024; 0837 hrs.   Primary Care Physician: Ozell Heron HERO, MD Location: [facility] Note by: Nurse Marcelino, MD (TTS technology used. I apologize for any typographical errors that were not detected and corrected.) Date: 01/11/2024; Time: 8:57 AM  Disclaimer:  Medicine is not an Visual merchandiser. The only guarantee in medicine is that nothing is guaranteed. It is important to note that the decision to proceed with this intervention was based on the information collected from the patient. The Data and conclusions were drawn from the patient's questionnaire, the interview, and the physical examination. Because the information was provided in large part by the patient, it cannot be guaranteed that it has not been purposely or unconsciously manipulated. Every effort has been made to obtain as much relevant data as possible for this evaluation. It is important to note that the conclusions that lead to this procedure are derived in large part from the available data. Always take into account that the treatment will also be dependent on availability of resources and existing  treatment guidelines, considered by other Pain Management Practitioners as  being common knowledge and practice, at the time of the intervention. For Medico-Legal purposes, it is also important to point out that variation in procedural techniques and pharmacological choices are the acceptable norm. The indications, contraindications, technique, and results of the above procedure should only be interpreted and judged by a Board-Certified Interventional Pain Specialist with extensive familiarity and expertise in the same exact procedure and technique.

## 2024-01-11 NOTE — Progress Notes (Signed)
 Safety precautions to be maintained throughout the outpatient stay will include: orient to surroundings, keep bed in low position, maintain call bell within reach at all times, provide assistance with transfer out of bed and ambulation.

## 2024-01-11 NOTE — Patient Instructions (Signed)
Epidural Steroid Injection ?Patient Information ? ?Description: The epidural space surrounds the nerves as they exit the spinal cord.  In some patients, the nerves can be compressed and inflamed by a bulging disc or a tight spinal canal (spinal stenosis).  By injecting steroids into the epidural space, we can bring irritated nerves into direct contact with a potentially helpful medication.  These steroids act directly on the irritated nerves and can reduce swelling and inflammation which often leads to decreased pain.  Epidural steroids may be injected anywhere along the spine and from the neck to the low back depending upon the location of your pain. ?  After numbing the skin with local anesthetic (like Novocaine), a small needle is passed into the epidural space slowly.  You may experience a sensation of pressure while this is being done.  The entire block usually last less than 10 minutes. ? ?Conditions which may be treated by epidural steroids: ? ?Low back and leg pain ?Neck and arm pain ?Spinal stenosis ?Post-laminectomy syndrome ?Herpes zoster (shingles) pain ?Pain from compression fractures ? ?Preparation for the injection: ? ?Do not eat any solid food or dairy products within 8 hours of your appointment.  ?You may drink clear liquids up to 3 hours before appointment.  Clear liquids include water, black coffee, juice or soda.  No milk or cream please. ?You may take your regular medication, including pain medications, with a sip of water before your appointment  Diabetics should hold regular insulin (if taken separately) and take 1/2 normal NPH dos the morning of the procedure.  Carry some sugar containing items with you to your appointment. ?A driver must accompany you and be prepared to drive you home after your procedure.  ?Bring all your current medications with your. ?An IV may be inserted and sedation may be given at the discretion of the physician.   ?A blood pressure cuff, EKG and other monitors will  often be applied during the procedure.  Some patients may need to have extra oxygen administered for a short period. ?You will be asked to provide medical information, including your allergies, prior to the procedure.  We must know immediately if you are taking blood thinners (like Coumadin/Warfarin)  Or if you are allergic to IV iodine contrast (dye). We must know if you could possible be pregnant. ? ?Possible side-effects: ?Bleeding from needle site ?Infection (rare, may require surgery) ?Nerve injury (rare) ?Numbness & tingling (temporary) ?Difficulty urinating (rare, temporary) ?Spinal headache ( a headache worse with upright posture) ?Light -headedness (temporary) ?Pain at injection site (several days) ?Decreased blood pressure (temporary) ?Weakness in arm/leg (temporary) ?Pressure sensation in back/neck (temporary) ? ?Call if you experience: ?Fever/chills associated with headache or increased back/neck pain. ?Headache worsened by an upright position. ?New onset weakness or numbness of an extremity below the injection site ?Hives or difficulty breathing (go to the emergency room) ?Inflammation or drainage at the infection site ?Severe back/neck pain ?Any new symptoms which are concerning to you ? ?Please note: ? ?Although the local anesthetic injected can often make your back or neck feel good for several hours after the injection, the pain will likely return.  It takes 3-7 days for steroids to work in the epidural space.  You may not notice any pain relief for at least that one week. ? ?If effective, we will often do a series of three injections spaced 3-6 weeks apart to maximally decrease your pain.  After the initial series, we generally will wait several months before   considering a repeat injection of the same type. ? ?If you have any questions, please call [phone] ?[facility] ?

## 2024-01-12 ENCOUNTER — Ambulatory Visit: Admitting: Physician Assistant

## 2024-01-16 ENCOUNTER — Other Ambulatory Visit: Payer: Self-pay

## 2024-01-16 ENCOUNTER — Other Ambulatory Visit: Payer: Self-pay | Admitting: Family Medicine

## 2024-01-16 ENCOUNTER — Other Ambulatory Visit (HOSPITAL_COMMUNITY): Payer: Self-pay

## 2024-01-16 DIAGNOSIS — M542 Cervicalgia: Secondary | ICD-10-CM

## 2024-01-16 MED ORDER — CYCLOBENZAPRINE HCL 10 MG PO TABS
10.0000 mg | ORAL_TABLET | Freq: Three times a day (TID) | ORAL | 2 refills | Status: DC | PRN
  Filled 2024-01-16: qty 90, 30d supply, fill #0
  Filled 2024-02-20: qty 90, 30d supply, fill #1
  Filled 2024-04-02: qty 90, 30d supply, fill #2

## 2024-01-26 ENCOUNTER — Encounter: Payer: Self-pay | Admitting: Family Medicine

## 2024-01-30 ENCOUNTER — Other Ambulatory Visit (HOSPITAL_COMMUNITY): Payer: Self-pay

## 2024-01-30 ENCOUNTER — Ambulatory Visit: Admitting: Family Medicine

## 2024-01-30 VITALS — BP 130/70 | HR 79 | Temp 98.1°F | Ht 63.0 in | Wt 222.6 lb

## 2024-01-30 DIAGNOSIS — I10 Essential (primary) hypertension: Secondary | ICD-10-CM

## 2024-01-30 DIAGNOSIS — R053 Chronic cough: Secondary | ICD-10-CM

## 2024-01-30 MED ORDER — METHYLPREDNISOLONE 4 MG PO TBPK
ORAL_TABLET | ORAL | 0 refills | Status: DC
  Filled 2024-01-30: qty 21, 6d supply, fill #0

## 2024-01-30 MED ORDER — HYDROCHLOROTHIAZIDE 25 MG PO TABS
25.0000 mg | ORAL_TABLET | Freq: Every day | ORAL | 1 refills | Status: DC
  Filled 2024-01-30: qty 90, 90d supply, fill #0
  Filled 2024-02-20 – 2024-05-05 (×2): qty 90, 90d supply, fill #1

## 2024-01-30 MED ORDER — MONTELUKAST SODIUM 10 MG PO TABS
10.0000 mg | ORAL_TABLET | Freq: Every day | ORAL | 3 refills | Status: DC
  Filled 2024-01-30: qty 30, 30d supply, fill #0
  Filled 2024-02-20 – 2024-02-23 (×2): qty 30, 30d supply, fill #1
  Filled 2024-04-02: qty 30, 30d supply, fill #2
  Filled 2024-05-05: qty 30, 30d supply, fill #3

## 2024-01-30 NOTE — Progress Notes (Signed)
 Established Patient Office Visit  Subjective   Patient ID: Kaitlyn Kelly, female    DOB: 11-19-1960  Age: 63 y.o. MRN: [id number]  Chief Complaint  Patient presents with   Cough    Non-productive x2 months, states she self discontinued Lisinopril /hydrochlorothiazide  1 week ago as she suspected this was the cause    States that she has had an ongoing cough for about 2 months now. States that she has been on lisinopril /hct for years, states she stopped taking it about a week ago, states she thinks her cough is slightly better, but it is still present. States that she has had some PND -- has been taking cetirizine in the mornings also, isn't really helping, still feels like there is drainage in the back of her throat. Does not feel tight in her chest, no difficulty breathing. States that her throat is not itchy or sore, no fever or chills. States that she isn't being woken up in the middle of the night, states the coughing is the worst in the morning. Also has been taking nasonex  which helped her snoring but it is not helping with the cough.     Current Outpatient Medications  Medication Instructions   albuterol  (VENTOLIN  HFA) 108 (90 Base) MCG/ACT inhaler 2 puffs, Inhalation, Every 4 hours PRN   aspirin  81 mg, Oral, 2 times daily   clobetasol  cream (TEMOVATE ) 0.05 % 1 Application, Topical, Daily PRN   cyclobenzaprine  (FLEXERIL ) 10 mg, Oral, 3 times daily PRN   docusate sodium  (COLACE) 200 mg, Every morning   hydrochlorothiazide  (HYDRODIURIL ) 25 mg, Oral, Daily   ibuprofen  (ADVIL ) 600 mg, Every 6 hours PRN   levothyroxine  (SYNTHROID ) 112 mcg, Oral, Daily before breakfast   methylPREDNISolone  (MEDROL  DOSEPAK) 4 MG TBPK tablet Take package as directed   montelukast  (SINGULAIR ) 10 mg, Oral, Daily at bedtime   tacrolimus  (PROTOPIC ) 0.1 % ointment Apply to affected areas daily   tacrolimus  (PROTOPIC ) 0.1 % ointment Apply 1 Application topically to affected area daily.    Patient Active  Problem List   Diagnosis Date Noted   Cervical facet joint syndrome 12/13/2023   Cervical myofascial pain syndrome 12/13/2023   Physical exam 02/25/2022   S/P TKR (total knee replacement), left 09/14/2021   Acquired hypothyroidism 06/23/2021   Arthralgia of multiple joints 06/23/2021   Gastroesophageal reflux disease 06/23/2021   Lumbar back sprain 06/23/2021   Menopause 06/23/2021   Osteoarthritis 06/23/2021   Acute medial meniscus tear of right knee    Vulvar irritation 03/04/2020   Lichen sclerosus 03/04/2020   Pain in both hands 01/17/2020   Vitamin D  deficiency 05/20/2009   Allergic rhinitis 05/20/2009   ELEVATED BLOOD PRESSURE WITHOUT DIAGNOSIS OF HYPERTENSION 05/20/2009   HASHIMOTO'S THYROIDITIS 12/07/2007   Obesity 06/11/2007   Essential hypertension 06/11/2007   Asthma 06/11/2007   HEMATURIA, HX OF 06/11/2007      Review of Systems  All other systems reviewed and are negative.     Objective:     BP 130/70   Pulse 79   Temp 98.1 F (36.7 C) (Oral)   Ht 5' 3 (1.6 m)   Wt 222 lb 9.6 oz (101 kg)   SpO2 99%   BMI 39.43 kg/m    Physical Exam Vitals reviewed.  Constitutional:      Appearance: Normal appearance. She is obese.  HENT:     Right Ear: Tympanic membrane normal.     Left Ear: Tympanic membrane normal.     Mouth/Throat:  Mouth: Mucous membranes are moist.     Pharynx: No oropharyngeal exudate.  Neck:     Thyroid : No thyromegaly.  Cardiovascular:     Rate and Rhythm: Normal rate and regular rhythm.     Heart sounds: Normal heart sounds.  Pulmonary:     Effort: Pulmonary effort is normal.     Breath sounds: Normal breath sounds. No wheezing or rales.  Lymphadenopathy:     Cervical: No cervical adenopathy.  Neurological:     Mental Status: She is alert and oriented to person, place, and time.  Psychiatric:        Mood and Affect: Mood normal.        Behavior: Behavior normal.      No results found for any visits on  01/30/24.    The 10-year ASCVD risk score (Arnett DK, et al., 2019) is: 4.9%    Assessment & Plan:  Chronic cough -     Montelukast  Sodium; Take 1 tablet (10 mg total) by mouth at bedtime.  Dispense: 30 tablet; Refill: 3 -     methylPREDNISolone ; Take package as directed  Dispense: 21 each; Refill: 0  Essential hypertension -     hydroCHLOROthiazide ; Take 1 tablet (25 mg total) by mouth daily.  Dispense: 90 tablet; Refill: 1   Physical exam findings are benign, most likely post nasal drip/ allergic rhinitis, has failed nasonex  and cetirizine. Will give medrol  dose pak and start singulair  once daily. She has appt with me next month so we will reassess her symptoms at that time. We will just rx the hydrochlorothiazide  for her high blood pressure and leave her off the lisinopril  for now.   No follow-ups on file.    Heron CHRISTELLA Sharper, MD

## 2024-02-01 ENCOUNTER — Ambulatory Visit
Attending: Student in an Organized Health Care Education/Training Program | Admitting: Student in an Organized Health Care Education/Training Program

## 2024-02-01 ENCOUNTER — Telehealth: Payer: Self-pay

## 2024-02-01 DIAGNOSIS — M7918 Myalgia, other site: Secondary | ICD-10-CM | POA: Diagnosis not present

## 2024-02-01 DIAGNOSIS — M47812 Spondylosis without myelopathy or radiculopathy, cervical region: Secondary | ICD-10-CM | POA: Diagnosis not present

## 2024-02-01 NOTE — Progress Notes (Signed)
 PROVIDER NOTE: Interpretation of information contained herein should be left to medically-trained personnel. Specific patient instructions are provided elsewhere under Patient Instructions section of medical record. This document was created in part using AI and STT-dictation technology, any transcriptional errors that may result from this process are unintentional.  Patient: Kaitlyn Kelly  Service: E/M   PCP: Ozell Heron HERO, MD  DOB: 11-18-1960  DOS: 02/01/2024  Provider: Wallie Sherry, MD  MRN: [id number]  Delivery: Virtual Visit  Specialty: Interventional Pain Management  Type: Established Patient  Setting: Ambulatory outpatient facility  Specialty designation: 09  Referring Prov.: Ozell Heron HERO, MD  Location: Remote location       Virtual Encounter - Pain Management PROVIDER NOTE: Information contained herein reflects review and annotations entered in association with encounter. Interpretation of such information and data should be left to medically-trained personnel. Information provided to patient can be located elsewhere in the medical record under Patient Instructions. Document created using STT-dictation technology, any transcriptional errors that may result from process are unintentional.    Contact & Pharmacy Preferred: [phone] Home: [phone] (home) Mobile: [phone] (mobile) E-mail: [email or web address]  [place] [facility] [address] [place] [address] Phone: [phone] Fax: [phone]   Pre-screening  Kaitlyn Kelly offered in-person vs virtual encounter. She indicated preferring virtual for this encounter.   Reason COVID-19*  Social distancing based on CDC and AMA recommendations.   I contacted Kaitlyn Kelly on 02/01/2024 via telephone.      I clearly identified myself as Wallie Sherry, MD. I verified that I was speaking with the correct person using two identifiers (Name: Kaitlyn Kelly, and  date of birth: 03/21/1961).  Consent I sought verbal advanced consent from Kaitlyn Kelly for virtual visit interactions. I informed Kaitlyn Kelly of possible security and privacy concerns, risks, and limitations associated with providing not-in-person medical evaluation and management services. I also informed Ms.  of the availability of in-person appointments. Finally, I informed her that there would be a charge for the virtual visit and that she could be  personally, fully or partially, financially responsible for it. Kaitlyn Kelly expressed understanding and agreed to proceed.   Historic Elements   Kaitlyn Kelly is a 63 y.o. year old, female patient evaluated today after our last contact on 01/11/2024. Kaitlyn Kelly  has a past medical history of Anal fissure, Arthritis, Asthma, Hemorrhoids, History of kidney stones, Hypertension, Hypothyroidism, Medical history non-contributory, Pneumonia, PONV (postoperative nausea and vomiting), and Thyroid  disease. She also  has a past surgical history that includes Colonoscopy; Total abdominal hysterectomy (Left, 08/30/2002); Vulva / perineum biopsy (03/08/2020); Tubal ligation; Knee arthroscopy with meniscal repair (Right, 03/30/2021); and Total knee arthroplasty (Left, 09/14/2021). Kaitlyn Kelly has a current medication list which includes the following prescription(s): albuterol , aspirin , clobetasol  cream, cyclobenzaprine , docusate sodium , hydrochlorothiazide , ibuprofen , levothyroxine , methylprednisolone , montelukast , tacrolimus , and tacrolimus . She  reports that she has quit smoking. Her smoking use included cigarettes. She has never used smokeless tobacco. She reports current alcohol use of about 1.0 - 2.0 standard drink of alcohol per week. She reports that she does not use drugs. Kaitlyn Kelly is allergic to lisinopril , shingrix [zoster vac recomb adjuvanted], and sulfonamide derivatives.  BMI: Estimated body mass index is 39.43 kg/m as calculated from the  following:   Height as of 01/30/24: 5' 3 (1.6 m).   Weight as of 01/30/24: 222 lb 9.6 oz (101 kg). Last encounter: 12/13/2023. Last procedure: 01/11/2024.  HPI  Today,  she is being contacted for a post-procedure assessment.  Post-Procedure Evaluation   Type:  Cervical Trigger Point Injection (Myoneural Block) (1-2 muscle groups)  #1 (w/o steroids)  CPT: 20552 Laterality: Bilateral (-50)   Imaging: N/A. Landmark-guided,            DOS: 01/11/2024  Performed by: Wallie Sherry, MD  Medical Necessity (reasoning)  Purpose: Diagnostic/Therapeutic Rationale (medical necessity): procedure needed and proper for the diagnosis and/or treatment of Kaitlyn Kelly medical symptoms and needs.  1. Cervical myofascial pain syndrome    NAS-11 Pain score:   Pre-procedure: 6 /10   Post-procedure: 6 /10       Effectiveness:  Initial hour after procedure:  (85 % on right side, 0% on left side)  Subsequent 4-6 hours post-procedure:  (same as above)  Analgesia past initial 6 hours:  (right side 85% lasting 2 weeks   left side 50 % lasting 2 weeks)  Ongoing improvement:  Analgesic:  right 85%, left side 50%   Laboratory Chemistry Profile   Renal Lab Results  Component Value Date   BUN 18 02/28/2023   CREATININE 1.03 02/28/2023   BCR NOT APPLICABLE 03/19/2021   GFR 59.05 (L) 02/28/2023   GFRAA 69 06/15/2020   GFRNONAA >60 09/15/2021    Hepatic Lab Results  Component Value Date   AST 19 02/28/2023   ALT 13 02/28/2023   ALBUMIN 4.5 02/28/2023   ALKPHOS 71 02/28/2023    Electrolytes Lab Results  Component Value Date   NA 137 02/28/2023   K 3.6 02/28/2023   CL 101 02/28/2023   CALCIUM 9.7 02/28/2023    Bone Lab Results  Component Value Date   VD25OH 101.51 (HH) 02/28/2023    Inflammation (CRP: Acute Phase) (ESR: Chronic Phase) No results found for: CRP, ESRSEDRATE, LATICACIDVEN       Note: Above Lab results reviewed.  Imaging  DG Cervical Spine Complete CLINICAL DATA:   Chronic neck pain.  EXAM: CERVICAL SPINE - COMPLETE 4+ VIEW  COMPARISON:  September 01, 2023  FINDINGS: There is no evidence of cervical spine fracture or prevertebral soft tissue swelling. Mild retrolisthesis C2 on C3. Mild anterolisthesis of C4 and C5. Minimal anterior spurring noted at C5, C6 and C7. Mild narrow intervertebral space at C6-7. Facet joint sclerosis noted throughout cervical spine. No other significant bone abnormalities are identified.  IMPRESSION: Degenerative joint changes of cervical spine.  Electronically Signed   By: Kaitlyn Kelly M.D.   On: 11/17/2023 15:46  Assessment  The primary encounter diagnosis was Cervical myofascial pain syndrome. Diagnoses of Cervical facet joint syndrome and Cervical spondylosis were also pertinent to this visit.  Plan of Care  Kaitlyn Kelly has a history of greater than 3 months of moderate to severe pain which is resulted in functional impairment.  The patient has tried various conservative therapeutic options such as NSAIDs, Tylenol , muscle relaxants, physical therapy which was inadequately effective.  Patient's pain is predominantly axial with physical exam and C-XRAY findings suggestive of facet arthropathy. Cervical facet medial branch nerve blocks were discussed with the patient.  Risks and benefits were reviewed.  Patient would like to proceed with bilateral C4, C5, C6, C7 medial branch nerve block.  1. Cervical myofascial pain syndrome (Primary)  2. Cervical facet joint syndrome - CERVICAL FACET (MEDIAL BRANCH NERVE BLOCK) ; Future  3. Cervical spondylosis - CERVICAL FACET (MEDIAL BRANCH NERVE BLOCK) ; Future   Orders Placed This Encounter  Procedures   CERVICAL FACET (MEDIAL BRANCH NERVE  BLOCK)     Standing Status:   Future    Expiration Date:   05/03/2024    Scheduling Instructions:     Procedure: Cervical facet Block     Type: Medial Branch Block     Side: Bilateral     Purpose: Diagnostic Radiologic  Mapping     Level(s): C3-4, C4-5, C5-6,by Fluoroscopic Pain Mapping Facet joints (C4, C5, C6, Medial Branch Nerves)     Sedation: IV Versed      Timeframe: As soon as schedule allows.    Where will this procedure be performed?:   [facility]     Follow-up plan:   Return in about 20 days (around 02/21/2024) for B/L C4, 5, 6 MBNB , in clinic IV Versed .      Recent Visits Date Type Provider Dept  01/11/24 Procedure visit Marcelino Nurse, MD [facility]  12/13/23 Office Visit Marcelino Nurse, MD [facility]  Showing recent visits within past 90 days and meeting all other requirements Today's Visits Date Type Provider Dept  02/01/24 Office Visit Marcelino Nurse, MD [facility]  Showing today's visits and meeting all other requirements Future Appointments No visits were found meeting these conditions. Showing future appointments within next 90 days and meeting all other requirements  I discussed the assessment and treatment plan with the patient. The patient was provided an opportunity to ask questions and all were answered. The patient agreed with the plan and demonstrated an understanding of the instructions.  Patient advised to call back or seek an in-person evaluation if the symptoms or condition worsens.  Duration of encounter: 20 minutes.  Note by: Nurse Marcelino, MD Date: 02/01/2024; Time: 3:11 PM

## 2024-02-01 NOTE — Telephone Encounter (Signed)
LM for patient to call back for pre virtual appointment questions.  °

## 2024-02-02 ENCOUNTER — Encounter (HOSPITAL_COMMUNITY): Payer: Self-pay

## 2024-02-02 ENCOUNTER — Ambulatory Visit (HOSPITAL_COMMUNITY): Attending: Physician Assistant

## 2024-02-02 DIAGNOSIS — M542 Cervicalgia: Secondary | ICD-10-CM | POA: Insufficient documentation

## 2024-02-02 DIAGNOSIS — R29898 Other symptoms and signs involving the musculoskeletal system: Secondary | ICD-10-CM | POA: Insufficient documentation

## 2024-02-02 NOTE — Therapy (Signed)
 OUTPATIENT PHYSICAL THERAPY CERVICAL TREATMENT   Patient Name: Kaitlyn Kelly MRN: [id number] DOB:1961-04-11, 63 y.o., female Today's Date: 02/02/2024  END OF SESSION:  PT End of Session - 02/02/24 1351     Visit Number 2    Number of Visits 13    Date for PT Re-Evaluation 02/16/24    Authorization Type [place] [employer]    Authorization Time Period no auth    Progress Note Due on Visit 10    PT Start Time 1350    PT Stop Time 1428    PT Time Calculation (min) 38 min    Activity Tolerance Patient tolerated treatment well    Behavior During Therapy WFL for tasks assessed/performed          Past Medical History:  Diagnosis Date   Anal fissure    related to Lichens Sclerosis   Arthritis    Asthma    Hemorrhoids    History of kidney stones    Hypertension    Hypothyroidism    Medical history non-contributory    Pneumonia    PONV (postoperative nausea and vomiting)    Thyroid  disease    Past Surgical History:  Procedure Laterality Date   COLONOSCOPY     had a 2014, 2017   KNEE ARTHROSCOPY WITH MENISCAL REPAIR Right 03/30/2021   Procedure: right knee meniscal root tear repair; arthroscopy;  Surgeon: Addie Cordella Hamilton, MD;  Location: [facility] OR;  Service: Orthopedics;  Laterality: Right;   TOTAL ABDOMINAL HYSTERECTOMY Left 08/30/2002   TAH/RSO/LO cystectomy for bleeding, bilateral cysts   TOTAL KNEE ARTHROPLASTY Left 09/14/2021   Procedure: LEFT TOTAL KNEE ARTHROPLASTY;  Surgeon: Addie Cordella Hamilton, MD;  Location: [facility] OR;  Service: Orthopedics;  Laterality: Left;   TUBAL LIGATION     VULVA /PERINEUM BIOPSY  03/08/2020       Patient Active Problem List   Diagnosis Date Noted   Cervical facet joint syndrome 12/13/2023   Cervical myofascial pain syndrome 12/13/2023   Physical exam 02/25/2022   S/P TKR (total knee replacement), left 09/14/2021   Acquired hypothyroidism 06/23/2021   Arthralgia of multiple joints 06/23/2021   Gastroesophageal reflux disease 06/23/2021    Lumbar back sprain 06/23/2021   Menopause 06/23/2021   Osteoarthritis 06/23/2021   Acute medial meniscus tear of right knee    Vulvar irritation 03/04/2020   Lichen sclerosus 03/04/2020   Pain in both hands 01/17/2020   Vitamin D  deficiency 05/20/2009   Allergic rhinitis 05/20/2009   ELEVATED BLOOD PRESSURE WITHOUT DIAGNOSIS OF HYPERTENSION 05/20/2009   HASHIMOTO'S THYROIDITIS 12/07/2007   Obesity 06/11/2007   Essential hypertension 06/11/2007   Asthma 06/11/2007   HEMATURIA, HX OF 06/11/2007    PCP: Ozell Heron HERO, MD  REFERRING PROVIDER: Ulis Bottcher, PA-C  REFERRING DIAG: M54.2 (ICD-10-CM) - Neck pain   THERAPY DIAG:  Neck pain  Decreased range of motion of neck  Rationale for Evaluation and Treatment: Rehabilitation  ONSET DATE: ~ a couple of years  SUBJECTIVE:  SUBJECTIVE STATEMENT: 02/02/24:  Received shot in neck 3 weeks ago, some relief lasted for a week and half then pain returned to normal.  Current pain scale 4/10, increased pain with neck movements, sore and stiff.  Has been compliant with HEP and has been working on her posture at home.  Eval:  Neck pain that has been going on for a couple of years. It been getting worse. Pt reporting that the pain is waking her up at night. Pt has tried postural pillow to assist. She reports knot feelings. Pt reports twisting in certain directions in painful. Has adjusted her laptop at work to ease strain. Uses heat. 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  Hand dominance: Right  PERTINENT HISTORY:    PAIN:  Are you having pain? Yes: NPRS scale: 1/10; Worst 7-8/10 Pain location: Neck, bilateral upper trapezius, lateral aspect of necks. Pt pointing to suboccipital, upper trapezius  Pain  description: constant, achy, occasionally sharp.  Aggravating factors: Sleeping  Relieving factors: heat  PRECAUTIONS: None  RED FLAGS: Cervical red flags: Dysphagia No, Dysmetria No, Diplopia No, Nystagmus No, and Nausea No     WEIGHT BEARING RESTRICTIONS: No  FALLS:  Has patient fallen in last 6 months? No   PATIENT GOALS: like to be out of pain    OBJECTIVE:  Note: Objective measures were completed at Evaluation unless otherwise noted.  DIAGNOSTIC FINDINGS:  CCLINICAL DATA:  Chronic neck pain.   EXAM: CERVICAL SPINE - COMPLETE 4+ VIEW   COMPARISON:  September 01, 2023   FINDINGS: There is no evidence of cervical spine fracture or prevertebral soft tissue swelling. Mild retrolisthesis C2 on C3. Mild anterolisthesis of C4 and C5. Minimal anterior spurring noted at C5, C6 and C7. Mild narrow intervertebral space at C6-7. Facet joint sclerosis noted throughout cervical spine. No other significant bone abnormalities are identified.   IMPRESSION: Degenerative joint changes of cervical spine.  PATIENT SURVEYS:  NDI: 9/50 = 18%  COGNITION: Overall cognitive status: Within functional limits for tasks assessed  SENSATION: WFL  POSTURE: rounded shoulders, forward head, and increased thoracic kyphosis  PALPATION: Tender to palpate along bilateral UT with multiple muscle fasciculations/trigger points.  Hypomobility segmentally at  C2-C5   CERVICAL ROM:   Active ROM A/PROM (deg) eval  Flexion 31  Extension 31  Right lateral flexion 8  Left lateral flexion 12  Right rotation 31  Left rotation 31   (Blank rows = not tested)  UPPER EXTREMITY ROM:  Active ROM Right eval Left eval  Shoulder flexion 140 140  Shoulder extension    Shoulder abduction 130 130  Shoulder adduction    Shoulder extension    Shoulder internal rotation    Shoulder external rotation    Elbow flexion    Elbow extension    Wrist flexion    Wrist extension    Wrist ulnar  deviation    Wrist radial deviation    Wrist pronation    Wrist supination     (Blank rows = not tested)  UPPER EXTREMITY MMT:  MMT Right eval Left eval  Shoulder flexion 4- 4-  Shoulder extension    Shoulder abduction 4- 4+  Shoulder adduction    Shoulder extension    Shoulder internal rotation    Shoulder external rotation 4- 4-  Middle trapezius    Lower trapezius    Elbow flexion    Elbow extension    Wrist flexion    Wrist extension  Wrist ulnar deviation    Wrist radial deviation    Wrist pronation    Wrist supination    Grip strength     (Blank rows = not tested)  CERVICAL SPECIAL TESTS:  Neck flexor muscle endurance test: Positive, Spurling's test: Positive, and Distraction test: Negative   TREATMENT DATE:  02/02/24: Reviewed goals  Educated imoprtance of HEP complaince for maximal benefits- pt able to recall and describe current exercise program Educated importance of posture Discussed dry needling- pt reports she has been given flier/information.  Seated: Cervical retraction Scapular retraction 3D cervical excursion 5x 10 pain free range Manual in supine position with LE elevated on wedge focus with PROM pain free, STM to upper trap, scalenes and SCM, manual traction and suboccipital release  Trigger Point Dry Needling  What is Trigger Point Dry Needling (DN)? DN is a physical therapy technique used to treat muscle pain and dysfunction. Specifically, DN helps deactivate muscle trigger points (muscle knots).  A thin filiform needle is used to penetrate the skin and stimulate the underlying trigger point. The goal is for a local twitch response (LTR) to occur and for the trigger point to relax. No medication of any kind is injected during the procedure.  Benefits of Trigger Point Dry Needling Reduces localized tension and stiffness promoting muscle function. Promotes range of motion and flexibility of the area being treated. Relieves localized and  referred muscle related pain.  Promotes localized blood flow. Benefits of DN increase when performed with formal therapy including strengthening and stretching.   What Does Trigger Point Dry Needling Feel Like?  The procedure feels different for each individual patient. Some patients report that they do not actually feel the needle enter the skin and overall the process is not painful. Very mild bleeding may occur. However, many patients feel a deep cramping in the muscle in which the needle was inserted. This is the local twitch response.   How Will I feel after the treatment? Soreness is normal, and the onset of soreness may not occur for a few hours. Typically this soreness does not last longer than two days.  Bruising is uncommon, however; ice can be used to decrease any possible bruising.  In rare cases feeling tired or nauseous after the treatment is normal. In addition, your symptoms may get worse before they get better, this period will typically not last longer than 24 hours.   What Can I do After My Treatment? Increase your hydration by drinking more water  for the next 24 hours.  You may place ice or heat on the areas treated that have become sore, however, do not use heat on inflamed or bruised areas. Heat often brings more relief post needling. You can continue your regular activities, but vigorous activity is not recommended initially after the treatment for 24 hours. DN is best combined with other physical therapy such as strengthening, stretching, and other therapies.   What are the complications? While your therapist has had extensive training in minimizing the risks of trigger point dry needling, it is important to understand the risks of any procedure.  Risks include bleeding, pain, fatigue, hematoma, infection, vertigo, nausea or nerve involvement. Monitor for any changes to your skin or sensation. Contact your therapist or MD with concerns.  A rare but serious complication is  a pneumothorax over or near your middle and upper chest and back If you have dry needling in this area, monitor for the following symptoms: Shortness of breath on exertion and/or Difficulty  taking a deep breath and/or Chest Pain and/or A dry cough If any of the above symptoms develop, please go to the nearest emergency room or call 911. Tell them you had dry needling over your thorax and report any symptoms you are having. Please follow-up with your treating therapist after you complete the medical evaluation.    01/05/2024 PT Evaluation and HEP                                                                                                                                 PATIENT EDUCATION:  Education details: PT Evaluation, findings, prognosis, frequency, attendance policy, and HEP. Person educated: Patient Education method: Medical illustrator Education comprehension: verbalized understanding  HOME EXERCISE PROGRAM: Access Code: [email or web address] URL: [email or web address] Date: 01/05/2024 Prepared by: Omega Bottcher  Exercises - Seated Assisted Cervical Rotation with Towel  - 1 x daily - 7 x weekly - 3 sets - 12-15 reps - Cervical Extension AROM with Strap  - 1 x daily - 7 x weekly - 3 sets - 12-15 reps - Seated Upper Trapezius Stretch  - 1 x daily - 7 x weekly - 3 sets - 2-3 reps - 30 hold - Seated Scapular Retraction  - 1 x daily - 7 x weekly - 3 sets - 10 reps  ASSESSMENT:  CLINICAL IMPRESSION: 02/02/24:  Reviewed goals and educated importance of HEP compliance for maximal benefits.  Pt able to recall and demonstrate appropriate mechanics with current exercise program.  Educated importance of posture for pain control.  Min cueing to improve cervical retraction and reduce extension.  EOS with manual STM and PROM, pt with moderate spasms on Lt UT and scalenes that was able to reduce though unable to fully resolve spasm.  Discussed benefits with dry needling.  Pt  presents with improve cervical mobility and pain reduced following massage, encouraged to stay hydrated following manual to reduce risk of headaches following.    Eval:  Patient is a 63y.o. female who was seen today for physical therapy evaluation and treatment for M54.2 (ICD-10-CM) - Neck pain.  Patient with chronic neck pain over the past few years.  Observation demonstrating significant upper body crush syndrome with rounded shoulders, forward neck increased thoracic kyphosis.  Pain primarily located along bilateral proximal upper trapezius and lateral aspect of cervical spine musculature.  Patient tender to palpate in bilateral upper traps.  Patient with significant range of motion limitations, reduced upper body muscle strength and postural musculature, and constant pain which is limiting patient's functional ADLs involving neck mobility for driving and various job functions. Pt will benefit from skilled Physical Therapy services to address deficits/limitations in order to improve functional and QOL.    OBJECTIVE IMPAIRMENTS: decreased ROM, decreased strength, hypomobility, postural dysfunction, and pain.   ACTIVITY LIMITATIONS: reach over head and driving, reaching  PARTICIPATION LIMITATIONS: cleaning, community activity, occupation, and yard work  PERSONAL FACTORS: Age are also affecting patient's functional  outcome.   REHAB POTENTIAL: Excellent  CLINICAL DECISION MAKING: Stable/uncomplicated  EVALUATION COMPLEXITY: Low   GOALS: Goals reviewed with patient? No  SHORT TERM GOALS: Target date: 01/26/24  Pt will be independent with HEP in order to demonstrate participation in Physical Therapy POC.  Baseline: Goal status: INITIAL  2.  Pt will report 3/10 pain scale during UB ADLs in order to reduce pain and to optimize function.  Baseline:  Goal status: INITIAL  LONG TERM GOALS: Target date: 02/16/24  Pt will improve Deep Neck Flexor Endurance test by 10 seconds in order to  demonstrate improved functional strength to return to desired activities.  Baseline: see objective.  Goal status: INITIAL  2.  Pt will improve Cervical ROM by at least 10 degrees in order to improve functional mobility during ADLs.  Baseline: see objective.  Goal status: INITIAL  3.  Pt will improve NDI score by 5 points in order to demonstrate improved pain with functional goals and outcomes. Baseline: see objective.  Goal status: INITIAL  4.  Pt will report 0/10 pain scale during UB ADLs in order to reduce pain and to optimize function lasting greater than > 30 minutes.  Baseline: see objective.  Goal status: INITIAL  5.  Pt will improve upper body MMT by at least one half muscle grade to improve postural endurance and tolerance during ADLs. Baseline: see objective.  Goal status: INITIAL   PLAN:  PT FREQUENCY: 1-2x/week  PT DURATION: 6 weeks  PLANNED INTERVENTIONS: 97164- PT Re-evaluation, 97110-Therapeutic exercises, 97530- Therapeutic activity, [id number]- Neuromuscular re-education, 97535- Self Care, [id number]- Manual therapy, [id number]- Electrical stimulation (unattended), [id number]- Electrical stimulation (manual), and 20560 (1-2 muscles), 20561 (3+ muscles)- Dry Needling  PLAN FOR NEXT SESSION: Discussed dry needling with patient, soft tissue mobility upper traps and suboccipitals.  Postural strengthening.  Cervical range of motion.  Augustin Mclean, LPTA/CLT; CBIS [phone]  Mclean Augustin Amble, PTA 02/02/2024, 4:53 PM

## 2024-02-09 ENCOUNTER — Encounter (HOSPITAL_COMMUNITY)

## 2024-02-16 ENCOUNTER — Encounter (HOSPITAL_COMMUNITY)

## 2024-02-21 ENCOUNTER — Other Ambulatory Visit: Payer: Self-pay

## 2024-02-21 ENCOUNTER — Ambulatory Visit: Admitting: Student in an Organized Health Care Education/Training Program

## 2024-02-21 ENCOUNTER — Other Ambulatory Visit (HOSPITAL_COMMUNITY): Payer: Self-pay

## 2024-02-23 ENCOUNTER — Other Ambulatory Visit: Payer: Self-pay

## 2024-02-23 ENCOUNTER — Encounter (HOSPITAL_COMMUNITY)

## 2024-02-26 ENCOUNTER — Other Ambulatory Visit (HOSPITAL_COMMUNITY): Payer: Self-pay

## 2024-02-27 ENCOUNTER — Ambulatory Visit: Admitting: Physician Assistant

## 2024-03-01 ENCOUNTER — Encounter: Payer: Self-pay | Admitting: Family Medicine

## 2024-03-01 ENCOUNTER — Ambulatory Visit (INDEPENDENT_AMBULATORY_CARE_PROVIDER_SITE_OTHER): Payer: Commercial Managed Care - PPO | Admitting: Family Medicine

## 2024-03-01 ENCOUNTER — Encounter (HOSPITAL_COMMUNITY)

## 2024-03-01 VITALS — BP 118/84 | HR 80 | Temp 97.9°F | Ht 65.0 in | Wt 220.4 lb

## 2024-03-01 DIAGNOSIS — Z13220 Encounter for screening for lipoid disorders: Secondary | ICD-10-CM | POA: Diagnosis not present

## 2024-03-01 DIAGNOSIS — E039 Hypothyroidism, unspecified: Secondary | ICD-10-CM

## 2024-03-01 DIAGNOSIS — E66812 Obesity, class 2: Secondary | ICD-10-CM

## 2024-03-01 DIAGNOSIS — J3089 Other allergic rhinitis: Secondary | ICD-10-CM | POA: Diagnosis not present

## 2024-03-01 DIAGNOSIS — I10 Essential (primary) hypertension: Secondary | ICD-10-CM | POA: Diagnosis not present

## 2024-03-01 DIAGNOSIS — E559 Vitamin D deficiency, unspecified: Secondary | ICD-10-CM

## 2024-03-01 DIAGNOSIS — J45909 Unspecified asthma, uncomplicated: Secondary | ICD-10-CM

## 2024-03-01 DIAGNOSIS — Z6836 Body mass index (BMI) 36.0-36.9, adult: Secondary | ICD-10-CM | POA: Diagnosis not present

## 2024-03-01 DIAGNOSIS — Z0000 Encounter for general adult medical examination without abnormal findings: Secondary | ICD-10-CM

## 2024-03-01 LAB — COMPREHENSIVE METABOLIC PANEL WITH GFR
ALT: 27 U/L (ref 0–35)
AST: 27 U/L (ref 0–37)
Albumin: 4.2 g/dL (ref 3.5–5.2)
Alkaline Phosphatase: 67 U/L (ref 39–117)
BUN: 16 mg/dL (ref 6–23)
CO2: 31 meq/L (ref 19–32)
Calcium: 9.7 mg/dL (ref 8.4–10.5)
Chloride: 102 meq/L (ref 96–112)
Creatinine, Ser: 1.07 mg/dL (ref 0.40–1.20)
GFR: 56.02 mL/min — ABNORMAL LOW (ref >60.00)
Glucose, Bld: 102 mg/dL — ABNORMAL HIGH (ref 70–99)
Potassium: 3.9 meq/L (ref 3.5–5.1)
Sodium: 140 meq/L (ref 135–145)
Total Bilirubin: 0.5 mg/dL (ref 0.2–1.2)
Total Protein: 7.7 g/dL (ref 6.0–8.3)

## 2024-03-01 LAB — CBC WITH DIFFERENTIAL/PLATELET
Basophils Absolute: 0 K/uL (ref 0.0–0.1)
Basophils Relative: 0.5 % (ref 0.0–3.0)
Eosinophils Absolute: 0.1 K/uL (ref 0.0–0.7)
Eosinophils Relative: 2 % (ref 0.0–5.0)
HCT: 48.5 % — ABNORMAL HIGH (ref 36.0–46.0)
Hemoglobin: 16.1 g/dL — ABNORMAL HIGH (ref 12.0–15.0)
Lymphocytes Relative: 29.5 % (ref 12.0–46.0)
Lymphs Abs: 2 K/uL (ref 0.7–4.0)
MCHC: 33.1 g/dL (ref 30.0–36.0)
MCV: 88.2 fl (ref 78.0–100.0)
Monocytes Absolute: 0.5 K/uL (ref 0.1–1.0)
Monocytes Relative: 7 % (ref 3.0–12.0)
Neutro Abs: 4.1 K/uL (ref 1.4–7.7)
Neutrophils Relative %: 61 % (ref 43.0–77.0)
Platelets: 280 K/uL (ref 150.0–400.0)
RBC: 5.5 Mil/uL — ABNORMAL HIGH (ref 3.87–5.11)
RDW: 14.1 % (ref 11.5–15.5)
WBC: 6.8 K/uL (ref 4.0–10.5)

## 2024-03-01 LAB — LIPID PANEL
Cholesterol: 150 mg/dL (ref 0–200)
HDL: 42.7 mg/dL (ref >39.00)
LDL Cholesterol: 79 mg/dL (ref 0–99)
NonHDL: 107.04
Total CHOL/HDL Ratio: 4
Triglycerides: 139 mg/dL (ref 0.0–149.0)
VLDL: 27.8 mg/dL (ref 0.0–40.0)

## 2024-03-01 LAB — VITAMIN D 25 HYDROXY (VIT D DEFICIENCY, FRACTURES): VITD: 39.94 ng/mL (ref 30.00–100.00)

## 2024-03-01 LAB — HEMOGLOBIN A1C: Hgb A1c MFr Bld: 6.1 % (ref 4.6–6.5)

## 2024-03-01 LAB — TSH: TSH: 3.21 u[IU]/mL (ref 0.35–5.50)

## 2024-03-01 NOTE — Patient Instructions (Signed)

## 2024-03-01 NOTE — Progress Notes (Signed)
 Complete physical exam  Patient: Kaitlyn Kelly   DOB: Sep 06, 1960   63 y.o. Female  MRN: [id number]  Subjective:    Chief Complaint  Patient presents with   Annual Exam    Kaitlyn Kelly is a 63 y.o. female who presents today for a complete physical exam. She reports consuming a general diet, eats good sources of fiber, good sources of protein, eats diary. Home exercise routine includes walking 1-2 hrs per week. She generally feels well. She reports sleeping fairly well. She does not have additional problems to discuss today.    Most recent fall risk assessment:    01/11/2024    8:15 AM  Fall Risk   Falls in the past year? 0     Most recent depression screenings:    03/01/2024    8:00 AM 01/11/2024    8:15 AM  PHQ 2/9 Scores  PHQ - 2 Score 0 0  PHQ- 9 Score 0     Vision:Not within last year  and will be seen before the end of the year and Dental: No current dental problems and Receives regular dental care  Patient Active Problem List   Diagnosis Date Noted   Cervical facet joint syndrome 12/13/2023   Cervical myofascial pain syndrome 12/13/2023   Physical exam 02/25/2022   S/P TKR (total knee replacement), left 09/14/2021   Acquired hypothyroidism 06/23/2021   Arthralgia of multiple joints 06/23/2021   Gastroesophageal reflux disease 06/23/2021   Lumbar back sprain 06/23/2021   Menopause 06/23/2021   Osteoarthritis 06/23/2021   Acute medial meniscus tear of right knee    Vulvar irritation 03/04/2020   Lichen sclerosus 03/04/2020   Pain in both hands 01/17/2020   Vitamin D deficiency 05/20/2009   Allergic rhinitis 05/20/2009   ELEVATED BLOOD PRESSURE WITHOUT DIAGNOSIS OF HYPERTENSION 05/20/2009   HASHIMOTO'S THYROIDITIS 12/07/2007   Obesity 06/11/2007   Essential hypertension 06/11/2007   Asthma 06/11/2007   HEMATURIA, HX OF 06/11/2007      Patient Care Team: Ozell Heron HERO, MD as PCP - General (Family Medicine)   Outpatient Medications Prior to  Visit  Medication Sig   albuterol (VENTOLIN HFA) 108 (90 Base) MCG/ACT inhaler Inhale 2 puffs into the lungs every 4 (four) hours as needed.   aspirin 81 MG chewable tablet Chew 1 tablet (81 mg total) by mouth 2 (two) times daily.   clobetasol cream (TEMOVATE) 0.05 % Apply 1 Application topically daily as needed (lichen sclerosus).   cyclobenzaprine (FLEXERIL) 10 MG tablet Take 1 tablet (10 mg total) by mouth 3 (three) times daily as needed for muscle spasms.   docusate sodium (COLACE) 100 MG capsule Take 200 mg by mouth in the morning.   hydrochlorothiazide (HYDRODIURIL) 25 MG tablet Take 1 tablet (25 mg total) by mouth daily.   ibuprofen (ADVIL) 600 MG tablet Take 600 mg by mouth every 6 (six) hours as needed (night for neck).   levothyroxine (SYNTHROID) 112 MCG tablet Take 1 tablet (112 mcg total) by mouth daily before breakfast.   montelukast (SINGULAIR) 10 MG tablet Take 1 tablet (10 mg total) by mouth at bedtime.   tacrolimus (PROTOPIC) 0.1 % ointment Apply 1 Application topically to affected area daily.   [DISCONTINUED] methylPREDNISolone (MEDROL DOSEPAK) 4 MG TBPK tablet Take package as directed   [DISCONTINUED] tacrolimus (PROTOPIC) 0.1 % ointment Apply to affected areas daily   No facility-administered medications prior to visit.    Review of Systems  HENT:  Negative for hearing loss.  Eyes:  Negative for blurred vision.  Respiratory:  Negative for shortness of breath.   Cardiovascular:  Negative for chest pain.  Gastrointestinal: Negative.   Genitourinary: Negative.   Musculoskeletal:  Negative for back pain.  Neurological:  Negative for headaches.  Psychiatric/Behavioral:  Negative for depression.        Objective:     BP 118/84   Pulse 80   Temp 97.9 F (36.6 C) (Oral)   Ht 5' 5 (1.651 m)   Wt 220 lb 6.4 oz (100 kg)   SpO2 96%   BMI 36.68 kg/m    Physical Exam Vitals reviewed.  Constitutional:      Appearance: Normal appearance. She is well-groomed. She  is obese.  HENT:     Right Ear: Tympanic membrane and ear canal normal.     Left Ear: Tympanic membrane and ear canal normal.     Mouth/Throat:     Mouth: Mucous membranes are moist.     Pharynx: No posterior oropharyngeal erythema.  Eyes:     Conjunctiva/sclera: Conjunctivae normal.  Neck:     Thyroid: No thyromegaly.  Cardiovascular:     Rate and Rhythm: Normal rate and regular rhythm.     Pulses: Normal pulses.     Heart sounds: S1 normal and S2 normal.  Pulmonary:     Effort: Pulmonary effort is normal.     Breath sounds: Normal breath sounds and air entry.  Abdominal:     General: Abdomen is flat. Bowel sounds are normal.     Palpations: Abdomen is soft.  Musculoskeletal:     Right lower leg: No edema.     Left lower leg: No edema.  Lymphadenopathy:     Cervical: No cervical adenopathy.  Neurological:     Mental Status: She is alert and oriented to person, place, and time. Mental status is at baseline.     Gait: Gait is intact.  Psychiatric:        Mood and Affect: Mood and affect normal.        Speech: Speech normal.        Behavior: Behavior normal.        Judgment: Judgment normal.      No results found for any visits on 03/01/24.     Assessment & Plan:    Routine Health Maintenance and Physical Exam  Immunization History  Administered Date(s) Administered   Fluzone Influenza virus vaccine,trivalent (IIV3), split virus 03/25/2014, 06/24/2016, 04/08/2019   Influenza Split 05/01/2012   Influenza Whole 04/18/2007, 05/13/2009   Influenza,inj,Quad PF,6+ Mos 05/01/2020, 04/30/2021   Influenza-Unspecified 04/24/2023   MMR 11/24/2017   Moderna SARS-COV2 Booster Vaccination 06/11/2021   PFIZER(Purple Top)SARS-COV-2 Vaccination 07/10/2019, 07/31/2019, 04/24/2020   Pneumococcal Conjugate-13 03/25/2016   Pneumococcal Polysaccharide-23 06/01/2012   Td 04/17/2006   Tdap 03/25/2016   Zoster Recombinant(Shingrix) 12/17/2019   Zoster, Live 01/03/2020    Health  Maintenance  Topic Date Due   Pneumococcal Vaccine: 50+ Years (3 of 3 - PCV20 or PCV21) 06/01/2017   INFLUENZA VACCINE  02/16/2024   Colonoscopy  09/16/2025   MAMMOGRAM  11/26/2025   DTaP/Tdap/Td (3 - Td or Tdap) 03/25/2026   HIV Screening  Completed   Hepatitis B Vaccines 19-59 Average Risk  Aged Out   HPV VACCINES  Aged Out   Meningococcal B Vaccine  Aged Out   Pneumococcal Vaccine  Discontinued   COVID-19 Vaccine  Discontinued   Hepatitis C Screening  Discontinued   Zoster Vaccines- Shingrix  Discontinued  Discussed health benefits of physical activity, and encouraged her to engage in regular exercise appropriate for her age and condition.  Non-seasonal allergic rhinitis due to other allergic trigger -     Ambulatory referral to Allergy  Mild asthma without complication, unspecified whether persistent -     Ambulatory referral to Allergy  Lipid screening -     Lipid panel; Future  Vitamin D deficiency -     VITAMIN D 25 Hydroxy (Vit-D Deficiency, Fractures); Future  Class 2 severe obesity with serious comorbidity and body mass index (BMI) of 36.0 to 36.9 in adult, unspecified obesity type (HCC) -     Hemoglobin A1c; Future  Acquired hypothyroidism -     TSH; Future  Essential hypertension -     Comprehensive metabolic panel with GFR; Future  Routine general medical examination at a health care facility -     CBC with Differential/Platelet; Future  Normal physical exam findings. I counseled the patient on the recommended amount of exercise per CDC recommendation. I reviewed preventative screening, immunizations, and medical history and updated in the chart, and appropriate labs and vaccinations were ordered. Handouts given on healthy eating and exercise.    Return in 6 months (on 09/01/2024).     Heron CHRISTELLA Sharper, MD

## 2024-03-04 ENCOUNTER — Ambulatory Visit: Payer: Self-pay | Admitting: Family Medicine

## 2024-03-05 ENCOUNTER — Other Ambulatory Visit (HOSPITAL_COMMUNITY): Payer: Self-pay

## 2024-03-05 DIAGNOSIS — L408 Other psoriasis: Secondary | ICD-10-CM | POA: Diagnosis not present

## 2024-03-05 MED ORDER — TACROLIMUS 0.1 % EX OINT
1.0000 | TOPICAL_OINTMENT | Freq: Every day | CUTANEOUS | 3 refills | Status: AC
  Filled 2024-03-05: qty 60, 60d supply, fill #0

## 2024-03-08 ENCOUNTER — Encounter (HOSPITAL_COMMUNITY)

## 2024-03-21 ENCOUNTER — Other Ambulatory Visit (HOSPITAL_COMMUNITY): Payer: Self-pay

## 2024-03-21 ENCOUNTER — Other Ambulatory Visit: Payer: Self-pay | Admitting: Family Medicine

## 2024-03-21 ENCOUNTER — Other Ambulatory Visit: Payer: Self-pay

## 2024-03-21 DIAGNOSIS — I10 Essential (primary) hypertension: Secondary | ICD-10-CM

## 2024-03-21 MED ORDER — LEVOTHYROXINE SODIUM 112 MCG PO TABS
112.0000 ug | ORAL_TABLET | Freq: Every day | ORAL | 0 refills | Status: DC
  Filled 2024-03-21: qty 90, 90d supply, fill #0

## 2024-03-22 ENCOUNTER — Other Ambulatory Visit: Payer: Self-pay

## 2024-03-22 ENCOUNTER — Other Ambulatory Visit (HOSPITAL_COMMUNITY): Payer: Self-pay

## 2024-03-22 ENCOUNTER — Encounter: Payer: Self-pay | Admitting: Internal Medicine

## 2024-03-22 ENCOUNTER — Ambulatory Visit (INDEPENDENT_AMBULATORY_CARE_PROVIDER_SITE_OTHER): Admitting: Internal Medicine

## 2024-03-22 VITALS — HR 106 | Temp 98.1°F | Ht 65.0 in | Wt 222.1 lb

## 2024-03-22 DIAGNOSIS — R0982 Postnasal drip: Secondary | ICD-10-CM | POA: Diagnosis not present

## 2024-03-22 DIAGNOSIS — J3089 Other allergic rhinitis: Secondary | ICD-10-CM | POA: Diagnosis not present

## 2024-03-22 DIAGNOSIS — R053 Chronic cough: Secondary | ICD-10-CM | POA: Diagnosis not present

## 2024-03-22 DIAGNOSIS — J4520 Mild intermittent asthma, uncomplicated: Secondary | ICD-10-CM

## 2024-03-22 MED ORDER — ALBUTEROL SULFATE HFA 108 (90 BASE) MCG/ACT IN AERS
2.0000 | INHALATION_SPRAY | RESPIRATORY_TRACT | 1 refills | Status: AC | PRN
  Filled 2024-03-22: qty 6.7, 17d supply, fill #0
  Filled 2024-05-05: qty 6.7, 17d supply, fill #1

## 2024-03-22 MED ORDER — FLUTICASONE-SALMETEROL 115-21 MCG/ACT IN AERO
2.0000 | INHALATION_SPRAY | Freq: Two times a day (BID) | RESPIRATORY_TRACT | 5 refills | Status: AC
  Filled 2024-03-22: qty 12, 30d supply, fill #0
  Filled 2024-05-05: qty 12, 30d supply, fill #1
  Filled 2024-06-04: qty 12, 30d supply, fill #2
  Filled 2024-08-02: qty 12, 30d supply, fill #3

## 2024-03-22 NOTE — Progress Notes (Signed)
 NEW PATIENT  Date of Service/Encounter:  03/22/24  Consult requested by: Ozell Heron HERO, MD   Subjective:   Kaitlyn Kelly (DOB: 1960-08-13) is a 63 y.o. female who presents to the clinic on 03/22/2024 with a chief complaint of Allergic Rhinitis  (Pt is here today to establish care for allergies- she has had an ongoing cough, originally she was on lisinopril , which she has stopped- cough reduced/improved but she has ongoing coughing with a lot of mucus. Her MD advised zyrtec AM and singular PM w/ nasonex . Pt said this is not working and she is still having a lot of mucus drainage which is in the back of her throat, not her nose, she has a productive cough and at times green phlegm comes up with cough. This is disturbing her during the night. ) and Cough (Pt has a history of asthma but she says this cough is not related to her asthma. ) .    History obtained from: chart review and patient.   Asthma:  Was having trouble with chest tightness, bronchitis, SOB as an adult and underwent PFT and showed obstruction and was diagnosed with asthma.  Now doing well overall. Once a year, randomly wakes up, short of breath and needs her Albuterol .  Also sometimes in Winter needs it especially with illness.  Previously on Advair , only used it once when she had COVID a while back.  Now having trouble with cough and post nasal drip, does not feel like her asthma.   Rarely daytime symptoms in past month, none nighttime awakenings in past month Using rescue inhaler: usually few times in Winter  Limitations to daily activity: none 0 ED visits/UC visits and 1 oral steroids in the past year due to cough and mucous production 0 number of lifetime hospitalizations, 0 number of lifetime intubations.  Identified Triggers: respiratory illness and cold air Prior PFTs or spirometry: none Previously used therapies: Advair   Current regimen:  Maintenance: none Rescue: Albuterol  2 puffs q4-6 hrs  PRN  Rhinitis:  Started many years ago but post nasal drip is worse this last year.  Symptoms include: nasal congestion, rhinorrhea, and post nasal drainage, cough Occurs seasonally-Spring/Fall  Potential triggers: not sure  Treatments tried:  Zyrtec AM, Singulair  PM Nasonex  2 SEN daily   Previous allergy testing: no History of sinus surgery: no Nonallergic triggers: none   Chronic Cough Post Nasal Drip Was on lisinopril  for many years and in winter started having a cough, treated for PNA with abx.  Stopped lisinopril  and got better.   Cough has improved but still having a lot of post nasal drip and it is to the point that her husband does not sleep in the same room as it wakes him up.  Sometimes forces herself to cough and does a lot of throat clearing  No history of heartburn/reflux and elevates bed and doesn't eat past 7; uses PRN nexium if she eats a late large meal.   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   No chest tightness/dyspnea/wheezing with the cough. Given medrol  dose pack and cough/PND did not improve.    Reviewed:  03/01/2024: seen by Dr Ozell for intermittent asthma and allergic rhinitis, referred to Allergy.   01/30/2024: seen for post nasal drip/cough/rhinitis for over 2 months, failed Nasonex /Zyrtec.  Given medrol  dose pack.  Self discontinued lisinopril .    Past Medical History: Past Medical History:  Diagnosis Date   Anal fissure  related to Lichens Sclerosis   Arthritis    Asthma    Hemorrhoids    History of kidney stones    Hypertension    Hypothyroidism    Medical history non-contributory    Pneumonia    PONV (postoperative nausea and vomiting)    Thyroid  disease    Past Surgical History: Past Surgical History:  Procedure Laterality Date   COLONOSCOPY     had a 2014, 2017   KNEE ARTHROSCOPY WITH MENISCAL REPAIR Right 03/30/2021   Procedure: right knee meniscal root tear repair; arthroscopy;  Surgeon: Addie Cordella Hamilton, MD;  Location: [facility] OR;  Service: Orthopedics;  Laterality: Right;   TOTAL ABDOMINAL HYSTERECTOMY Left 08/30/2002   TAH/RSO/LO cystectomy for bleeding, bilateral cysts   TOTAL KNEE ARTHROPLASTY Left 09/14/2021   Procedure: LEFT TOTAL KNEE ARTHROPLASTY;  Surgeon: Addie Cordella Hamilton, MD;  Location: [facility] OR;  Service: Orthopedics;  Laterality: Left;   TUBAL LIGATION     VULVA /PERINEUM BIOPSY  03/08/2020        Family History: Family History  Problem Relation Age of Onset   Heart disease Mother    Alcohol abuse Mother    Asthma Mother    Depression Mother    Drug abuse Mother    Early death Mother    Hypertension Mother    Thyroid  disease Sister    Diabetes Sister    Asthma Sister    Drug abuse Sister    Hypertension Sister    Psoriasis Sister    Interstitial cystitis Sister    Colon cancer Neg Hx    Esophageal cancer Neg Hx    Pancreatic cancer Neg Hx    Prostate cancer Neg Hx    Rectal cancer Neg Hx     Social History:  Tobacco use/exposure: few years, social smoking in college  Job: nurse  Medication List:  Allergies as of 03/22/2024       Reactions   Lisinopril  Cough   Shingrix [zoster Vac Recomb Adjuvanted]    LICHEN'S FLARE   Sulfonamide Derivatives Rash        Medication List        Accurate as of March 22, 2024  3:13 PM. If you have any questions, ask your nurse or doctor.          albuterol  108 (90 Base) MCG/ACT inhaler Commonly known as: VENTOLIN  HFA Inhale 2 puffs into the lungs every 4 (four) hours as needed for shortness of breath or wheezing. What changed: reasons to take this Changed by: Arleta SHAUNNA Blanch   aspirin  81 MG chewable tablet Chew 1 tablet (81 mg total) by mouth 2 (two) times daily.   clobetasol  cream 0.05 % Commonly known as: TEMOVATE  Apply 1 Application topically daily as needed (lichen sclerosus).   cyclobenzaprine  10 MG tablet Commonly known as: FLEXERIL  Take 1 tablet (10 mg total) by mouth 3 (three) times daily as  needed for muscle spasms.   docusate sodium  100 MG capsule Commonly known as: COLACE Take 200 mg by mouth in the morning.   fluticasone -salmeterol 115-21 MCG/ACT inhaler Commonly known as: Advair  HFA Inhale 2 puffs into the lungs 2 (two) times daily. Started by: Arleta SHAUNNA Blanch   hydrochlorothiazide  25 MG tablet Commonly known as: HYDRODIURIL  Take 1 tablet (25 mg total) by mouth daily.   ibuprofen  600 MG tablet Commonly known as: ADVIL  Take 600 mg by mouth every 6 (six) hours as needed (night for neck).   levothyroxine  112 MCG tablet Commonly known as:  SYNTHROID  Take 1 tablet (112 mcg total) by mouth daily before breakfast.   montelukast  10 MG tablet Commonly known as: SINGULAIR  Take 1 tablet (10 mg total) by mouth at bedtime.   tacrolimus  0.1 % ointment Commonly known as: PROTOPIC  Apply 1 Application topically to affected area daily.   tacrolimus  0.1 % ointment Commonly known as: PROTOPIC  Apply to affected areas daily         REVIEW OF SYSTEMS: Pertinent positives and negatives discussed in HPI.   Objective:   Physical Exam: Pulse (!) 106   Temp 98.1 F (36.7 C) (Temporal)   Ht 5' 5 (1.651 m)   Wt 222 lb 1.6 oz (100.7 kg)   SpO2 97%   BMI 36.96 kg/m  Body mass index is 36.96 kg/m. GEN: alert, well developed HEENT: clear conjunctiva, nose with + mild inferior turbinate hypertrophy, pink nasal mucosa, no rhinorrhea, + cobblestoning HEART: regular rate and rhythm, no murmur LUNGS: clear to auscultation bilaterally, no coughing, unlabored respiration ABDOMEN: soft, non distended  SKIN: no rashes or lesions  Spirometry:  Tracings reviewed. Her effort: It was hard to get consistent efforts and there is a question as to whether this reflects a maximal maneuver. FVC: 2.03L, 62% predicted FEV1: 1.57L, 62% predicted FEV1/FVC ratio: 77% Interpretation: Spirometry consistent with possible restrictive disease.  Please see scanned spirometry results for  details.  Assessment:   1. Chronic cough   2. Other allergic rhinitis   3. Mild intermittent asthma without complication   4. Post-nasal drip     Plan/Recommendations:  Mild Intermittent Asthma: - Spirometry today with possible restriction, no obstruction, suboptimal effort and also possible related to obesity. Discussed low suspicion cough is from uncontrolled asthma  - With respiratory failure or illness, start Advair  115-21mcg 2 puffs twice daily.   - Rescue inhaler: Albuterol  2 puffs via spacer or 1 vial via nebulizer every 4-6 hours as needed for respiratory symptoms of cough, shortness of breath, or wheezing Asthma control goals:  Full participation in all desired activities (may need albuterol  before activity) Albuterol  use two times or less a week on average (not counting use with activity) Cough interfering with sleep two times or less a month Oral steroids no more than once a year No hospitalizations   Other Allergic Rhinitis: - Due to turbinate hypertrophy, seasonal symptoms, chronic cough with post nasal drip and unresponsive to over the counter meds, will perform skin testing to identify aeroallergen triggers.   - Use nasal saline rinses before nose sprays such as with Neilmed Sinus Rinse.  Use distilled water .   - Use Nasonex  2 sprays each nostril daily. Aim upward and outward. - Use Zyrtec 10 mg daily.  - Use Singulair  10mg  daily. Stop if there are any mood/behavioral changes.  Chronic Cough - Avoid frequent throat clearing.  Take small sips of water  or use mint.   - Related to post nasal drip. - Will keep silent reflux/LPR in differential.   - Will consider voice therapy/ENT referral   Follow up: 9/12 at 2 PM for skin testing 1-55, IDs okay    Arleta Blanch, MD [facility] 

## 2024-03-22 NOTE — Patient Instructions (Addendum)
 Mild Intermittent Asthma: - With respiratory failure or illness, start Advair  115-21mcg 2 puffs twice daily.   - Rescue inhaler: Albuterol  2 puffs via spacer or 1 vial via nebulizer every 4-6 hours as needed for respiratory symptoms of cough, shortness of breath, or wheezing Asthma control goals:  Full participation in all desired activities (may need albuterol  before activity) Albuterol  use two times or less a week on average (not counting use with activity) Cough interfering with sleep two times or less a month Oral steroids no more than once a year No hospitalizations   Other Allergic Rhinitis Post Nasal Drip  - Use nasal saline rinses before nose sprays such as with Neilmed Sinus Rinse.  Use distilled water .   - Use Nasonex  2 sprays each nostril daily. Aim upward and outward. - Use Zyrtec 10 mg daily.  - Use Singulair  10mg  daily. Stop if there are any mood/behavioral changes.  Chronic Cough - Avoid frequent throat clearing.  Take small sips of water  or use mint.   - Related to post nasal drip. - Will keep silent reflux/LPR in differential. Will also consider voice therapy/ENT   Follow up: 9/12 at 2 PM for skin testing 1-55 Hold all anti-histamines (Xyzal, Allegra, Zyrtec, Claritin, Benadryl, Pepcid) 3 days prior to next visit.

## 2024-03-29 ENCOUNTER — Encounter: Payer: Self-pay | Admitting: Internal Medicine

## 2024-03-29 ENCOUNTER — Ambulatory Visit: Admitting: Internal Medicine

## 2024-03-29 ENCOUNTER — Other Ambulatory Visit (HOSPITAL_COMMUNITY): Payer: Self-pay

## 2024-03-29 DIAGNOSIS — J3089 Other allergic rhinitis: Secondary | ICD-10-CM | POA: Diagnosis not present

## 2024-03-29 MED ORDER — AZELASTINE HCL 0.1 % NA SOLN
2.0000 | Freq: Two times a day (BID) | NASAL | 5 refills | Status: AC
  Filled 2024-03-29: qty 30, 25d supply, fill #0
  Filled 2024-05-05: qty 30, 25d supply, fill #1
  Filled 2024-06-28: qty 30, 25d supply, fill #2
  Filled 2024-08-02: qty 30, 25d supply, fill #3

## 2024-03-29 NOTE — Patient Instructions (Addendum)
 Other Allergic Rhinitis: - Positive skin test 03/2024: - Use nasal saline rinses before nose sprays such as with Neilmed Sinus Rinse.  Use distilled water .   - Use Nasonex  2 sprays each nostril daily. Aim upward and outward. - Use Azelastine  2 sprays each nostril twice daily. Aim upward and outward. - Use Zyrtec 10 mg daily.  - Use Singulair  10mg  daily. Stop if there are any mood/behavioral changes.  Chronic Cough - Avoid frequent throat clearing.  Take small sips of water  or use mint.   - Related to post nasal drip. - Will keep silent reflux/LPR in differential.   - Will consider voice therapy/ENT referral  Mild Intermittent Asthma: - Continue Singulair  10mg  daily.   - With respiratory failure or illness, start Advair  115-18mcg 2 puffs twice daily 1-2 weeks.   - Rescue inhaler: Albuterol  2 puffs via spacer or 1 vial via nebulizer every 4-6 hours as needed for respiratory symptoms of cough, shortness of breath, or wheezing Asthma control goals:  Full participation in all desired activities (may need albuterol  before activity) Albuterol  use two times or less a week on average (not counting use with activity) Cough interfering with sleep two times or less a month Oral steroids no more than once a year No hospitalizations

## 2024-03-29 NOTE — Progress Notes (Signed)
 FOLLOW UP Date of Service/Encounter:  03/29/24   Subjective:  Kaitlyn Kelly (DOB: 30-Jun-1961) is a 63 y.o. female who [facility] for follow up for skin testing.   History obtained from: chart review and patient.  Anti histamines held.   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   Arthritis    Asthma    Hemorrhoids    History of kidney stones    Hypertension    Hypothyroidism    Medical history non-contributory    Pneumonia    PONV (postoperative nausea and vomiting)    Thyroid  disease     Objective:  There were no vitals taken for this visit. There is no height or weight on file to calculate BMI. Physical Exam: GEN: alert, well developed HEENT: clear conjunctiva, MMM LUNGS: unlabored respiration  Skin Testing:  Skin prick testing was placed, which includes aeroallergens/foods, histamine control, and saline control.  Verbal consent was obtained prior to placing test.  Patient tolerated procedure well.  Allergy  testing results were read and interpreted by myself, documented by clinical staff. Adequate positive and negative control.  Positive results to:  Results discussed with patient/family.  Airborne Adult Perc - 03/29/24 1343     Time Antigen Placed 1343    Allergen Manufacturer Jestine    Location Back    Number of Test 55    1. Control-Buffer 50% Glycerol Negative    2. Control-Histamine 3+    3. Bahia Negative    4. French Southern Territories Negative    5. Johnson Negative    6. [place]  Blue Negative    7. Meadow Fescue Negative    8. Perennial Rye Negative    9. Timothy Negative    10. Ragweed Mix Negative    11. Cocklebur Negative    12. Plantain,  English Negative    13. Baccharis Negative    14. Dog Fennel Negative    15. Russian Thistle Negative    16. Lamb's Quarters Negative    17. Sheep Sorrell Negative    18. Rough Pigweed Negative    19.  Marsh Elder, Rough Negative    20. Mugwort, Common Negative    21. Box, Elder Negative    22. Cedar, red Negative    23. Sweet Gum Negative    24. Pecan Pollen Negative    25. Pine Mix Negative    26. Walnut, Black Pollen Negative    27. Red Mulberry Negative    28. Ash Mix Negative    29. Birch Mix Negative    30. Beech American Negative    31. Cottonwood, Guinea-Bissau Negative    32. Hickory, White Negative    33. Maple Mix Negative    34. Oak, Guinea-[place]issau Mix Negative    35. Sycamore Eastern Negative    36. Alternaria Alternata Negative    37. Cladosporium Herbarum Negative    38. Aspergillus Mix Negative    39. Penicillium Mix Negative    40. Bipolaris Sorokiniana (Helminthosporium) Negative    41. Drechslera Spicifera (Curvularia) Negative    42. Mucor Plumbeus Negative    43. Fusarium Moniliforme Negative    44. Aureobasidium Pullulans (pullulara) Negative    45. Rhizopus Oryzae Negative    46. Botrytis Cinera Negative    47. Epicoccum Nigrum Negative    48. Phoma Betae Negative    49. Dust Mite Mix Negative  50. Cat Hair 10,000 BAU/ml Negative    51.  Dog Epithelia Negative    52. Mixed Feathers Negative    53. Horse Epithelia Negative    54. Cockroach, German Negative    55. Tobacco Leaf Negative          Intradermal - 03/29/24 1410     Time Antigen Placed 1410    Allergen Manufacturer Jestine    Location Arm    Number of Test 16    Control Negative    Bahia Negative    French Southern Territories Negative    Johnson Negative    7 Grass Negative    Ragweed Mix Negative    Weed Mix Negative    Tree Mix Negative    Mold 1 Negative    Mold 2 Negative    Mold 3 Negative    Mold 4 Negative    Mite Mix Negative    Cat Negative    Dog Negative    Cockroach Negative           Assessment:   1. Other allergic rhinitis     Plan/Recommendations:  Other Allergic Rhinitis: - Due to turbinate hypertrophy, seasonal symptoms, chronic cough with post nasal drip and unresponsive  to over the counter meds, will perform skin testing to identify aeroallergen triggers. - Positive skin test 03/2024: - Use nasal saline rinses before nose sprays such as with Neilmed Sinus Rinse.  Use distilled water .   - Use Nasonex  2 sprays each nostril daily. Aim upward and outward. - Use Azelastine  2 sprays each nostril twice daily. Aim upward and outward. - Use Zyrtec 10 mg daily.  - Use Singulair  10mg  daily. Stop if there are any mood/behavioral changes.  Chronic Cough - Avoid frequent throat clearing.  Take small sips of water  or use mint.   - Related to post nasal drip. - Will keep silent reflux/LPR in differential.   - Will consider voice therapy/ENT referral  Mild Intermittent Asthma: - Continue Singulair  10mg  daily.   - With respiratory failure or illness, start Advair  115-85mcg 2 puffs twice daily 1-2 weeks.   - Rescue inhaler: Albuterol  2 puffs via spacer or 1 vial via nebulizer every 4-6 hours as needed for respiratory symptoms of cough, shortness of breath, or wheezing Asthma control goals:  Full participation in all desired activities (may need albuterol  before activity) Albuterol  use two times or less a week on average (not counting use with activity) Cough interfering with sleep two times or less a month Oral steroids no more than once a year No hospitalizations      Return in about 3 months (around 06/28/2024).  Arleta Blanch, MD [facility] 

## 2024-05-05 ENCOUNTER — Other Ambulatory Visit: Payer: Self-pay | Admitting: Family Medicine

## 2024-05-05 ENCOUNTER — Other Ambulatory Visit (HOSPITAL_COMMUNITY): Payer: Self-pay

## 2024-05-05 DIAGNOSIS — I10 Essential (primary) hypertension: Secondary | ICD-10-CM

## 2024-05-05 DIAGNOSIS — M542 Cervicalgia: Secondary | ICD-10-CM

## 2024-05-06 ENCOUNTER — Other Ambulatory Visit: Payer: Self-pay

## 2024-05-06 ENCOUNTER — Other Ambulatory Visit (HOSPITAL_COMMUNITY): Payer: Self-pay

## 2024-05-06 MED ORDER — CYCLOBENZAPRINE HCL 10 MG PO TABS
10.0000 mg | ORAL_TABLET | Freq: Three times a day (TID) | ORAL | 2 refills | Status: AC | PRN
  Filled 2024-05-06: qty 90, 30d supply, fill #0
  Filled 2024-06-04: qty 90, 30d supply, fill #1
  Filled 2024-08-02: qty 90, 30d supply, fill #2

## 2024-05-06 MED ORDER — LEVOTHYROXINE SODIUM 112 MCG PO TABS
112.0000 ug | ORAL_TABLET | Freq: Every day | ORAL | 0 refills | Status: AC
  Filled 2024-05-06 – 2024-06-28 (×2): qty 90, 90d supply, fill #0

## 2024-06-04 ENCOUNTER — Other Ambulatory Visit: Payer: Self-pay | Admitting: Family Medicine

## 2024-06-04 DIAGNOSIS — R053 Chronic cough: Secondary | ICD-10-CM

## 2024-06-05 ENCOUNTER — Other Ambulatory Visit (HOSPITAL_COMMUNITY): Payer: Self-pay

## 2024-06-05 ENCOUNTER — Other Ambulatory Visit: Payer: Self-pay

## 2024-06-05 MED ORDER — MONTELUKAST SODIUM 10 MG PO TABS
10.0000 mg | ORAL_TABLET | Freq: Every day | ORAL | 3 refills | Status: AC
  Filled 2024-06-05: qty 30, 30d supply, fill #0
  Filled 2024-07-14: qty 30, 30d supply, fill #1

## 2024-06-28 ENCOUNTER — Other Ambulatory Visit (HOSPITAL_COMMUNITY): Payer: Self-pay

## 2024-06-28 ENCOUNTER — Other Ambulatory Visit: Payer: Self-pay

## 2024-06-28 ENCOUNTER — Ambulatory Visit: Admitting: Internal Medicine

## 2024-06-28 VITALS — BP 130/80 | HR 94 | Temp 98.2°F | Ht 65.25 in | Wt 228.8 lb

## 2024-06-28 DIAGNOSIS — R053 Chronic cough: Secondary | ICD-10-CM | POA: Diagnosis not present

## 2024-06-28 DIAGNOSIS — J4530 Mild persistent asthma, uncomplicated: Secondary | ICD-10-CM | POA: Diagnosis not present

## 2024-06-28 DIAGNOSIS — J310 Chronic rhinitis: Secondary | ICD-10-CM

## 2024-06-28 NOTE — Progress Notes (Signed)
 FOLLOW UP Date of Service/Encounter:  06/28/2024   Subjective:  Kaitlyn Kelly (DOB: March 22, 1961) is a 63 y.o. female who [facility]/2024 for follow up for chronic rhinitis, chronic cough, asthma.   History obtained from: chart review and patient. Last visit was with me and at the time, testing was negative for aeroallergens. Cough thought to be multifactorial from throat clearing/PND, low suspicion for it being due to uncontrolled asthma.   Reports since last visit, she started taking her Advair  2 puffs and Singulair  daily and started the Nasonex /Azelastine /Zyrtec daily.  Since then, cough was doing very well until the weather change occurred.  Since the start of colder weather, she has had increased cough, congestion and post nasal drainage. She had also stopped taking all her medications since she was doing so well but just recently restarted all the medications and notes some improvement in the cough already.  Denies issues with wheezing/dyspnea. Rare albuterol  use.  No ER/urgent care/oral prednisone  since last visit.  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   Arthritis    Asthma    Hemorrhoids    History of kidney stones    Hypertension    Hypothyroidism    Medical history non-contributory    Pneumonia    PONV (postoperative nausea and vomiting)    Thyroid  disease     Objective:  BP 130/80 (BP Location: Left Arm, Patient Position: Sitting, Cuff Size: Normal)   Pulse 94   Temp 98.2 F (36.8 C) (Temporal)   Ht 5' 5.25 (1.657 m)   Wt 228 lb 12.8 oz (103.8 kg)   SpO2 99%   BMI 37.78 kg/m  Body mass index is 37.78 kg/m. Physical Exam: GEN: alert, well developed HEENT: clear conjunctiva, nose with mild inferior turbinate hypertrophy, pink nasal mucosa, no  rhinorrhea, + cobblestoning HEART: regular rate and rhythm, no murmur LUNGS: clear to auscultation  bilaterally, no coughing, unlabored respiration SKIN: no rashes or lesions  Spirometry:  Tracings reviewed. Her effort: It was hard to get consistent efforts and there is a question as to whether this reflects a maximal maneuver. FVC: 2.19L, 68% predicted  FEV1: 1.5L, 59% predicted FEV1/FVC ratio: 68% Interpretation: Spirometry consistent with mixed obstructive and restrictive disease.  Please see scanned spirometry results for details.  Assessment:   1. Chronic rhinitis   2. Chronic cough   3. Mild persistent asthma without complication     Plan/Recommendations:   Chronic Rhinitis - Uncontrolled PND is likely causing the cough too.  Restart rhinitis medications.  - Positive skin test 03/2024: none  - Use nasal saline rinses before nose sprays such as with Neilmed Sinus Rinse.  Use distilled water .   - Use Nasonex  2 sprays each nostril daily. Aim upward and outward. - Use Azelastine  2 sprays each nostril twice daily. Aim upward and outward. - Use Zyrtec 10 mg daily.    Chronic Cough - Avoid frequent throat clearing.  Take small sips of water  or use mint.   - Related to post nasal drip. - Will keep silent reflux/LPR in differential.   - Will consider voice therapy/ENT referral   Mild Persistent Asthma: - Some coughing, improved with Advair , discussed restarting Advair  like she has already done and to continue it.  Spirometry with obstruction and possible restriction.  - Continue Singulair  10mg  daily.   - Continue Advair  115-58mcg 2 puffs daily.  If  symptoms worsen or remain uncontrolled, increase to twice daily.   - Rescue inhaler: Albuterol  2 puffs via spacer or 1 vial via nebulizer every 4-6 hours as needed for respiratory symptoms of cough, shortness of breath, or wheezing Asthma control goals:  Full participation in all desired activities (may need albuterol  before activity) Albuterol  use two times or less a week on average (not counting use with activity) Cough  interfering with sleep two times or less a month Oral steroids no more than once a year No hospitalizations    Return in about 4 months (around 10/27/2024).  Arleta Blanch, MD [facility] 

## 2024-06-28 NOTE — Patient Instructions (Addendum)
 Chronic Rhinitis - Positive skin test 03/2024: none  - Use nasal saline rinses before nose sprays such as with Neilmed Sinus Rinse.  Use distilled water .   - Use Nasonex  2 sprays each nostril daily. Aim upward and outward. - Use Azelastine  2 sprays each nostril twice daily. Aim upward and outward. - Use Zyrtec 10 mg daily.    Chronic Cough - Avoid frequent throat clearing.  Take small sips of water  or use mint.   - Related to post nasal drip. - Will keep silent reflux/LPR in differential.   - Will consider voice therapy/ENT referral   Mild Persistent Asthma: - Continue Singulair  10mg  daily.   - Continue Advair  115-68mcg 2 puffs daily.  If symptoms worsen or remain uncontrolled, increase to twice daily.   - Rescue inhaler: Albuterol  2 puffs via spacer or 1 vial via nebulizer every 4-6 hours as needed for respiratory symptoms of cough, shortness of breath, or wheezing Asthma control goals:  Full participation in all desired activities (may need albuterol  before activity) Albuterol  use two times or less a week on average (not counting use with activity) Cough interfering with sleep two times or less a month Oral steroids no more than once a year No hospitalizations

## 2024-06-28 NOTE — Addendum Note (Signed)
 Addended by: Peta Peachey on: 06/28/2024 04:15 PM   Modules accepted: Orders

## 2024-07-16 ENCOUNTER — Encounter: Payer: Self-pay | Admitting: Internal Medicine

## 2024-07-16 ENCOUNTER — Other Ambulatory Visit (HOSPITAL_COMMUNITY): Payer: Self-pay

## 2024-07-16 ENCOUNTER — Encounter: Payer: Self-pay | Admitting: Family Medicine

## 2024-07-16 ENCOUNTER — Other Ambulatory Visit: Payer: Self-pay | Admitting: Internal Medicine

## 2024-07-16 DIAGNOSIS — J45909 Unspecified asthma, uncomplicated: Secondary | ICD-10-CM

## 2024-07-16 DIAGNOSIS — Z23 Encounter for immunization: Secondary | ICD-10-CM

## 2024-07-17 ENCOUNTER — Encounter: Payer: Self-pay | Admitting: Internal Medicine

## 2024-07-19 ENCOUNTER — Other Ambulatory Visit (HOSPITAL_COMMUNITY): Payer: Self-pay

## 2024-07-19 MED ORDER — RSVPREF3 VAC RECOMB ADJUVANTED 120 MCG/0.5ML IM SUSR
0.5000 mL | Freq: Once | INTRAMUSCULAR | 0 refills | Status: DC
  Filled 2024-07-19: qty 0.5, 1d supply, fill #0

## 2024-07-19 NOTE — Telephone Encounter (Signed)
 Ok to write the letter for patient that it is medically necessary due to her history of asthma

## 2024-07-24 ENCOUNTER — Other Ambulatory Visit (HOSPITAL_COMMUNITY): Payer: Self-pay

## 2024-07-24 MED ORDER — RSVPREF3 VAC RECOMB ADJUVANTED 120 MCG/0.5ML IM SUSR
0.5000 mL | Freq: Once | INTRAMUSCULAR | 0 refills | Status: AC
  Filled 2024-07-24 – 2024-08-09 (×2): qty 1, 1d supply, fill #0

## 2024-07-24 NOTE — Addendum Note (Signed)
 Addended by: OZELL HERON HERO on: 07/24/2024 01:31 PM   Modules accepted: Orders

## 2024-08-02 ENCOUNTER — Other Ambulatory Visit: Payer: Self-pay | Admitting: Family Medicine

## 2024-08-02 ENCOUNTER — Other Ambulatory Visit (HOSPITAL_COMMUNITY): Payer: Self-pay

## 2024-08-02 ENCOUNTER — Other Ambulatory Visit: Payer: Self-pay

## 2024-08-02 DIAGNOSIS — I10 Essential (primary) hypertension: Secondary | ICD-10-CM

## 2024-08-02 MED ORDER — HYDROCHLOROTHIAZIDE 25 MG PO TABS
25.0000 mg | ORAL_TABLET | Freq: Every day | ORAL | 0 refills | Status: AC
  Filled 2024-08-02: qty 90, 90d supply, fill #0

## 2024-08-04 ENCOUNTER — Other Ambulatory Visit (HOSPITAL_COMMUNITY): Payer: Self-pay

## 2024-08-09 ENCOUNTER — Other Ambulatory Visit (HOSPITAL_COMMUNITY): Payer: Self-pay

## 2024-10-04 ENCOUNTER — Ambulatory Visit: Admitting: Family Medicine

## 2024-11-01 ENCOUNTER — Ambulatory Visit: Admitting: Internal Medicine
